# Patient Record
Sex: Female | Born: 1970
Health system: Southern US, Community
[De-identification: ages and names within clinical notes are randomized; demographics above are authoritative.]

## PROBLEM LIST (undated history)

## (undated) DIAGNOSIS — J189 Pneumonia, unspecified organism: Secondary | ICD-10-CM

## (undated) DIAGNOSIS — G4730 Sleep apnea, unspecified: Secondary | ICD-10-CM

## (undated) DIAGNOSIS — E119 Type 2 diabetes mellitus without complications: Secondary | ICD-10-CM

## (undated) DIAGNOSIS — I10 Essential (primary) hypertension: Secondary | ICD-10-CM

## (undated) HISTORY — DX: Essential (primary) hypertension: I10

## (undated) HISTORY — PX: KNEE ARTHROSCOPY: SHX127

## (undated) HISTORY — PX: BARIATRIC SURGERY: SHX1103

## (undated) HISTORY — PX: TONSILLECTOMY: SUR1361

## (undated) HISTORY — DX: Type 2 diabetes mellitus without complications: E11.9

---

## 1999-03-03 ENCOUNTER — Encounter: Payer: Self-pay | Admitting: Orthopedic Surgery

## 1999-03-03 ENCOUNTER — Ambulatory Visit (HOSPITAL_COMMUNITY): Admission: RE | Admit: 1999-03-03 | Discharge: 1999-03-03 | Payer: Self-pay | Admitting: Orthopedic Surgery

## 2002-09-22 ENCOUNTER — Other Ambulatory Visit: Admission: RE | Admit: 2002-09-22 | Discharge: 2002-09-22 | Payer: Self-pay | Admitting: Obstetrics and Gynecology

## 2002-09-28 ENCOUNTER — Ambulatory Visit (HOSPITAL_COMMUNITY): Admission: RE | Admit: 2002-09-28 | Discharge: 2002-09-28 | Payer: Self-pay | Admitting: Obstetrics and Gynecology

## 2002-09-28 ENCOUNTER — Encounter: Payer: Self-pay | Admitting: Obstetrics and Gynecology

## 2008-11-30 ENCOUNTER — Inpatient Hospital Stay (HOSPITAL_COMMUNITY): Admission: RE | Admit: 2008-11-30 | Discharge: 2008-11-30 | Payer: Self-pay | Admitting: Orthopedic Surgery

## 2008-12-20 ENCOUNTER — Encounter: Admission: RE | Admit: 2008-12-20 | Discharge: 2008-12-20 | Payer: Self-pay | Admitting: Orthopedic Surgery

## 2010-03-03 ENCOUNTER — Encounter: Payer: Self-pay | Admitting: Family Medicine

## 2010-05-15 LAB — BASIC METABOLIC PANEL WITH GFR
BUN: 11 mg/dL (ref 6–23)
CO2: 28 meq/L (ref 19–32)
Calcium: 9.7 mg/dL (ref 8.4–10.5)
Chloride: 103 meq/L (ref 96–112)
Creatinine, Ser: 0.68 mg/dL (ref 0.4–1.2)
GFR calc non Af Amer: >60 mL/min
Glucose, Bld: 154 mg/dL — ABNORMAL HIGH (ref 70–99)
Potassium: 4.3 meq/L (ref 3.5–5.1)
Sodium: 139 meq/L (ref 135–145)

## 2010-05-15 LAB — CBC
HCT: 37.5 % (ref 36.0–46.0)
Hemoglobin: 12.3 g/dL (ref 12.0–15.0)
MCV: 75.6 fL — ABNORMAL LOW (ref 78.0–100.0)
WBC: 11.3 10*3/uL — ABNORMAL HIGH (ref 4.0–10.5)

## 2010-05-15 LAB — GLUCOSE, CAPILLARY
Glucose-Capillary: 166 mg/dL — ABNORMAL HIGH (ref 70–99)
Glucose-Capillary: 173 mg/dL — ABNORMAL HIGH (ref 70–99)

## 2010-05-15 LAB — HCG, SERUM, QUALITATIVE: Preg, Serum: NEGATIVE

## 2011-08-03 ENCOUNTER — Other Ambulatory Visit: Payer: Self-pay | Admitting: Internal Medicine

## 2011-08-03 DIAGNOSIS — Z1231 Encounter for screening mammogram for malignant neoplasm of breast: Secondary | ICD-10-CM

## 2011-08-11 ENCOUNTER — Ambulatory Visit
Admission: RE | Admit: 2011-08-11 | Discharge: 2011-08-11 | Disposition: A | Discharge status: Home / Self Care | Payer: BC Managed Care – PPO | Source: Ambulatory Visit | Attending: Internal Medicine | Admitting: Internal Medicine

## 2011-08-11 DIAGNOSIS — Z1231 Encounter for screening mammogram for malignant neoplasm of breast: Secondary | ICD-10-CM

## 2011-10-08 ENCOUNTER — Ambulatory Visit (INDEPENDENT_AMBULATORY_CARE_PROVIDER_SITE_OTHER): Payer: BC Managed Care – PPO | Admitting: Emergency Medicine

## 2011-10-08 VITALS — BP 136/88 | HR 91 | Temp 97.6°F | Resp 16 | Ht 60.75 in | Wt 398.0 lb

## 2011-10-08 DIAGNOSIS — R0602 Shortness of breath: Secondary | ICD-10-CM

## 2011-10-08 DIAGNOSIS — F411 Generalized anxiety disorder: Secondary | ICD-10-CM

## 2011-10-08 DIAGNOSIS — F419 Anxiety disorder, unspecified: Secondary | ICD-10-CM

## 2011-10-08 MED ORDER — ALPRAZOLAM 0.5 MG PO TABS: ORAL_TABLET | ORAL | Status: DC

## 2011-10-08 NOTE — Progress Notes (Signed)
  Subjective:    Patient ID: Evelyn Wade, female    DOB: 1971-01-27, 41 y.o.   MRN: [id number]  HPI yesterday felt like when she would breath in couldn't get air. Felt like heart was beating harder but not faster. No leg pain or swelling. No recent travels. Has not had these sxs before. Does have sleep apnea and uses CPAP at night. Pulse was 80 last night. Has been exercising about a month and this morning it was harder to breath in but not out. Has lost 35 lbs since June.    Review of Systems     Objective:   Physical Exam patient does not appear in any distress. Neck is supple. Chest is clear to both auscultation and percussion. Cardiac exam reveals a regular rate without murmurs rubs or gallops. Calves are nontender.  EKG no acute changes      Assessment & Plan:  Not quite sure what the source of her episode last night was. It sounds more like an anxiety attack with her inability to take air in red been having wheezing and trouble expelling air. She does have a history of obstructive sleep apnea and is on the CPAP sheen. Her EKG which was done here is normal. Don't think any further workup is indicated at present unless this is a recurrent problem.

## 2011-10-08 NOTE — Patient Instructions (Signed)
Please return to clinic if you develop worsening symptoms or any recurrence of your symptoms. He is okay to take a Xanax if you feel stress coming on.

## 2011-10-08 NOTE — Progress Notes (Deleted)
  Subjective:    Patient ID: Evelyn Wade, female    DOB: 08/10/1970, 41 y.o.   MRN: [id number]  HPI    Review of Systems     Objective:   Physical Exam        Assessment & Plan:

## 2012-01-19 ENCOUNTER — Ambulatory Visit (INDEPENDENT_AMBULATORY_CARE_PROVIDER_SITE_OTHER): Payer: BC Managed Care – PPO | Admitting: Family Medicine

## 2012-01-19 VITALS — BP 119/81 | HR 74 | Temp 98.5°F | Resp 17 | Ht 61.0 in | Wt 367.0 lb

## 2012-01-19 DIAGNOSIS — H109 Unspecified conjunctivitis: Secondary | ICD-10-CM

## 2012-01-19 DIAGNOSIS — H5789 Other specified disorders of eye and adnexa: Secondary | ICD-10-CM

## 2012-01-19 DIAGNOSIS — Z8669 Personal history of other diseases of the nervous system and sense organs: Secondary | ICD-10-CM

## 2012-01-19 MED ORDER — CIPROFLOXACIN HCL 0.3 % OP SOLN: OPHTHALMIC | Status: DC

## 2012-01-19 NOTE — Patient Instructions (Signed)
Call your eye doctor tomorrow morning to be seen tomorrow as you have had a history of increased pressure and iritis. We will start an antibiotic today to cover for possible conjunctivitis, but your eye doctor can change this tomorrow if necessary. No contact lenses until authorized by your eye specialist.   Return to the clinic or go to the nearest emergency room if any of your symptoms worsen or new symptoms occur.

## 2012-01-19 NOTE — Progress Notes (Signed)
Subjective:    Patient ID: Evelyn Wade, female    DOB: 07-03-1970, 41 y.o.   MRN: [id number]  HPI Evelyn Wade is a 41 y.o. female  L eye redness - started with puffy area on upper lid 4 days ago, red yesterday, feels irritated. Blurry vision later yesterday.  More sore and blurry today.   Contacts and glasses - wears CL's about 3 days per week. Did wear them yesterday - then redness and irritation started. 36 week old contacts.  Took these out at 5pm.  No hx of ulceration. Usually does not wear same contacts more than few weeks. Had slight elevation in pressure in eyes at last optho visit 6 months ago.  Next appt in January.  Hx of iritis few years ago - unknown treatment other than steroid drops.   Optho: [facility] in past, more recently Dr. Mitzi Davenport.   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      Review of Systems  Constitutional: Negative for fever and chills.  HENT: Negative for congestion and rhinorrhea.   Eyes: Positive for photophobia (slight on right.), pain, redness and visual disturbance (slightly blurry on right.). Negative for discharge.  Respiratory: Negative for cough.        Objective:   Physical Exam  Vitals reviewed. Constitutional: She is oriented to person, place, and time. She appears well-developed and well-nourished. No distress.  HENT:  Head: Normocephalic and atraumatic.  Right Ear: Hearing, tympanic membrane, external ear and ear canal normal.  Left Ear: Hearing, tympanic membrane, external ear and ear canal normal.  Nose: Nose normal.  Mouth/Throat: Oropharynx is clear and moist. No oropharyngeal exudate.  Eyes: EOM and lids are normal. Pupils are equal, round, and reactive to light. No foreign bodies found. Left conjunctiva is injected. Left conjunctiva has no hemorrhage. Left eye exhibits normal extraocular motion.       Left eye with scleral injection. No visible crusting. Anterior chamber clear,  Proparacaine 2 gtts applied,with less soreness after gtts.  Lids everted, no foreign body identified. Fluorescein applied, no uptake.  Flushed with sterile water, no complications. No appreciable pain or discrepancy in firmness with closed eye palpation.    Cardiovascular: Normal rate, regular rhythm, normal heart sounds and intact distal pulses.   No murmur heard. Pulmonary/Chest: Effort normal and breath sounds normal. No respiratory distress. She has no wheezes. She has no rhonchi.  Neurological: She is alert and oriented to person, place, and time.  Skin: Skin is warm and dry. No rash noted.  Psychiatric: She has a normal mood and affect. Her behavior is normal.  reviewed vision testing.     Assessment & Plan:  Evelyn Wade is a 41 y.o. female 1. Conjunctivitis  ciprofloxacin (CILOXAN) 0.3 % ophthalmic solution  2. Red eye  ciprofloxacin (CILOXAN) 0.3 % ophthalmic solution  3. Hx of iritis     Hx of iritis and slight elevation of IOP prior.  Will have her call her eye specialist tomorrow morning to be seen.  No contact lenses at this point.  Cover for possible conjunctivitis with ciloxan for now.   Patient Instructions  Call your eye doctor tomorrow morning to be seen tomorrow as you have had a history of increased pressure and iritis. We will start an antibiotic today to cover for possible conjunctivitis, but your eye doctor can change this tomorrow if necessary. No contact lenses until authorized by your eye specialist.   Return to the clinic or go to the nearest emergency room if any of your  symptoms worsen or new symptoms occur.

## 2012-03-17 ENCOUNTER — Ambulatory Visit: Payer: BC Managed Care – PPO | Admitting: Obstetrics and Gynecology

## 2012-03-17 ENCOUNTER — Encounter: Payer: Self-pay | Admitting: Obstetrics and Gynecology

## 2012-03-17 ENCOUNTER — Other Ambulatory Visit: Payer: Self-pay

## 2012-03-17 VITALS — BP 140/90 | HR 72 | Ht 61.0 in | Wt 377.0 lb

## 2012-03-17 DIAGNOSIS — Z124 Encounter for screening for malignant neoplasm of cervix: Secondary | ICD-10-CM

## 2012-03-17 LAB — POCT URINE PREGNANCY: Preg Test, Ur: NEGATIVE

## 2012-03-17 MED ORDER — MEDROXYPROGESTERONE ACETATE 10 MG PO TABS: 10.0000 mg | ORAL_TABLET | Freq: Every day | ORAL | Status: DC

## 2012-03-17 MED ORDER — LABETALOL HCL 100 MG PO TABS: 100.0000 mg | ORAL_TABLET | Freq: Two times a day (BID) | ORAL | Status: DC

## 2012-03-17 NOTE — Progress Notes (Signed)
Last Pap: 09/02/10 WNL: Yes Regular Periods:no Contraception: none  Monthly Breast exam:yes somewhat Tetanus<14yrs:yes Nl.Bladder Function:yes Daily BMs:yes Healthy Diet:yes Calcium:no Mammogram:yes Date of Mammogram: 08/2011 Exercise:yes Have often Exercise: 3-4 days per week  Seatbelt: yes Abuse at home: no Stressful work:no Sigmoid-colonoscopy: n/a Bone Density: No PCP: Dr. Dorothyann Peng Change in PMH: no changes Change in [facility]: no changes BP 140/90  Pulse 72  Ht 5\' 1"  (1.549 m)  Wt 377 lb (171.006 kg)  BMI 71.23 kg/m2  LMP 02/10/2012 Pt with complaints:yes and pt desires pregnancy.  She has  Menses q 4- 6 months Physical Examination: General appearance - alert, well appearing, and in no distress Mental status - normal mood, behavior, speech, dress, motor activity, and thought processes Neck - supple, no significant adenopathy,  thyroid exam: thyroid is normal in size without nodules or tenderness Chest - clear to auscultation, no wheezes, rales or rhonchi, symmetric air entry Heart - normal rate and regular rhythm Abdomen - soft, nontender, nondistended, no masses or organomegaly Breasts - breasts appear normal, no suspicious masses, no skin or nipple changes or axillary nodes Pelvic - normal external genitalia, vulva, vagina, cervix, uterus and adnexa Rectal - rectal exam not indicated Back exam - full range of motion, no tenderness, palpable spasm or pain on motion Neurological - alert, oriented, normal speech, no focal findings or movement disorder noted Musculoskeletal - no joint tenderness, deformity or swelling Extremities - no edema, redness or tenderness in the calves or thighs Skin - normal coloration and turgor, no rashes, no suspicious skin lesions noted Routine exam Pap sent yes Mammogram due no none used for contraception RT to for day 21 progesterone Call with menses to schedule HSG and d21 progesterone SA done D/c lisionpril and start labetalol.   Check BP@NV  pnv given to pt

## 2012-03-17 NOTE — Patient Instructions (Addendum)
Take provera for five days Call with your menses to schedule HSG and Day 21 labs Schedule a day 35 visit [place] extension [phone] ext [phone]

## 2012-03-17 NOTE — Addendum Note (Signed)
Addended by: Rolla Plate on: 03/17/2012 04:37 PM   Modules accepted: Orders

## 2012-03-18 LAB — PAP IG W/ RFLX HPV ASCU

## 2012-03-23 ENCOUNTER — Telehealth: Payer: Self-pay

## 2012-03-23 MED ORDER — METRONIDAZOLE 500 MG PO TABS: 500.0000 mg | ORAL_TABLET | Freq: Two times a day (BID) | ORAL | Status: DC

## 2012-03-23 NOTE — Telephone Encounter (Signed)
Spoke wit pt informed pap wnl showed bv rx sent to pharm pt voice understanding

## 2012-03-23 NOTE — Telephone Encounter (Signed)
Message copied by Rolla Plate on Wed Mar 23, 2012 10:44 AM ------      Message from: Jaymes Graff      Created: Wed Mar 23, 2012 12:12 AM       Please tell pt BV was found on her pap smear.  She can be treated with either Metrogel one applicator in vagina QHS for five nights or Flagyl 500mg  one tablet twice a day for seven days. ------

## 2012-03-28 ENCOUNTER — Telehealth: Payer: Self-pay

## 2012-03-28 ENCOUNTER — Other Ambulatory Visit: Payer: Self-pay

## 2012-03-28 DIAGNOSIS — N970 Female infertility associated with anovulation: Secondary | ICD-10-CM

## 2012-03-28 NOTE — Telephone Encounter (Signed)
Message copied by Rolla Plate on Mon Mar 28, 2012 11:23 AM ------      Message from: Jaymes Graff      Created: Wed Mar 23, 2012  4:23 PM       Please tell pt BV was found on her pap smear.  She can be treated with either Metrogel one applicator in vagina QHS for five nights or Flagyl 500mg  one tablet twice a day for seven days. ------

## 2012-03-28 NOTE — Telephone Encounter (Signed)
rx sent to pharm

## 2012-03-28 NOTE — Telephone Encounter (Signed)
Spoke with pt rgd msg pt started cycle today pt schd d21 progesterone 04/18/12 at 4:oo HSG 2//24/14 at 8:[facility] pt voice understanding

## 2012-04-04 ENCOUNTER — Ambulatory Visit (HOSPITAL_COMMUNITY)
Admission: RE | Admit: 2012-04-04 | Discharge: 2012-04-04 | Disposition: A | Discharge status: Home / Self Care | Payer: BC Managed Care – PPO | Source: Ambulatory Visit | Attending: Obstetrics and Gynecology | Admitting: Obstetrics and Gynecology

## 2012-04-04 DIAGNOSIS — N979 Female infertility, unspecified: Secondary | ICD-10-CM | POA: Insufficient documentation

## 2012-04-04 DIAGNOSIS — N970 Female infertility associated with anovulation: Secondary | ICD-10-CM

## 2012-04-04 MED ORDER — IOHEXOL 300 MG/ML  SOLN
20.0000 mL | Freq: Once | INTRAMUSCULAR | Status: AC | PRN
  Administered 2012-04-04: 20 mL

## 2012-04-05 ENCOUNTER — Telehealth: Payer: Self-pay

## 2012-04-05 NOTE — Telephone Encounter (Signed)
Message copied by Rolla Plate on Tue Apr 05, 2012  2:53 PM ------      Message from: Jaymes Graff      Created: Tue Apr 05, 2012 12:29 PM       Please tell pt I will review this study with her at her next visit ------

## 2012-04-05 NOTE — Telephone Encounter (Signed)
Spoke with pt rgd results pt has appt 04/19/12 at 11:00 to discuss results pt voice understanding

## 2012-04-18 ENCOUNTER — Other Ambulatory Visit: Payer: BC Managed Care – PPO

## 2012-04-18 DIAGNOSIS — N970 Female infertility associated with anovulation: Secondary | ICD-10-CM

## 2012-04-19 ENCOUNTER — Ambulatory Visit: Payer: BC Managed Care – PPO | Admitting: Obstetrics and Gynecology

## 2012-04-19 ENCOUNTER — Encounter: Payer: Self-pay | Admitting: Obstetrics and Gynecology

## 2012-04-19 ENCOUNTER — Telehealth: Payer: Self-pay | Admitting: Obstetrics and Gynecology

## 2012-04-19 VITALS — BP 122/80 | Ht 60.0 in

## 2012-04-19 DIAGNOSIS — N970 Female infertility associated with anovulation: Secondary | ICD-10-CM

## 2012-04-19 LAB — PROGESTERONE: Progesterone: 0.4 ng/mL

## 2012-04-19 MED ORDER — CLOMIPHENE CITRATE 50 MG PO TABS: 50.0000 mg | ORAL_TABLET | Freq: Every day | ORAL | Status: DC

## 2012-04-19 NOTE — Patient Instructions (Signed)
Clomiphene tablets What is this medicine? CLOMIPHENE (KLOE mi feen) is a fertility drug that increases the chance of pregnancy. It helps women ovulate (produce a mature egg) during their cycle. This medicine may be used for other purposes; ask your health care provider or pharmacist if you have questions. What should I tell my health care provider before I take this medicine? They need to know if you have any of these conditions: -adrenal gland disease -blood vessel disease or blood clots -cyst on the ovary -endometriosis -liver disease -ovarian cancer -pituitary gland disease -vaginal bleeding that has not been evaluated -an unusual or allergic reaction to clomiphene, other medicines, foods, dyes, or preservatives -pregnant (should not be used if you are already pregnant) -breast-feeding How should I use this medicine? Take this medicine by mouth with a glass of water. Follow the directions on the prescription label. Take exactly as directed for the exact number of days prescribed. Take your doses at regular intervals. Most women take this medicine for a 5 day period, but the length of treatment may be adjusted. Your doctor will give you a start date for this medication and will give you instructions on proper use. Do not take your medicine more often than directed. Talk to your pediatrician regarding the use of this medicine in children. Special care may be needed. Overdosage: If you think you have taken too much of this medicine contact a poison control center or emergency room at once. NOTE: This medicine is only for you. Do not share this medicine with others. What if I miss a dose? If you miss a dose, take it as soon as you can. If it is almost time for your next dose, take only that dose. Do not take double or extra doses. What may interact with this medicine? -herbal or dietary supplements, like blue cohosh, black cohosh, chasteberry, or DHEA -prasterone This list may not describe  all possible interactions. Give your health care provider a list of all the medicines, herbs, non-prescription drugs, or dietary supplements you use. Also tell them if you smoke, drink alcohol, or use illegal drugs. Some items may interact with your medicine. What should I watch for while using this medicine? Make sure you understand how and when to use this medicine. You need to know when you are ovulating and when to have sexual intercourse. This will increase the chance of a pregnancy. Visit your doctor or health care professional for regular checks on your progress. You may need tests to check the hormone levels in your blood or you may have to use home-urine tests to check for ovulation. Try to keep any appointments. Compared to other fertility treatments, this medicine does not greatly increase your chances of having multiple babies. An increased chance of having twins may occur in roughly 5 out of every 100 women who take this medication. Stop taking this medicine at once and contact your doctor or health care professional if you think you are pregnant. This medicine is not for long-term use. Most women that benefit from this medicine do so within the first three cycles (months). Your doctor or health care professional will monitor your condition. This medicine is usually used for a total of 6 cycles of treatment. You may get drowsy or dizzy. Do not drive, use machinery, or do anything that needs mental alertness until you know how this drug affects you. Do not stand or sit up quickly. This reduces the risk of dizzy or fainting spells. Drinking alcoholic beverages   or smoking tobacco may decrease your chance of becoming pregnant. Limit or stop alcohol and tobacco use during your fertility treatments. What side effects may I notice from receiving this medicine? Side effects that you should report to your doctor or health care professional as soon as possible: -allergic reactions like skin rash,  itching or hives, swelling of the face, lips, or tongue -breathing problems -changes in vision -fluid retention -nausea, vomiting -pelvic pain or bloating -severe abdominal pain -sudden weight gain Side effects that usually do not require medical attention (report to your doctor or health care professional if they continue or are bothersome): -breast discomfort -hot flashes -mild pelvic discomfort -mild nausea This list may not describe all possible side effects. Call your doctor for medical advice about side effects. You may report side effects to FDA at 1-800-FDA-1088. Where should I keep my medicine? Keep out of the reach of children. Store at room temperature between 15 and 30 degrees C (59 and 86 degrees F). Protect from heat, light, and moisture. Throw away any unused medicine after the expiration date. NOTE: This sheet is a summary. It may not cover all possible information. If you have questions about this medicine, talk to your doctor, pharmacist, or health care provider.  2013, [employer]. (05/09/2007 10:21:06 PM)

## 2012-04-19 NOTE — Telephone Encounter (Signed)
Returned pt's call. LM to return call.   

## 2012-04-20 NOTE — Progress Notes (Signed)
Pt and her husband are here to discuss results.  Semen analysis is abnormal and significant for decreased motility and abnormal FP.  The patients labs are c/w anovulation.  Her HSG was normal with  Unknown spill from right tube.   BP 122/80  Ht 5' (1.524 m)  LMP 04/18/2012 Results for orders placed in visit on 04/18/12  PROGESTERONE      Result Value Range   Progesterone 0.4    infertility Anovulation and abnormal SA Referred her husband to urology for evaluation Pt desires to try clomid.  R&B reviewed.  Clomid 50 mg d3-d7 .  Check day 21 progesterone.  Once ovulatory they will consider IUI

## 2012-08-14 ENCOUNTER — Ambulatory Visit (INDEPENDENT_AMBULATORY_CARE_PROVIDER_SITE_OTHER): Payer: BC Managed Care – PPO | Admitting: Internal Medicine

## 2012-08-14 VITALS — BP 160/92 | HR 92 | Temp 98.0°F | Resp 17 | Ht 61.0 in | Wt >= 6400 oz

## 2012-08-14 DIAGNOSIS — E119 Type 2 diabetes mellitus without complications: Secondary | ICD-10-CM

## 2012-08-14 DIAGNOSIS — Z6841 Body Mass Index (BMI) 40.0 and over, adult: Secondary | ICD-10-CM | POA: Insufficient documentation

## 2012-08-14 DIAGNOSIS — K1120 Sialoadenitis, unspecified: Secondary | ICD-10-CM

## 2012-08-14 DIAGNOSIS — R221 Localized swelling, mass and lump, neck: Secondary | ICD-10-CM

## 2012-08-14 DIAGNOSIS — R220 Localized swelling, mass and lump, head: Secondary | ICD-10-CM

## 2012-08-14 LAB — POCT CBC
HCT, POC: 37.9 % (ref 37.7–47.9)
Hemoglobin: 11.5 g/dL — AB (ref 12.2–16.2)
MCH, POC: 23.3 pg — AB (ref 27–31.2)
MCV: 76.7 fL — AB (ref 80–97)
MPV: 7.5 fL (ref 0–99.8)
RBC: 4.94 M/uL (ref 4.04–5.48)
WBC: 9.8 10*3/uL (ref 4.6–10.2)

## 2012-08-14 MED ORDER — HYDROCODONE-ACETAMINOPHEN 5-325 MG PO TABS: 1.0000 | ORAL_TABLET | Freq: Four times a day (QID) | ORAL | Status: DC | PRN

## 2012-08-14 MED ORDER — AMOXICILLIN 500 MG PO CAPS: 1000.0000 mg | ORAL_CAPSULE | Freq: Two times a day (BID) | ORAL | Status: DC

## 2012-08-14 MED ORDER — CEFTRIAXONE SODIUM 1 G IJ SOLR
1.0000 g | INTRAMUSCULAR | Status: DC
  Administered 2012-08-14: 1 g via INTRAMUSCULAR

## 2012-08-14 NOTE — Patient Instructions (Addendum)
Sialadenitis  Sialadenitis is an inflammation (soreness) of the salivary glands. The parotid is the main salivary gland. It lies behind the angle of the jaw below the ear. The saliva produced comes out of a tiny opening (duct) inside the cheek on either side. This is usually at the level of the upper back teeth. If it is swollen, the ear is pushed up and out. This helps tell this condition apart from a simple lymph gland infection (swollen glands) in the same area. Mumps has mostly disappeared since the start of immunization against mumps. Now the most common cause of parotitis is germ (bacterial) infection or inflammation of the lymphatics (the lymph channels). The other major salivary gland is located in the floor of the mouth. Smaller salivary glands are located in the mouth. This includes the:   Lips.   Lining of the mouth.   Pharynx.   Hard palate (front part of the roof of the mouth).  The salivary glands do many things, including:   Lubrication.   Breaking down food.   Production of hormones and antibodies (to protect against germs which may cause illness).   Help with the sense of taste.  ACUTE BACTERIAL SIALADENITIS  This is a sudden inflammatory response to bacterial infection. This causes redness, pain, swelling and tenderness over the infected gland. In the past, it was common in dehydrated and debilitated patients often following an operation. It is now more commonly seen:   After radiotherapy.   In patients with poor immune systems.  Treatment is:   The correction of fluid balance (rehydration).   Medicine that kill germs (antibiotics).   Pain relief.  CHRONIC RECURRENT SIALADENITIS  This refers to repeated episodes of discomfort and swelling of one of the salivary glands. It often occurs after eating. Chronic sialadenitis is usually less painful. It is associated with recurrent enlargement of a salivary gland, often following meals, and typically with an absence of redness. The chronic  form of the disease often is associated with conditions linked to decreased salivary flow, rather than dehydration (loss of body fluids). These conditions include:   A stone, or concretion, formed in the gallbladder, kidneys, or other parts of the body (calculi).   Salivary stasis.   A change in the fluid and electrolyte (the salts in your body fluids) makeup of the gland.  It is treated with:   Gland massage.   Methods to stimulate the flow of saliva, (for example, lemon juice).   Antibiotics if required.  Surgery to remove the gland is possible, but its benefits need to be balanced against risks.   VIRAL SIALADENITIS  Several viruses infect the salivary glands. Some of these include the mumps virus that commonly infects the parotid gland. Other viruses causing problems are:   The HIV virus.   Herpes.   Some of the influenza ("flu") viruses.  RECURRENT SIALADENITIS IN CHILDREN  This condition is thought to be due to swelling or ballooning of the ducts. It results in the same symptoms as acute bacterial parotitis. It is usually caused by germs (bacteria). It is often treated using penicillin. It may get well without treatment. Surgery is usually not required.  TUBERCULOUS SIALADENITIS  The salivary glands may become infected with the same bacteria causing tuberculosis ("TB"). Treatment is with anti-tuberculous antibiotic therapy.  OTHER UNCOMMON CAUSES OF SIALADENITIS    Sjogren's syndrome is a condition in which arthritis is associated with a decrease in activity of the glands of the body that produce saliva   enlargement.  Atypical mycobacteria is a germ similar to tuberculosis. It often infects children. It is often resistant to antibiotic treatment. It may require surgical treatment to remove  the infected salivary gland.  Actinomycosis is an infection of the parotid gland that may also involve the overlying skin. The diagnosis is made by detecting granules of sulphur produced by the bacteria on microscopic examination. Treatment is a prolonged course of penicillin for up to one year.  Nutritional causes include vitamin deficiencies and bulimia.  Diabetes and problems with your thyroid.  Obesity, cirrhosis, and malabsorption are some metabolic causes. HOME CARE INSTRUCTIONS   Apply ice bags every 2 hours for 15-20 minutes, while awake, to the sore gland for 24 hours, then as directed by your caregiver. Place the ice in a plastic bag with a towel around it to prevent frostbite to the skin.  Only take over-the-counter or prescription medicines for pain, discomfort, or fever as directed by your caregiver. SEEK IMMEDIATE MEDICAL CARE IF:   There is increased pain or swelling in your gland that is not controlled with medicine.  An oral temperature above 102 F (38.9 C) develops, not controlled by medicine.  You develop difficulty opening your mouth, swallowing, or speaking. Document Released: 07/18/2001 Document Revised: 04/20/2011 Document Reviewed: 09/12/2007 [facility] Patient Information 2014 [place], [place]. Salivary Gland Infection A salivary gland infection can be caused by a virus, bacteria from the mouth, or a stone. Mumps and other viruses may settle in one or more of the saliva glands. This will result in swelling, pain, and difficulty eating. Bacteria may cause a more severe infection in a salivary gland. A salivary stone blocking the flow of saliva can make this worse. These infections may be related to other medical problems. Some of these are dehydration, recent surgery, poor nutrition, and some medications. TREATMENT  Treatment of a salivary gland infection depends on the cause. Mumps and other virus infections do not require antibiotics. If bacteria cause the  infection, then antibiotics are needed to get rid of the infection. If there is a salivary stone blocking the duct, minor surgery to remove the stone may be needed.  HOME CARE INSTRUCTIONS   Get plenty of rest, increase your fluids, and use warm compresses on the swollen area for 15 to 20 minutes 4 times per day or as often as feels good to you.  Suck on hard candy or chew sugarless gum to promote saliva production.  Only take over-the-counter or prescription medicines for pain, discomfort, or fever as directed by your caregiver. SEEK IMMEDIATE MEDICAL CARE IF:   You have increased swelling or pain or pain not relieved with medications.  You develop chills or a fever.  Any of your problems are getting worse rather than better. Document Released: 03/05/2004 Document Revised: 04/20/2011 Document Reviewed: 01/26/2005 [facility] Patient Information 2014 [place], [place].

## 2012-08-14 NOTE — Progress Notes (Signed)
  Subjective:    Patient ID: Evelyn Wade, female    DOB: 1970/12/13, 42 y.o.   MRN: [id number]  HPI Feels terrible, 3 weeks of right submandibular swelling progressive and pain. No fever, has diabetes and obesity. Txed with keflex for 5 d getting worse, never had this problem before.   Review of Systems     Objective:   Physical Exam  Vitals reviewed. Constitutional: She is oriented to person, place, and time. She appears distressed.  HENT:  Right Ear: External ear normal.  Left Ear: External ear normal.  Nose: Nose normal.  Mouth/Throat: Oropharynx is clear and moist.  Neck: Normal range of motion. No thyromegaly present.  Cardiovascular: Normal rate.   Pulmonary/Chest: Effort normal.  Lymphadenopathy:       Head (left side): Submandibular and preauricular adenopathy present.    She has cervical adenopathy.       Left cervical: Deep cervical adenopathy present.  Neurological: She is alert and oriented to person, place, and time. No cranial nerve deficit. She exhibits normal muscle tone. Coordination normal.  Skin: Skin is warm. No rash noted.  Psychiatric: She has a normal mood and affect.   Results for orders placed in visit on 08/14/12  POCT CBC      Result Value Range   WBC 9.8  4.6 - 10.2 K/uL   Lymph, poc 2.4  0.6 - 3.4   POC LYMPH PERCENT 24.7  10 - 50 %L   MID (cbc) 0.5  0 - 0.9   POC MID % 5.5  0 - 12 %M   POC Granulocyte 6.8  2 - 6.9   Granulocyte percent 69.8  37 - 80 %G   RBC 4.94  4.04 - 5.48 M/uL   Hemoglobin 11.5 (*) 12.2 - 16.2 g/dL   HCT, POC 40.9  81.1 - 47.9 %   MCV 76.7 (*) 80 - 97 fL   MCH, POC 23.3 (*) 27 - 31.2 pg   MCHC 30.3 (*) 31.8 - 35.4 g/dL   RDW, POC 91.4     Platelet Count, POC 478 (*) 142 - 424 K/uL   MPV 7.5  0 - 99.8 fL  GLUCOSE, POCT (MANUAL RESULT ENTRY)      Result Value Range   POC Glucose 115 (*) 70 - 99 mg/dl          Assessment & Plan:  Lymphadenitis versus sialadenitis left submandibular and /or  parotid Rocephin/amoxil/vicodin

## 2012-08-23 ENCOUNTER — Other Ambulatory Visit (INDEPENDENT_AMBULATORY_CARE_PROVIDER_SITE_OTHER): Payer: Self-pay | Admitting: Otolaryngology

## 2012-08-23 DIAGNOSIS — R220 Localized swelling, mass and lump, head: Secondary | ICD-10-CM

## 2012-08-25 ENCOUNTER — Ambulatory Visit
Admission: RE | Admit: 2012-08-25 | Discharge: 2012-08-25 | Disposition: A | Discharge status: Home / Self Care | Payer: BC Managed Care – PPO | Source: Ambulatory Visit | Attending: Otolaryngology | Admitting: Otolaryngology

## 2012-08-25 ENCOUNTER — Other Ambulatory Visit: Payer: BC Managed Care – PPO

## 2012-08-25 ENCOUNTER — Other Ambulatory Visit (INDEPENDENT_AMBULATORY_CARE_PROVIDER_SITE_OTHER): Payer: Self-pay | Admitting: Otolaryngology

## 2012-08-25 DIAGNOSIS — R221 Localized swelling, mass and lump, neck: Secondary | ICD-10-CM

## 2012-08-25 DIAGNOSIS — R220 Localized swelling, mass and lump, head: Secondary | ICD-10-CM

## 2012-08-25 LAB — BUN: BUN: 19 mg/dL (ref 6–23)

## 2012-08-25 LAB — CREATININE, SERUM: Creat: 0.8 mg/dL (ref 0.50–1.10)

## 2012-08-25 MED ORDER — IOHEXOL 300 MG/ML  SOLN
100.0000 mL | Freq: Once | INTRAMUSCULAR | Status: AC | PRN
  Administered 2012-08-25: 100 mL via INTRAVENOUS

## 2012-09-02 ENCOUNTER — Other Ambulatory Visit: Payer: Self-pay | Admitting: Otolaryngology

## 2012-09-05 ENCOUNTER — Encounter (HOSPITAL_COMMUNITY): Payer: Self-pay | Admitting: Pharmacy Technician

## 2012-09-06 ENCOUNTER — Encounter (HOSPITAL_COMMUNITY)
Admission: RE | Admit: 2012-09-06 | Discharge: 2012-09-06 | Disposition: A | Discharge status: Home / Self Care | Payer: BC Managed Care – PPO | Source: Ambulatory Visit | Attending: Otolaryngology | Admitting: Otolaryngology

## 2012-09-06 ENCOUNTER — Encounter (HOSPITAL_COMMUNITY): Payer: Self-pay

## 2012-09-06 ENCOUNTER — Encounter (HOSPITAL_COMMUNITY)
Admission: RE | Admit: 2012-09-06 | Discharge: 2012-09-06 | Disposition: A | Discharge status: Home / Self Care | Payer: BC Managed Care – PPO | Source: Ambulatory Visit | Attending: Anesthesiology | Admitting: Anesthesiology

## 2012-09-06 HISTORY — DX: Sleep apnea, unspecified: G47.30

## 2012-09-06 HISTORY — DX: Pneumonia, unspecified organism: J18.9

## 2012-09-06 LAB — BASIC METABOLIC PANEL
CO2: 24 mEq/L (ref 19–32)
Chloride: 106 mEq/L (ref 96–112)
Creatinine, Ser: 0.64 mg/dL (ref 0.50–1.10)
GFR calc Af Amer: >90 mL/min (ref >90)
Potassium: 4 mEq/L (ref 3.5–5.1)

## 2012-09-06 LAB — CBC
HCT: 33.7 % — ABNORMAL LOW (ref 36.0–46.0)
Hemoglobin: 10.6 g/dL — ABNORMAL LOW (ref 12.0–15.0)
MCV: 73.3 fL — ABNORMAL LOW (ref 78.0–100.0)
RDW: 16.3 % — ABNORMAL HIGH (ref 11.5–15.5)
WBC: 10.1 10*3/uL (ref 4.0–10.5)

## 2012-09-06 NOTE — Pre-Procedure Instructions (Addendum)
Evelyn Wade  09/06/2012   Your procedure is scheduled on: 7.30.14  Report to [facility] at 745 AM.  Call this number if you have problems the morning of surgery: [phone]   Remember:   Do not eat food or drink liquids after midnight.   Take these medicines the morning of surgery with A SIP OF WATER:pain med, labetalol, clindamycin (if doesn't cause nausea when taken on empty stomach)               STOP ibuprofen   No diabetic meds am of surgery   Do not wear jewelry, make-up or nail polish.  Do not wear lotions, powders, or perfumes. You may wear deodorant.  Do not shave 48 hours prior to surgery. Men may shave face and neck.  Do not bring valuables to the hospital.  [facility] is not responsible                   for any belongings or valuables.  Contacts, dentures or bridgework may not be worn into surgery.  Leave suitcase in the car. After surgery it may be brought to your room.  For patients admitted to the hospital, checkout time is 11:00 AM the day of  discharge.   Patients discharged the day of surgery will not be allowed to drive  home.  Name and phone number of your driver:  Special Instructions: Shower using CHG 2 nights before surgery and the night before surgery.  If you shower the day of surgery use CHG.  Use special wash - you have one bottle of CHG for all showers.  You should use approximately 1/3 of the bottle for each shower.   Please read over the following fact sheets that you were given: Pain Booklet, Coughing and Deep Breathing and Surgical Site Infection Prevention

## 2012-09-07 ENCOUNTER — Encounter (HOSPITAL_COMMUNITY): Payer: Self-pay | Admitting: Anesthesiology

## 2012-09-07 ENCOUNTER — Encounter (HOSPITAL_COMMUNITY)
Admission: RE | Disposition: A | Discharge status: Home / Self Care | Payer: Self-pay | Source: Ambulatory Visit | Attending: Otolaryngology

## 2012-09-07 ENCOUNTER — Ambulatory Visit (HOSPITAL_COMMUNITY)
Admission: RE | Admit: 2012-09-07 | Discharge: 2012-09-07 | Disposition: A | Discharge status: Home / Self Care | Payer: BC Managed Care – PPO | Source: Ambulatory Visit | Attending: Otolaryngology | Admitting: Otolaryngology

## 2012-09-07 ENCOUNTER — Ambulatory Visit (HOSPITAL_COMMUNITY): Payer: BC Managed Care – PPO | Admitting: Anesthesiology

## 2012-09-07 DIAGNOSIS — K1120 Sialoadenitis, unspecified: Secondary | ICD-10-CM

## 2012-09-07 DIAGNOSIS — Z8701 Personal history of pneumonia (recurrent): Secondary | ICD-10-CM | POA: Insufficient documentation

## 2012-09-07 DIAGNOSIS — Z6841 Body Mass Index (BMI) 40.0 and over, adult: Secondary | ICD-10-CM | POA: Insufficient documentation

## 2012-09-07 DIAGNOSIS — G4733 Obstructive sleep apnea (adult) (pediatric): Secondary | ICD-10-CM | POA: Insufficient documentation

## 2012-09-07 DIAGNOSIS — E119 Type 2 diabetes mellitus without complications: Secondary | ICD-10-CM | POA: Insufficient documentation

## 2012-09-07 DIAGNOSIS — Z79899 Other long term (current) drug therapy: Secondary | ICD-10-CM | POA: Insufficient documentation

## 2012-09-07 DIAGNOSIS — K115 Sialolithiasis: Secondary | ICD-10-CM | POA: Insufficient documentation

## 2012-09-07 DIAGNOSIS — I10 Essential (primary) hypertension: Secondary | ICD-10-CM | POA: Insufficient documentation

## 2012-09-07 HISTORY — PX: SUBMANDIBULAR GLAND EXCISION: SHX2456

## 2012-09-07 LAB — GLUCOSE, CAPILLARY
Glucose-Capillary: 102 mg/dL — ABNORMAL HIGH (ref 70–99)
Glucose-Capillary: 142 mg/dL — ABNORMAL HIGH (ref 70–99)

## 2012-09-07 SURGERY — EXCISION, SUBMANDIBULAR GLAND
Anesthesia: General | Site: Neck | Laterality: Left | Wound class: Clean

## 2012-09-07 MED ORDER — 0.9 % SODIUM CHLORIDE (POUR BTL) OPTIME
TOPICAL | Status: DC | PRN
  Administered 2012-09-07: 1000 mL

## 2012-09-07 MED ORDER — LIDOCAINE-EPINEPHRINE 1 %-1:100000 IJ SOLN
INTRAMUSCULAR | Status: DC | PRN
  Administered 2012-09-07: 4 mL

## 2012-09-07 MED ORDER — FENTANYL CITRATE 0.05 MG/ML IJ SOLN
INTRAMUSCULAR | Status: AC
  Filled 2012-09-07: qty 2

## 2012-09-07 MED ORDER — PROPOFOL 10 MG/ML IV BOLUS
INTRAVENOUS | Status: DC | PRN
  Administered 2012-09-07: 30 mg via INTRAVENOUS
  Administered 2012-09-07: 50 mg via INTRAVENOUS
  Administered 2012-09-07: 20 mg via INTRAVENOUS
  Administered 2012-09-07: 200 mg via INTRAVENOUS

## 2012-09-07 MED ORDER — OXYCODONE-ACETAMINOPHEN 5-325 MG PO TABS: 1.0000 | ORAL_TABLET | ORAL | Status: DC | PRN

## 2012-09-07 MED ORDER — ONDANSETRON HCL 4 MG/2ML IJ SOLN
INTRAMUSCULAR | Status: DC | PRN
  Administered 2012-09-07: 4 mg via INTRAVENOUS

## 2012-09-07 MED ORDER — CEFAZOLIN SODIUM 1-5 GM-% IV SOLN
INTRAVENOUS | Status: AC
  Filled 2012-09-07: qty 150

## 2012-09-07 MED ORDER — OXYCODONE HCL 5 MG/5ML PO SOLN
5.0000 mg | Freq: Once | ORAL | Status: AC | PRN
  Administered 2012-09-07: 5 mg via ORAL

## 2012-09-07 MED ORDER — LIDOCAINE HCL (CARDIAC) 20 MG/ML IV SOLN
INTRAVENOUS | Status: DC | PRN
  Administered 2012-09-07: 100 mg via INTRAVENOUS

## 2012-09-07 MED ORDER — MICROFIBRILLAR COLL HEMOSTAT EX PADS
MEDICATED_PAD | CUTANEOUS | Status: DC | PRN
  Administered 2012-09-07: 1 via TOPICAL

## 2012-09-07 MED ORDER — OXYCODONE HCL 5 MG PO TABS: 5.0000 mg | ORAL_TABLET | Freq: Once | ORAL | Status: AC | PRN

## 2012-09-07 MED ORDER — MIDAZOLAM HCL 5 MG/5ML IJ SOLN
INTRAMUSCULAR | Status: DC | PRN
  Administered 2012-09-07 (×2): 1 mg via INTRAVENOUS

## 2012-09-07 MED ORDER — BACITRACIN ZINC 500 UNIT/GM EX OINT
TOPICAL_OINTMENT | CUTANEOUS | Status: AC
  Filled 2012-09-07: qty 15

## 2012-09-07 MED ORDER — ALBUTEROL SULFATE HFA 108 (90 BASE) MCG/ACT IN AERS
INHALATION_SPRAY | RESPIRATORY_TRACT | Status: DC | PRN
  Administered 2012-09-07: 2 via RESPIRATORY_TRACT

## 2012-09-07 MED ORDER — SUCCINYLCHOLINE CHLORIDE 20 MG/ML IJ SOLN
INTRAMUSCULAR | Status: DC | PRN
  Administered 2012-09-07: 40 mg via INTRAVENOUS
  Administered 2012-09-07: 80 mg via INTRAVENOUS

## 2012-09-07 MED ORDER — ARTIFICIAL TEARS OP OINT
TOPICAL_OINTMENT | OPHTHALMIC | Status: DC | PRN
  Administered 2012-09-07: 1 via OPHTHALMIC

## 2012-09-07 MED ORDER — FENTANYL CITRATE 0.05 MG/ML IJ SOLN
INTRAMUSCULAR | Status: DC | PRN
  Administered 2012-09-07 (×6): 50 ug via INTRAVENOUS
  Administered 2012-09-07: 100 ug via INTRAVENOUS

## 2012-09-07 MED ORDER — LACTATED RINGERS IV SOLN
INTRAVENOUS | Status: DC
  Administered 2012-09-07: 20 mL/h via INTRAVENOUS

## 2012-09-07 MED ORDER — NEOSTIGMINE METHYLSULFATE 1 MG/ML IJ SOLN
INTRAMUSCULAR | Status: DC | PRN
  Administered 2012-09-07: 5 mg via INTRAVENOUS

## 2012-09-07 MED ORDER — DEXTROSE 5 % IV SOLN
3.0000 g | Freq: Once | INTRAVENOUS | Status: AC
  Administered 2012-09-07: 3 g via INTRAVENOUS

## 2012-09-07 MED ORDER — SODIUM CHLORIDE 0.9 % IJ SOLN
INTRAMUSCULAR | Status: AC
  Filled 2012-09-07: qty 6

## 2012-09-07 MED ORDER — ROCURONIUM BROMIDE 100 MG/10ML IV SOLN
INTRAVENOUS | Status: DC | PRN
  Administered 2012-09-07: 10 mg via INTRAVENOUS
  Administered 2012-09-07: 40 mg via INTRAVENOUS
  Administered 2012-09-07 (×3): 10 mg via INTRAVENOUS

## 2012-09-07 MED ORDER — DEXAMETHASONE SODIUM PHOSPHATE 4 MG/ML IJ SOLN
INTRAMUSCULAR | Status: DC | PRN
  Administered 2012-09-07: 4 mg via INTRAVENOUS

## 2012-09-07 MED ORDER — GLYCOPYRROLATE 0.2 MG/ML IJ SOLN
INTRAMUSCULAR | Status: DC | PRN
  Administered 2012-09-07: .8 mg via INTRAVENOUS

## 2012-09-07 MED ORDER — OXYCODONE HCL 5 MG/5ML PO SOLN
ORAL | Status: AC
  Filled 2012-09-07: qty 5

## 2012-09-07 MED ORDER — METOCLOPRAMIDE HCL 5 MG/ML IJ SOLN
INTRAMUSCULAR | Status: DC | PRN
  Administered 2012-09-07: 10 mg via INTRAVENOUS

## 2012-09-07 MED ORDER — LACTATED RINGERS IV SOLN
INTRAVENOUS | Status: DC | PRN
  Administered 2012-09-07 (×2): via INTRAVENOUS

## 2012-09-07 MED ORDER — FENTANYL CITRATE 0.05 MG/ML IJ SOLN
25.0000 ug | INTRAMUSCULAR | Status: DC | PRN
  Administered 2012-09-07 (×3): 50 ug via INTRAVENOUS

## 2012-09-07 MED ORDER — METOCLOPRAMIDE HCL 5 MG/ML IJ SOLN: 10.0000 mg | Freq: Once | INTRAMUSCULAR | Status: DC | PRN

## 2012-09-07 MED ORDER — AMOXICILLIN-POT CLAVULANATE 875-125 MG PO TABS: 1.0000 | ORAL_TABLET | Freq: Two times a day (BID) | ORAL | Status: AC

## 2012-09-07 SURGICAL SUPPLY — 42 items
ATTRACTOMAT 16X20 MAGNETIC DRP (DRAPES) ×2 IMPLANT
CANISTER SUCTION 2500CC (MISCELLANEOUS) ×2 IMPLANT
CLEANER TIP ELECTROSURG 2X2 (MISCELLANEOUS) ×2 IMPLANT
CLOTH BEACON ORANGE TIMEOUT ST (SAFETY) ×2 IMPLANT
CONT SPEC 4OZ CLIKSEAL STRL BL (MISCELLANEOUS) ×2 IMPLANT
CORDS BIPOLAR (ELECTRODE) ×2 IMPLANT
DERMABOND ADVANCED (GAUZE/BANDAGES/DRESSINGS) ×2
DERMABOND ADVANCED .7 DNX12 (GAUZE/BANDAGES/DRESSINGS) ×2 IMPLANT
DRAIN CHANNEL 7F FF FLAT (WOUND CARE) IMPLANT
DRAPE SURG 17X23 STRL (DRAPES) ×2 IMPLANT
ELECT COATED BLADE 2.86 ST (ELECTRODE) ×2 IMPLANT
ELECT REM PT RETURN 9FT ADLT (ELECTROSURGICAL) ×2
ELECTRODE REM PT RTRN 9FT ADLT (ELECTROSURGICAL) ×1 IMPLANT
EVACUATOR SILICONE 100CC (DRAIN) IMPLANT
GAUZE SPONGE 4X4 16PLY XRAY LF (GAUZE/BANDAGES/DRESSINGS) ×6 IMPLANT
GLOVE BIO SURGEON STRL SZ 6.5 (GLOVE) ×4 IMPLANT
GLOVE BIOGEL M 7.0 STRL (GLOVE) ×2 IMPLANT
GLOVE BIOGEL PI IND STRL 7.0 (GLOVE) ×1 IMPLANT
GLOVE BIOGEL PI IND STRL 7.5 (GLOVE) ×1 IMPLANT
GLOVE BIOGEL PI INDICATOR 7.0 (GLOVE) ×1
GLOVE BIOGEL PI INDICATOR 7.5 (GLOVE) ×1
GLOVE ECLIPSE 7.5 STRL STRAW (GLOVE) ×4 IMPLANT
GOWN STRL NON-REIN LRG LVL3 (GOWN DISPOSABLE) ×4 IMPLANT
HEMOSTAT SURGICEL 2X14 (HEMOSTASIS) ×2 IMPLANT
KIT BASIN OR (CUSTOM PROCEDURE TRAY) ×2 IMPLANT
KIT ROOM TURNOVER OR (KITS) ×2 IMPLANT
LOCATOR NERVE 3 VOLT (DISPOSABLE) IMPLANT
NS IRRIG 1000ML POUR BTL (IV SOLUTION) ×2 IMPLANT
PAD ARMBOARD 7.5X6 YLW CONV (MISCELLANEOUS) ×4 IMPLANT
PENCIL BUTTON HOLSTER BLD 10FT (ELECTRODE) ×2 IMPLANT
SHEARS HARMONIC 9CM CVD (BLADE) ×2 IMPLANT
SPECIMEN JAR SMALL (MISCELLANEOUS) ×2 IMPLANT
STAPLER VISISTAT 35W (STAPLE) ×2 IMPLANT
SUT ETHILON 3 0 PS 1 (SUTURE) IMPLANT
SUT ETHILON 5 0 P 3 18 (SUTURE)
SUT NYLON ETHILON 5-0 P-3 1X18 (SUTURE) IMPLANT
SUT SILK 2 0 (SUTURE) ×1
SUT SILK 2-0 18XBRD TIE 12 (SUTURE) ×1 IMPLANT
SUT SILK 3 0 REEL (SUTURE) ×2 IMPLANT
SUT VIC AB 3-0 FS2 27 (SUTURE) IMPLANT
SUT VICRYL 4-0 PS2 18IN ABS (SUTURE) ×2 IMPLANT
TRAY ENT MC OR (CUSTOM PROCEDURE TRAY) ×2 IMPLANT

## 2012-09-07 NOTE — Progress Notes (Signed)
Report to Joyce Gross, RN (pacu handoff)

## 2012-09-07 NOTE — Anesthesia Postprocedure Evaluation (Signed)
Anesthesia Post Note  Patient: Evelyn Wade  Procedure(s) Performed: Procedure(s) (LRB): EXCISION SUBMANDIBULAR GLAND (Left)  Anesthesia type: General  Patient location: PACU  Post pain: Pain level controlled  Post assessment: Patient's Cardiovascular Status Stable  Last Vitals:  Filed Vitals:   09/07/12 1530  BP: 168/88  Pulse:   Temp:   Resp:     Post vital signs: Reviewed and stable  Level of consciousness: alert  Complications: No apparent anesthesia complications

## 2012-09-07 NOTE — Transfer of Care (Signed)
Immediate Anesthesia Transfer of Care Note  Patient: Evelyn Wade  Procedure(s) Performed: Procedure(s) with comments: EXCISION SUBMANDIBULAR GLAND (Left) - Excision Submandibular Gland  Patient Location: PACU  Anesthesia Type:General  Level of Consciousness: awake, alert  and oriented  Airway & Oxygen Therapy: Patient Spontanous Breathing and Patient connected to face mask oxygen  Post-op Assessment: Report given to PACU RN, Post -op Vital signs reviewed and stable and Patient moving all extremities X 4  Post vital signs: Reviewed and stable  Complications: No apparent anesthesia complications

## 2012-09-07 NOTE — Preoperative (Signed)
Beta Blockers   Reason not to administer Beta Blockers:Not Applicable 

## 2012-09-07 NOTE — Anesthesia Preprocedure Evaluation (Signed)
Anesthesia Evaluation  Patient identified by MRN, date of birth, ID band Patient awake    Reviewed: Allergy & Precautions, H&P , NPO status , Patient's Chart, lab work & pertinent test results, reviewed documented beta blocker date and time   Airway Mallampati: II TM Distance: >3 FB Neck ROM: full    Dental   Pulmonary sleep apnea , pneumonia -, resolved,  breath sounds clear to auscultation        Cardiovascular hypertension, On Medications and On Home Beta Blockers Rhythm:regular     Neuro/Psych negative neurological ROS  negative psych ROS   GI/Hepatic negative GI ROS, Neg liver ROS,   Endo/Other  diabetes, Oral Hypoglycemic AgentsMorbid obesity  Renal/GU negative Renal ROS  negative genitourinary   Musculoskeletal   Abdominal   Peds  Hematology negative hematology ROS (+)   Anesthesia Other Findings See surgeon's H&P   Reproductive/Obstetrics negative OB ROS                           Anesthesia Physical Anesthesia Plan  ASA: III  Anesthesia Plan: General   Post-op Pain Management:    Induction: Intravenous  Airway Management Planned: Oral ETT and Video Laryngoscope Planned  Additional Equipment:   Intra-op Plan:   Post-operative Plan: Extubation in OR  Informed Consent: I have reviewed the patients History and Physical, chart, labs and discussed the procedure including the risks, benefits and alternatives for the proposed anesthesia with the patient or authorized representative who has indicated his/her understanding and acceptance.   Dental Advisory Given  Plan Discussed with: CRNA and Surgeon  Anesthesia Plan Comments:         Anesthesia Quick Evaluation

## 2012-09-07 NOTE — H&P (Signed)
  H&P Update  Pt's original H&P dated 08/29/12 reviewed and placed in chart (to be scanned).  I personally examined the patient today.  No change in health. Proceed with left submandibular gland excision.

## 2012-09-07 NOTE — Anesthesia Procedure Notes (Signed)
Procedure Name: Intubation Date/Time: 09/07/2012 10:07 AM Performed by: Gayla Medicus Pre-anesthesia Checklist: Patient identified, Timeout performed, Emergency Drugs available, Suction available and Patient being monitored Patient Re-evaluated:Patient Re-evaluated prior to inductionOxygen Delivery Method: Circle system utilized Preoxygenation: Pre-oxygenation with 100% oxygen Intubation Type: IV induction Ventilation: Mask ventilation with difficulty and Oral airway inserted - appropriate to patient size Laryngoscope Size: Mac and 4 Grade View: Grade I Tube type: Oral Tube size: 7.5 mm Number of attempts: 2 Airway Equipment and Method: Stylet and Video-laryngoscopy Placement Confirmation: ETT inserted through vocal cords under direct vision,  positive ETCO2 and breath sounds checked- equal and bilateral Secured at: 22 cm Tube secured with: Tape Dental Injury: Teeth and Oropharynx as per pre-operative assessment  Difficulty Due To: Difficulty was anticipated Future Recommendations: Recommend- induction with short-acting agent, and alternative techniques readily available Comments: First attempt with glidescope unsuccessful d/t inability to obtain adequate view. Patient masked with difficulty, LMA inserted as a bridge to second DL attempt to recover O2 sats per Dr. Gelene Mink. Second attempt with glidescope successful, +ETCO2, +BBSE. O2 sats 99% after intubation, VSS, no trauma noted. KH

## 2012-09-07 NOTE — Brief Op Note (Signed)
09/07/2012  11:51 AM  PATIENT:  Evelyn Wade  42 y.o. female  PRE-OPERATIVE DIAGNOSIS:  SUBMANDIBULAR SIALOADENITIS AND SIALOLITHIASIS  POST-OPERATIVE DIAGNOSIS:  SUBMANDIBULAR SIALOADENITIS AND SIALOLITHIASIS  PROCEDURE:  Procedure(s): EXCISION SUBMANDIBULAR GLAND (Left)  SURGEON:  Surgeon(s) and Role:    * Darletta Moll, MD - Primary  PHYSICIAN ASSISTANT: Roma Schanz, PA-C  ASSISTANTS: Roma Schanz, PA-C   ANESTHESIA:   general  EBL:  Total I/O In: 1000 [I.V.:1000] Out: 50 [Blood:50]  BLOOD ADMINISTERED:none  DRAINS: none   LOCAL MEDICATIONS USED:  LIDOCAINE   SPECIMEN:  Source of Specimen:  left submandibular gland  DISPOSITION OF SPECIMEN:  PATHOLOGY  COUNTS:  YES  TOURNIQUET:  * No tourniquets in log *  DICTATION: .Other Dictation: Dictation Number [id number]  PLAN OF CARE: Discharge to home after PACU  PATIENT DISPOSITION:  PACU - hemodynamically stable.   Delay start of Pharmacological VTE agent (>24hrs) due to surgical blood loss or risk of bleeding: not applicable

## 2012-09-08 NOTE — Op Note (Signed)
NAME:  Evelyn Wade, Evelyn Wade        ACCOUNT NO.:  [id number]  MEDICAL RECORD NO.:  [id number]  LOCATION:  [facility]                         FACILITY:  [facility]  PHYSICIAN:  Newman Pies, MD            DATE OF BIRTH:  03/20/1970  DATE OF PROCEDURE:  09/07/2012 DATE OF DISCHARGE:  09/07/2012                              OPERATIVE REPORT   SURGEON:  Newman Pies, MD  PREOPERATIVE DIAGNOSIS:  Left submandibular gland sialoadenitis and sialolithiasis.  POSTOPERATIVE DIAGNOSIS:  Left submandibular gland sialoadenitis and sialolithiasis.  PROCEDURE PERFORMED:  Excision of left submandibular gland.  ANESTHESIA:  General endotracheal tube anesthesia.  COMPLICATIONS:  None.  ESTIMATED BLOOD LOSS:  Minimal.  ASSISTANT:  Roma Schanz, P.A.-C  INDICATION FOR PROCEDURE:  The patient is a 42 year old female with a history of chronic left submandibular gland sialoadenitis.  She was previously treated with multiple courses of antibiotics.  Despite the treatment, she continues to have significant induration of her left submandibular gland.  On her CT scan, she was noted to have 2 stones within the left submandibular gland, 1 measured 7 mm in diameter and the 2nd 1 measured 2 mm in diameter.  Based on the above findings, the decision was made for the patient to undergo excision of her left submandibular gland.  The risks, benefits, alternatives, and details of the procedure were discussed with the patient.  Questions were invited and answered.  Informed consent was obtained.  DESCRIPTION:  The patient was taken to the operating room and placed supine on the operating table.  General endotracheal tube anesthesia was administered by the anesthesiologist.  Preop IV antibiotics and Decadron were given.  The patient was positioned and prepped and draped in a standard fashion for left neck surgery.  An 1% lidocaine 100,000 epinephrine was injected at the planned site of incision.  A transverse incision was  made overlying the left submandibular gland.  The incision was carried down to the level of the platysma muscles.  Superiorly placed subplatysmal flap was elevated in a standard fashion.  At this point, the left submandibular gland was noted to be significantly inflamed and a significant amount of scarring was noted around the gland.  Careful dissection was carried out to free the gland from the surrounding tissues.  The facial vessels were identified and ligated. The gland was then retracted anteriorly, exposing the submandibular duct.  The submandibular duct was also ligated and divided.  The entire gland was then removed.  The surgical site was copiously irrigated. Good hemostasis was achieved with a combination of bipolar electrocautery and suture ligature.  The incision was closed in layers with 4-0 Vicryl and Dermabond.  The care of the patient was turned over to the anesthesiologist.  The patient was awakened from anesthesia without difficulty.  She was extubated and transferred to the recovery room in good condition.  FINDINGS:  An inflamed and enlarged left submandibular gland was noted. It was removed without difficulty.  The polyp or stone was noted within the gland.  SPECIMEN:  Left submandibular gland.  FOLLOWUP CARE:  The patient will be discharged home once she is awake and alert.  She will be placed on Percocet 1-2 tablets  p.o. q.4 hours p.r.n. pain and amoxicillin 875 mg p.o. b.i.d. for 5 days.  The patient will follow up in my office in 1 week.     Newman Pies, MD     ST/MEDQ  D:  09/07/2012  T:  09/08/2012  Job:  [id number]  cc:   Candyce Churn. Allyne Gee, M.D.

## 2012-09-09 ENCOUNTER — Encounter (HOSPITAL_COMMUNITY): Payer: Self-pay | Admitting: Otolaryngology

## 2013-02-15 ENCOUNTER — Other Ambulatory Visit: Payer: Self-pay

## 2013-02-15 DIAGNOSIS — Z1231 Encounter for screening mammogram for malignant neoplasm of breast: Secondary | ICD-10-CM

## 2013-03-07 ENCOUNTER — Ambulatory Visit
Admission: RE | Admit: 2013-03-07 | Discharge: 2013-03-07 | Disposition: A | Discharge status: Home / Self Care | Payer: BC Managed Care – PPO | Source: Ambulatory Visit

## 2013-03-07 DIAGNOSIS — Z1231 Encounter for screening mammogram for malignant neoplasm of breast: Secondary | ICD-10-CM

## 2014-01-03 LAB — OB RESULTS CONSOLE GC/CHLAMYDIA
CHLAMYDIA, DNA PROBE: NEGATIVE
Gonorrhea: NEGATIVE

## 2014-01-03 LAB — OB RESULTS CONSOLE HEPATITIS B SURFACE ANTIGEN: Hepatitis B Surface Ag: NEGATIVE

## 2014-01-03 LAB — OB RESULTS CONSOLE RPR: RPR: NONREACTIVE

## 2014-01-03 LAB — OB RESULTS CONSOLE ABO/RH: RH TYPE: POSITIVE

## 2014-01-03 LAB — OB RESULTS CONSOLE HIV ANTIBODY (ROUTINE TESTING): HIV: NONREACTIVE

## 2014-01-03 LAB — OB RESULTS CONSOLE ANTIBODY SCREEN: Antibody Screen: NEGATIVE

## 2014-01-03 LAB — OB RESULTS CONSOLE RUBELLA ANTIBODY, IGM: Rubella: NON-IMMUNE/NOT IMMUNE

## 2014-02-16 ENCOUNTER — Encounter: Payer: Self-pay | Admitting: *Deleted

## 2014-02-16 ENCOUNTER — Encounter: Payer: 59 | Attending: Family Medicine | Admitting: *Deleted

## 2014-02-16 VITALS — Ht 60.0 in | Wt 313.1 lb

## 2014-02-16 DIAGNOSIS — Z794 Long term (current) use of insulin: Secondary | ICD-10-CM | POA: Diagnosis not present

## 2014-02-16 DIAGNOSIS — Z713 Dietary counseling and surveillance: Secondary | ICD-10-CM | POA: Diagnosis not present

## 2014-02-16 DIAGNOSIS — O24019 Pre-existing diabetes mellitus, type 1, in pregnancy, unspecified trimester: Secondary | ICD-10-CM | POA: Diagnosis not present

## 2014-02-16 NOTE — Progress Notes (Signed)
  Medical Nutrition Therapy:  Appt start time: 0730 end time:  0830.  Assessment:  Primary concerns today: 02/16/2014. Patient is here for obesity complicating her pregnancy. She is 3.5 months gestation with first pregnancy. She works in [employer] full time. She lives with husband, they share the shopping, she does the cooking. She has history of pre-diabetes, she SMBG 4 times a day, FBG and 2 hours post meal. She is reporting range of 85-95 mg/dl. No activity at this time, she was exercising prior to the pregnancy, walking and strength training. History of Bariatric surgery December, 2014 at [facility]. She is still under care of nutritionist every 6 months post op. Pre surgery weight just over a year ago was 429 pounds, current weight today is 313.1 pounds. She states history of hypoglycemia when taking Glipizide, which she is no longer on.   Preferred Learning Style:   Visual  Hands on  Learning Readiness:   Ready  Change in progress  MEDICATIONS: see list   DIETARY INTAKE:  24-hr recall:  B ( AM): yogurt with 100 calorie nuts OR 100 calorie bread with PNB Snk ( AM): occasionally cheese and crackers   L ( PM): varies, brings from home- beans OR grilled chicken, occasionally with raw vegetables Snk ( PM): occasionally nuts or other protein, maybe with crackers or yogurt D ( PM): protein, some vegetables  Snk ( PM): not since she came off Glyburide, which caused hypoglycemia, unless 1 scoop ice cream Beverages: water  Usual physical activity: not since pregnancy  Estimated energy needs: 2000 calories 225 g carbohydrates 150 g protein 56 g fat  Progress Towards Goal(s):  In progress.   Nutritional Diagnosis:  NB-1.1 Food and nutrition-related knowledge deficit As related to pregnancy with history of obesity and pre-diabetes.  As evidenced by A1c stated by patient of 5.3%.    Intervention:  Nutrition counseling and diabetes education initiated. Discussed Carb Counting by food  group as method of portion control, reading food labels, and benefits of increased activity. .  Teaching Method Utilized:  Visual Auditory Hands on  Handouts given during visit include: Carb Counting and Food Label handouts Meal Plan Card GDM guidelines regarding SMBG and distribution of meals and snacks  Barriers to learning/adherence to lifestyle change: none  Demonstrated degree of understanding via:  Teach Back   Monitoring/Evaluation:  Dietary intake, exercise, reading food labels, and body weight prn.

## 2014-02-16 NOTE — Patient Instructions (Signed)
Plan:  Aim for 1 Carb Choice per meal (15 grams) +/- 0.5 either way  Aim for 1 Carb per snack  Include protein in moderation with your meals and snacks Continue reading food labels for Total Carbohydrate and Fat Grams of foods Consider  increasing your activity level by Arm Chair Exercises or walking daily as tolerated (water aerobics would be great too!) Continue checking BG as directed by MD  Continue taking medication as directed by MD

## 2014-06-07 IMAGING — CT CT NECK W/ CM
4 of 6 series · 16 of 33 positions shown, 19 images · IV contrast ([ID] OMNI 300)
Comparison: None.

CLINICAL DATA: 1 month left neck swelling.  Decrease in salivary
gland size with antibiotics.  Question stone.

CT NECK WITH CONTRAST
TECHNIQUE: Multidetector CT imaging of the neck was performed with
intravenous contrast.
Contrast: 100mL OMNIPAQUE IOHEXOL 300 MG/ML  SOLN

[Series 3: axial neck · axial · 0.53mm/px · z∈[-220,-80]mm · 3 of 114 slices shown]
[im 29/114  bone]
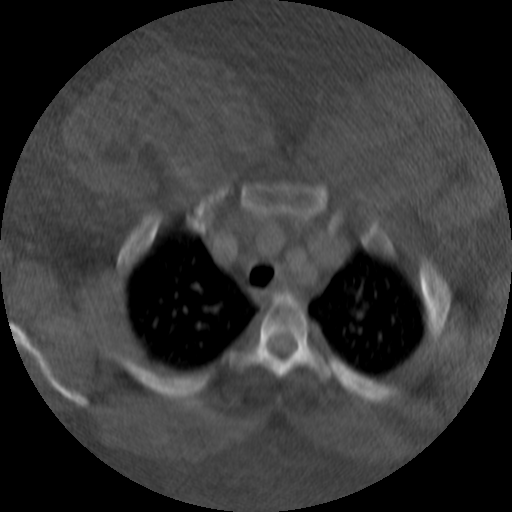
[im 57/114  bone]
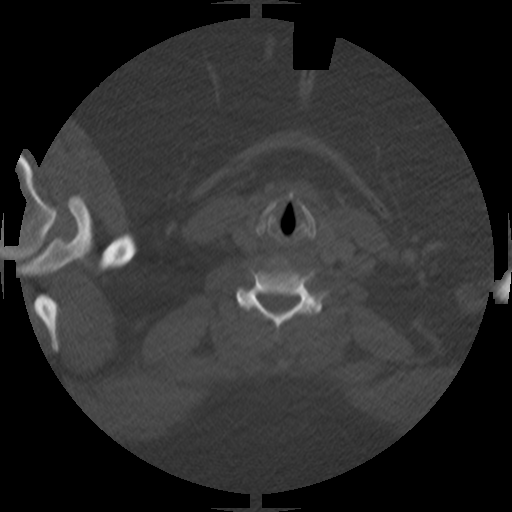
[im 85/114  bone]
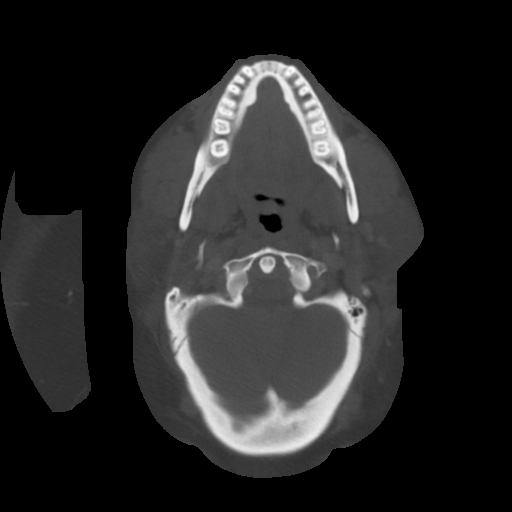

[Series 200: cor · coronal · 0.57mm/px · 3 of 128 slices shown]
[im 40/128  bone]
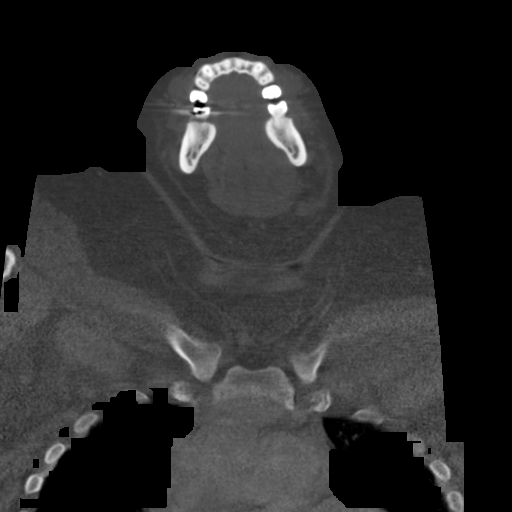
[im 56/128  bone]
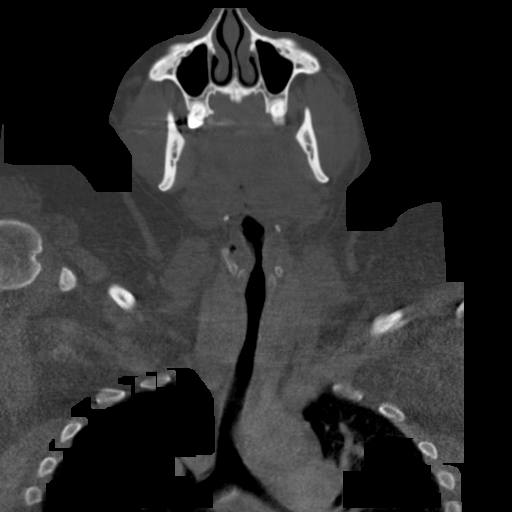
[im 72/128  bone]
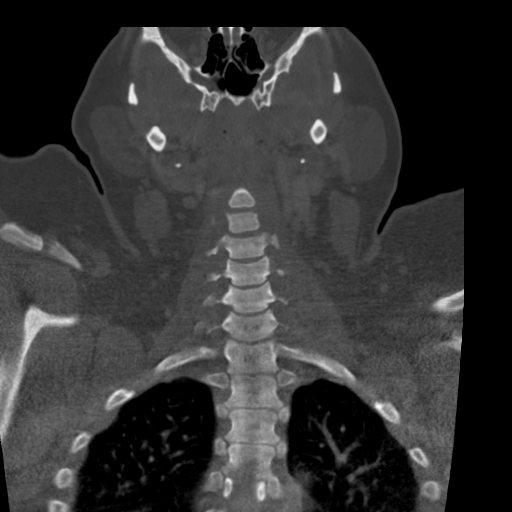

[Series 201: sag · sagittal · 0.57mm/px · 5 of 128 slices shown, 6 images]
[im 43/128  bone]
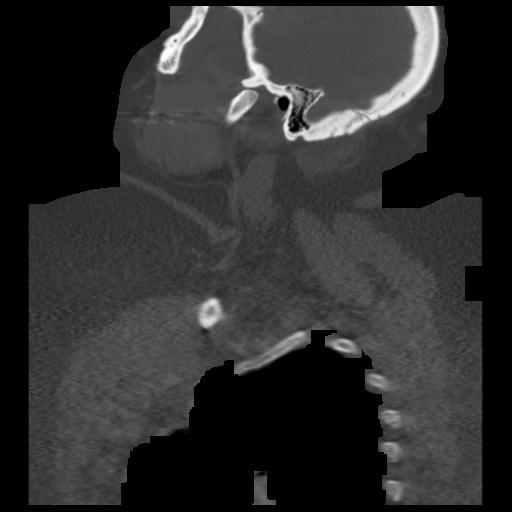
[im 53/128  bone]
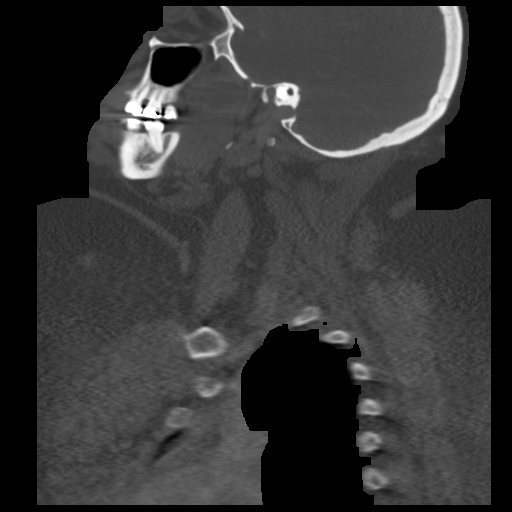
[im 64/128  soft-tissue]
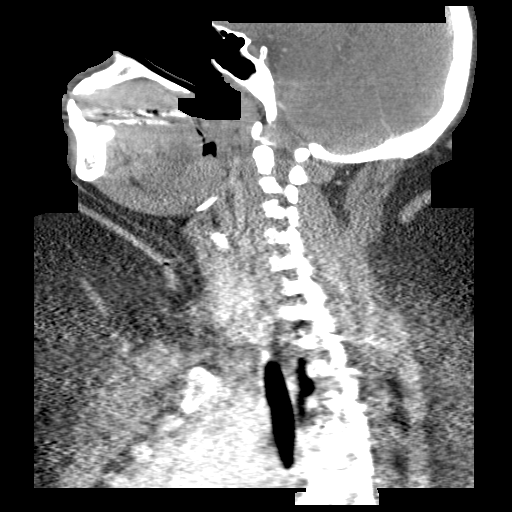
[im 64/128  bone]
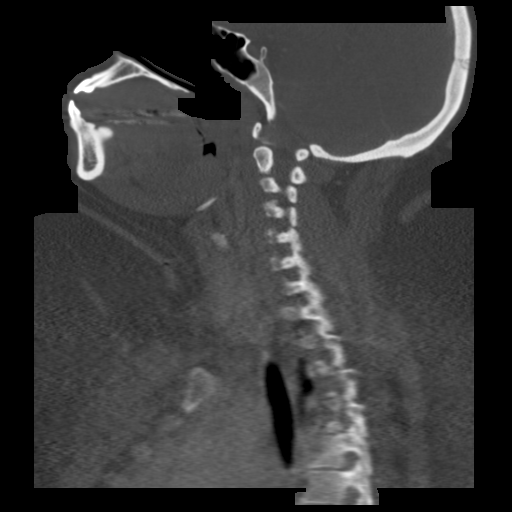
[im 75/128  bone]
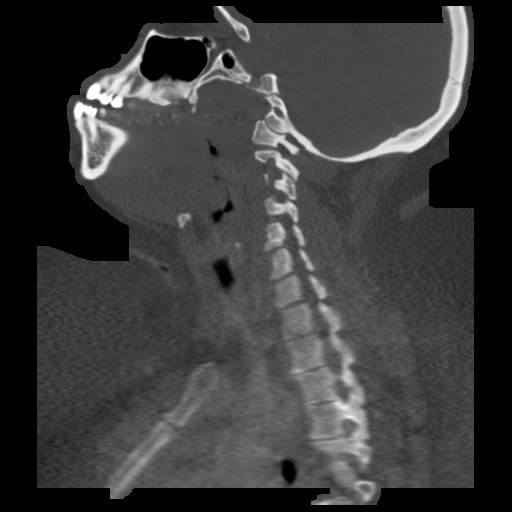
[im 85/128  bone]
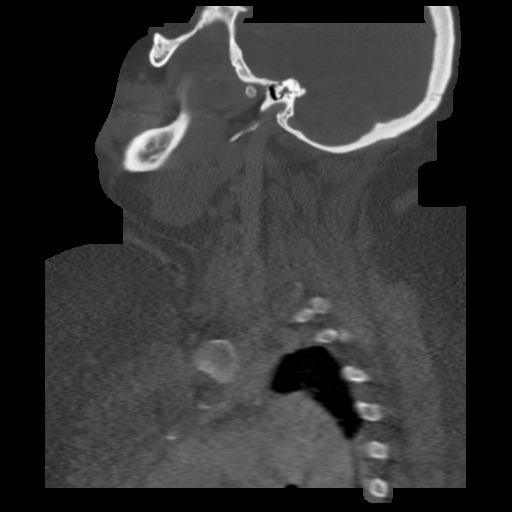

[Series 202: axial · axial · 0.45mm/px · z∈[-269,-69]mm · 5 of 154 slices shown, 7 images]
[im 26/154  soft-tissue]
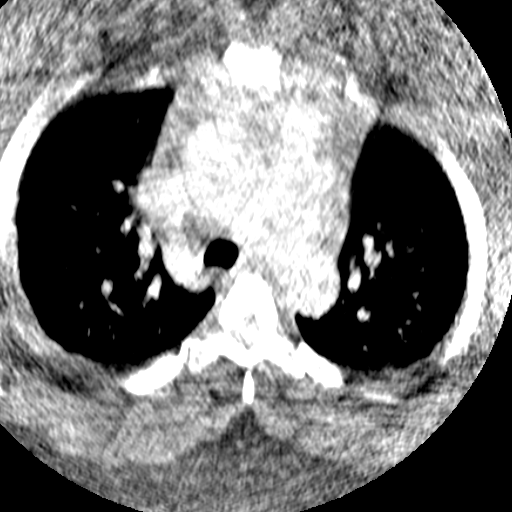
[im 26/154  bone]
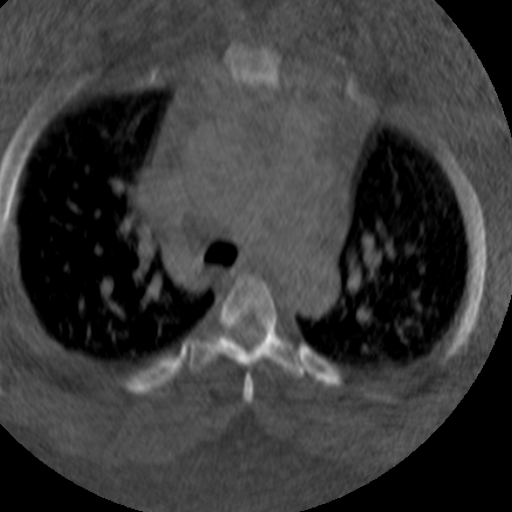
[im 52/154  bone]
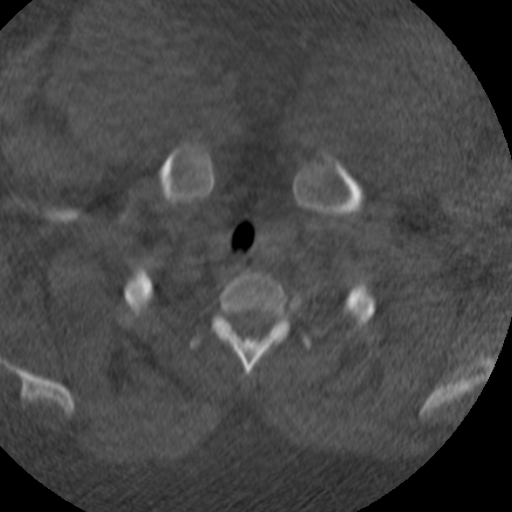
[im 77/154  bone]
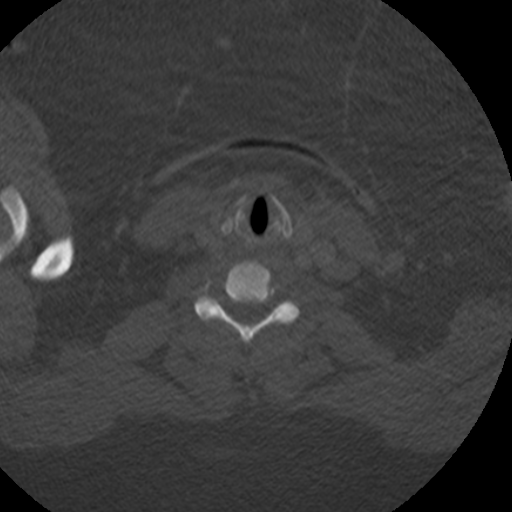
[im 103/154  bone]
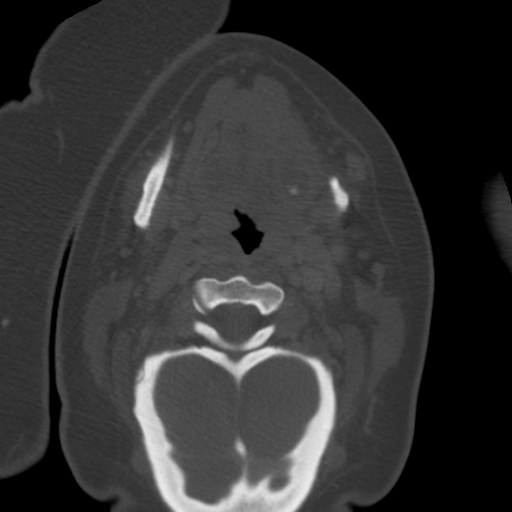
[im 128/154  soft-tissue]
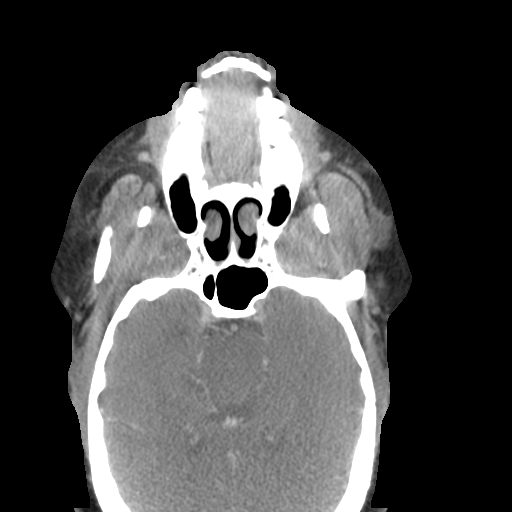
[im 128/154  bone]
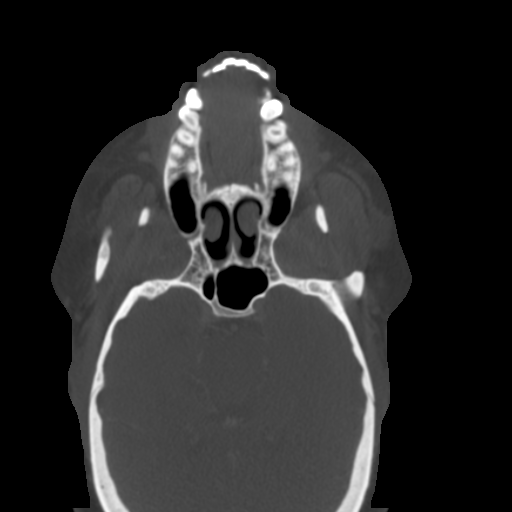

[16 of 33 positions shown; findings below may reference images not displayed]

FINDINGS: There is a large stone in the left submandibular space,
measuring 7 x 5 mm.  There is surrounding low attenuation which
extends anteriorly into the sublingual space.  There is an
additional smaller stone within the left submandibular gland
parenchyma, 2 mm in diameter.  The left submandibular gland is
asymmetrically enlarged.  No periglandular inflammatory changes.
No stones within the remaining salivary glands.  No abnormal
enhancement in the floor of the mouth, at the orifice of
submandibular duct.  Mild enlargement of left cervical lymph nodes,
presumably reactive.

There is a distortion of the hypopharyngeal airway, likely a
combination of compressive soft tissues and lingual tonsillar
enlargement.  No suspicious enhancement along the surfaces of the
aerodigestive tract.  Mild enlargement of thyroid gland diffusely
with mild constriction of the trachea.  No invasive margins or
surrounding adenopathy.  Apical lungs are clear.  There may be
cardiomegaly.  No acute osseous abnormality.  Limited intracranial
imaging unremarkable.
IMPRESSION: 1. Left submandibular sialolithiasis with sialocele.  The larger of
2 stones is in the posterior submandibular duct, 7 x 5 mm.

2. Mild thyromegaly.

## 2014-06-19 IMAGING — CR DG CHEST 2V
2 series · 2 of 2 positions shown · non-contrast
Comparison: November 23, 2008

CLINICAL DATA: Hypertension, diabetic.  Preoperative for excision
of submandibular gland.

CHEST - 2 VIEW

[w chest pa]
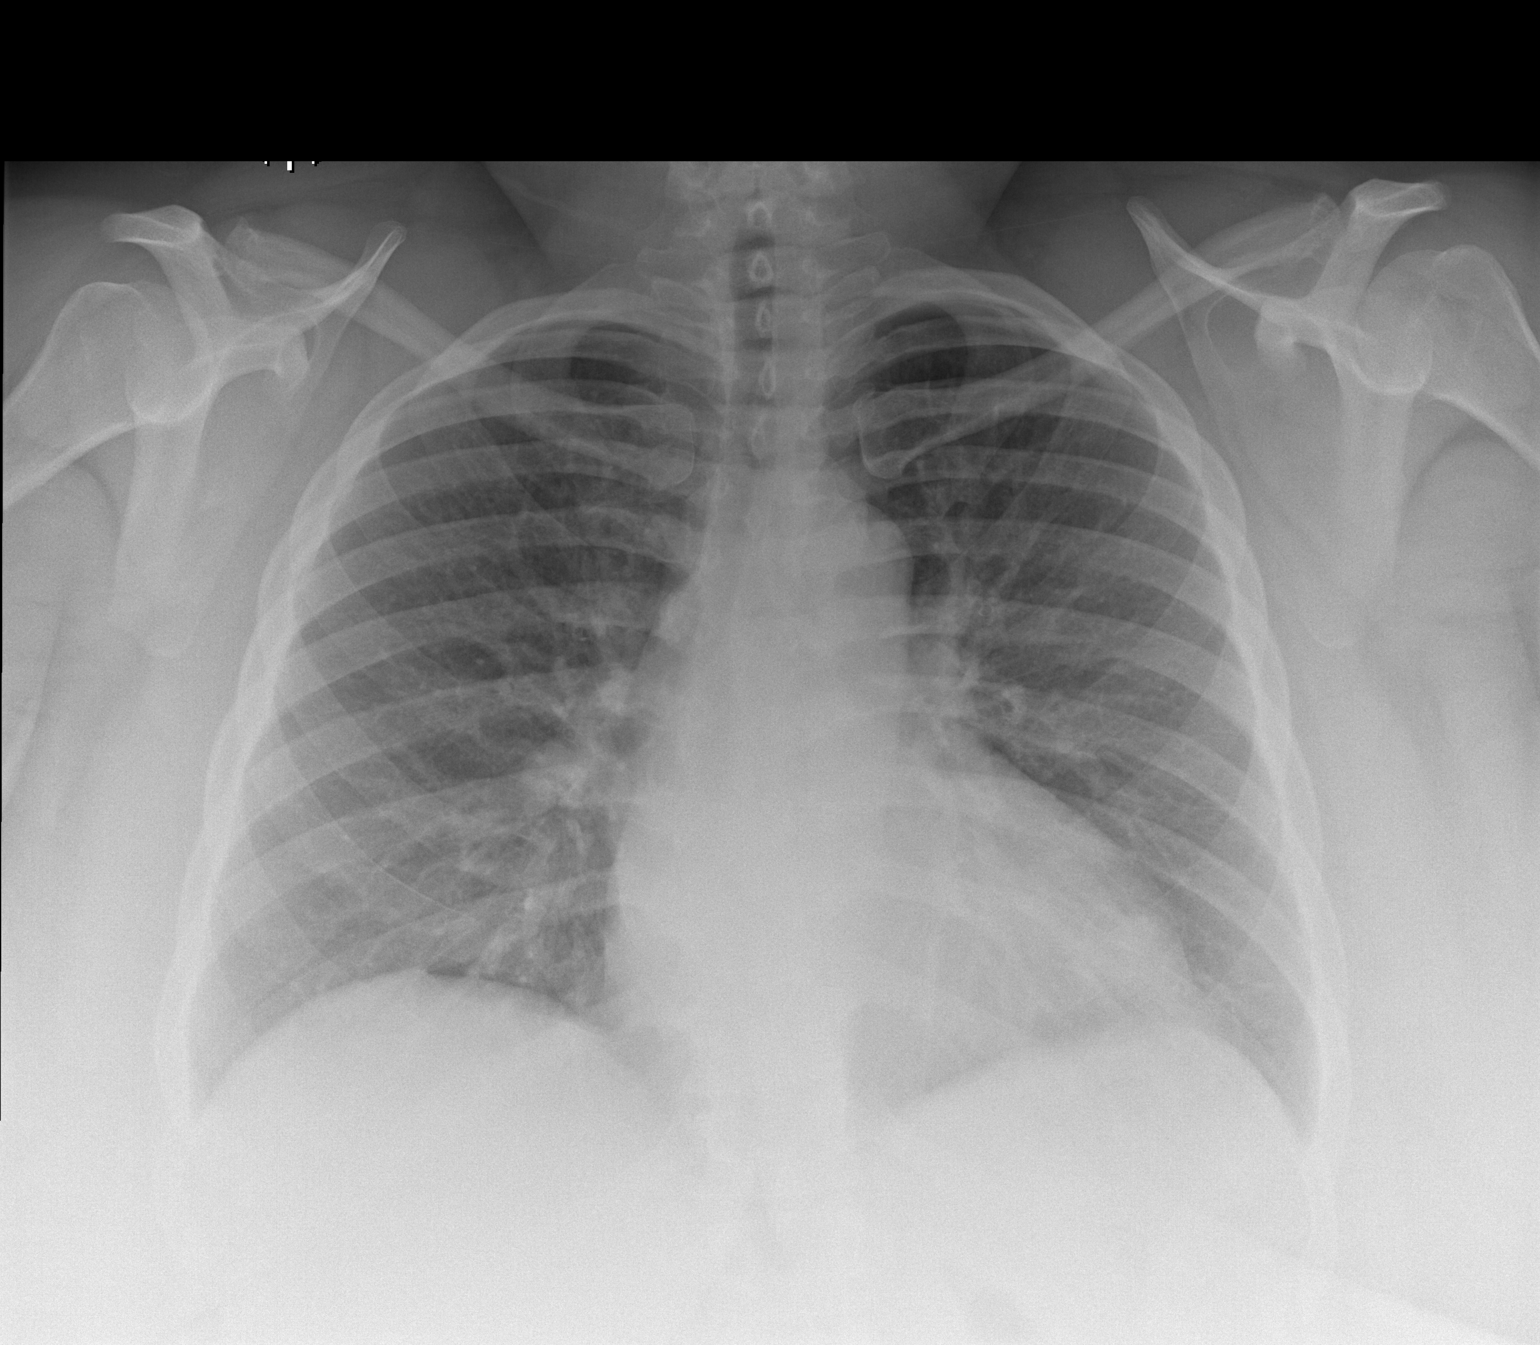

[w chest lat]
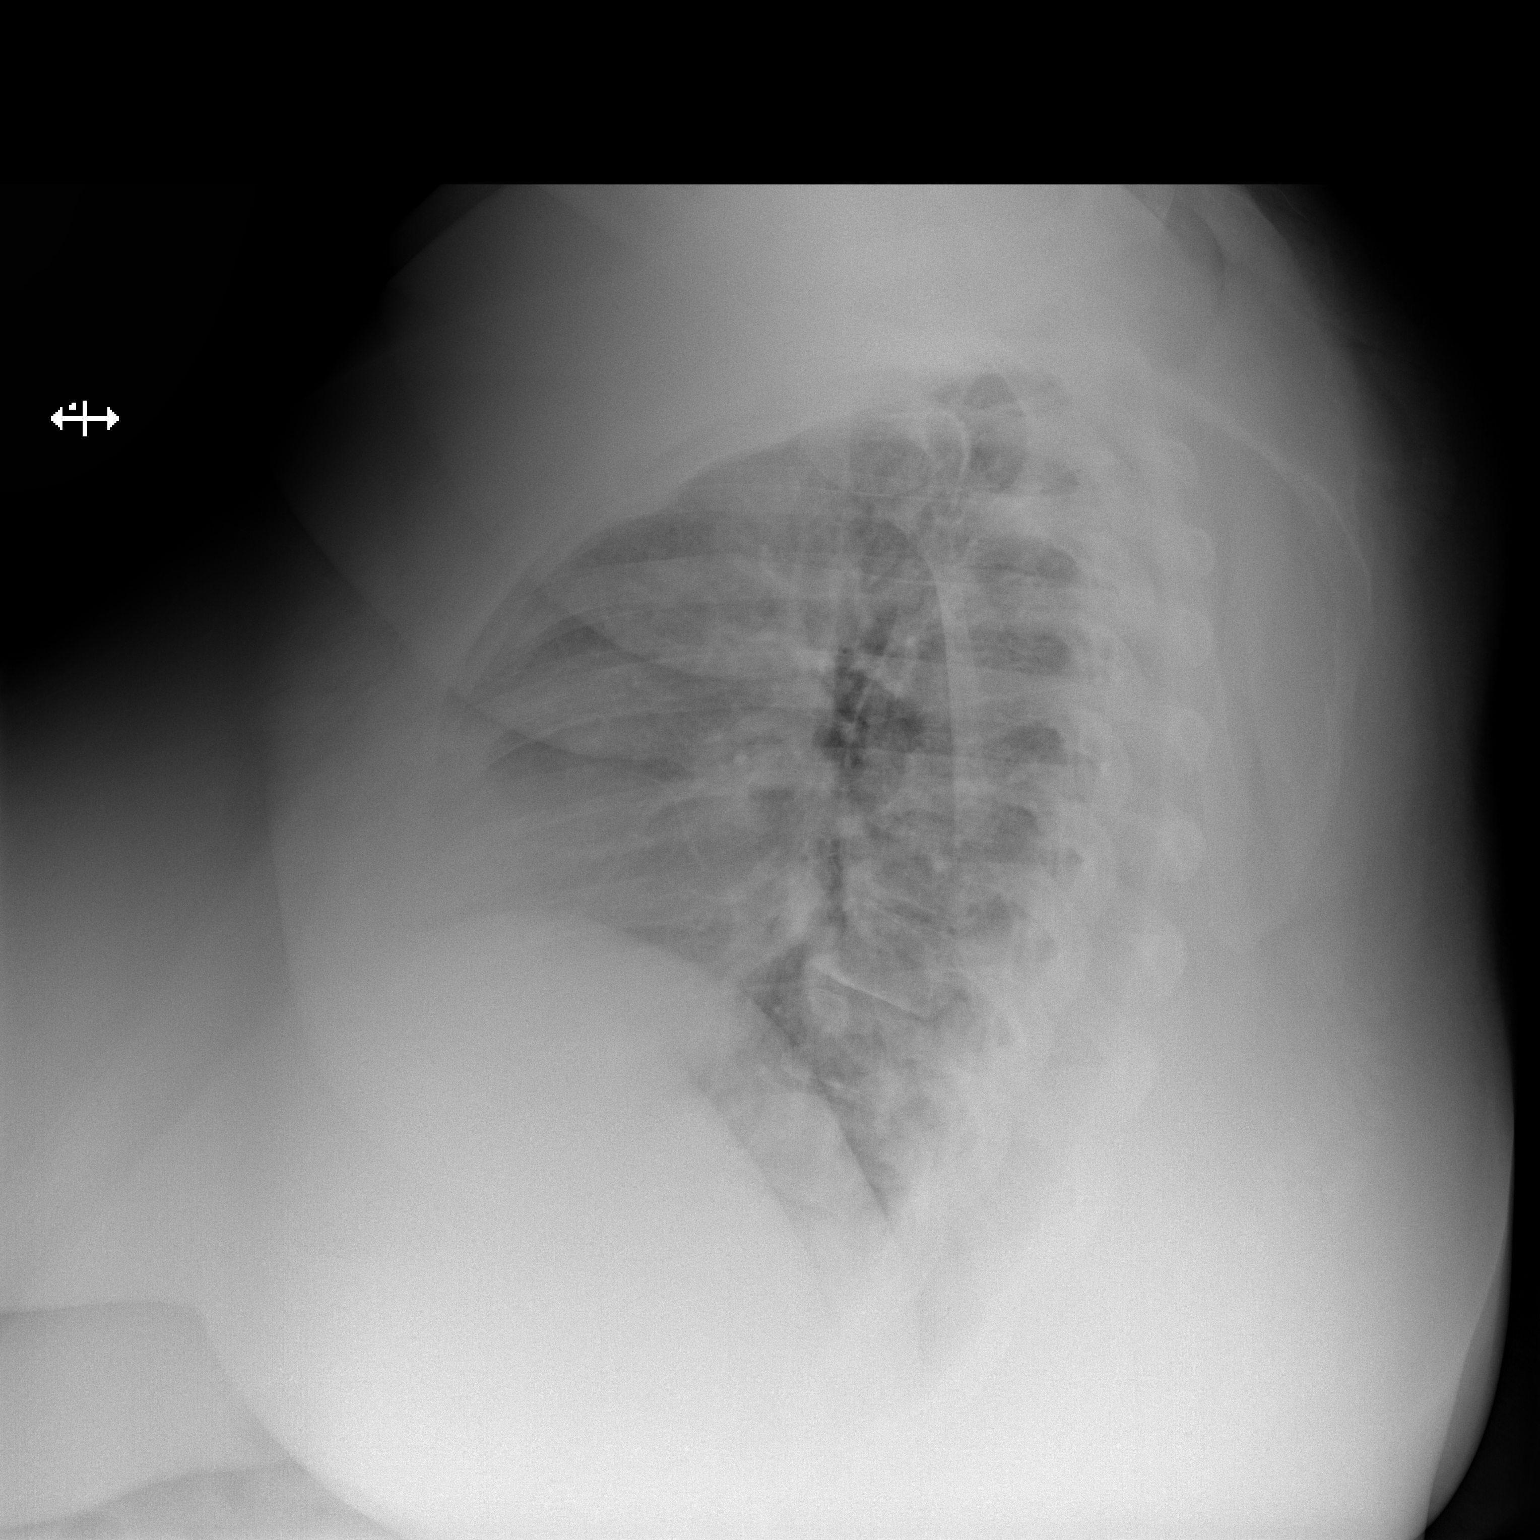

[2 of 2 positions shown; findings below may reference images not displayed]

FINDINGS: There is no focal infiltrate, pulmonary edema, or pleural
effusion.  The mediastinal contour and cardiac silhouette are
normal.  The soft tissues and osseous structures are stable.
IMPRESSION: No acute cardiopulmonary disease identified.

## 2014-07-28 ENCOUNTER — Inpatient Hospital Stay (HOSPITAL_COMMUNITY)
Admission: AD | Admit: 2014-07-28 | Payer: BC Managed Care – PPO | Source: Ambulatory Visit | Admitting: Obstetrics and Gynecology

## 2014-08-09 ENCOUNTER — Encounter (HOSPITAL_COMMUNITY): Payer: Self-pay | Admitting: *Deleted

## 2014-08-09 ENCOUNTER — Telehealth (HOSPITAL_COMMUNITY): Payer: Self-pay | Admitting: *Deleted

## 2014-08-09 LAB — OB RESULTS CONSOLE GBS: STREP GROUP B AG: NEGATIVE

## 2014-08-09 NOTE — Telephone Encounter (Signed)
Preadmission screen  

## 2014-08-14 NOTE — H&P (Signed)
Evelyn Wade is a 44 y.o. female, G1 P0 at 40.0 weeks  Patient Active Problem List   Diagnosis Date Noted  . Diabetes 08/14/2012  . Morbid obesity 08/14/2012    Pregnancy Course: Patient entered care at 15.3 weeks.   EDC of 08/14/14 was established by LMP.      [place] evaluations:   16.1 weeks - Dating: Transverse head maternal right. Posterior placenta. Normal fluid. AP pocket = 4.6 cm. Cx closed, length 4.02,   23.6 weeks - Anatomy: EFW 1lb 9oz - 77.4%, posterior placenta, breech, unable to visualize d/t habitus    35.1 weeks - FU: AFI 16.66, FHR 158, vertex, posterior placenta, BPP 8/8,  38.1 weeks - FU: EFW 8lbs 4oz - 86.1%,  AFI 23.69,  FHR 169,  BPP 8/8, vertex  39.0 weeks - FU: AFI 16.81,  BPP 8/8, vertex  Significant prenatal events:   GDM no meds, PCOS, 5'0', BMI 70, anemia 10.9, HTN no meds, AMA,   Last evaluation:   39.0 weeks   VE:0/0/-4 on 08/07/14  Reason for admission:  IOL for DM  Pt States:   Contractions Frequency: none         Contraction severity: n/a         Fetal activity: +FM  OB History    Gravida Para Term Preterm AB TAB SAB Ectopic Multiple Living   1 0 0 0 0 0 0 0 0 0      Past Medical History  Diagnosis Date  . Diabetes mellitus without complication   . Hypertension   . Pneumonia     hx  . Sleep apnea     cpap 4-5 yrs   Past Surgical History  Procedure Laterality Date  . Knee arthroscopy Left   . Tonsillectomy    . Submandibular gland excision Left 09/07/2012    Procedure: EXCISION SUBMANDIBULAR GLAND;  Surgeon: Ascencion Dike, MD;  Location: [facility];  Service: ENT;  Laterality: Left;  Excision Submandibular Gland  . Bariatric surgery     Family History: family history includes Hypertension in her brother. Social History:  reports that she has never smoked. She has never used smokeless tobacco. She reports that she does not drink alcohol or use illicit drugs.   Prenatal Transfer Tool  Maternal Diabetes: GDM no meds Genetic  Screening: Normal Maternal Ultrasounds/Referrals: Normal Fetal Ultrasounds or other Referrals:  None Maternal Substance Abuse:  No Significant Maternal Medications:  None Significant Maternal Lab Results: None   ROS:  See HPI above, all other systems are negative  No Known Allergies    Last menstrual period 09/23/2013.  Maternal Exam:  Uterine Assessment: Contraction frequency is rare.  Abdomen: Gravid, non tender. Fundal height is aga.  Normal external genitalia, vulva, cervix, uterus and adnexa.  No lesions noted on exam.  Pelvis adequate for delivery.  Fetal presentation: Vertex by Leopold's   Fetal Exam:  Monitor Surveillance : Continuous Monitoring Mode: Ultrasound.  NICHD: Category CTXs: none EFW   9 lbs  Physical Exam: Nursing note and vitals reviewed General: alert and cooperative She appears well nourished Psychiatric: Normal mood and affect. Her behavior is normal Head: Normocephalic Eyes: Pupils are equal, round, and reactive to light Neck: Normal range of motion Cardiovascular: RRR without murmur  Respiratory: CTAB. Effort normal  Abd: soft, non-tender, +BS, no rebound, no guarding  Genitourinary: Vagina normal  Neurological: A&Ox3 Skin: Warm and dry  Musculoskeletal: Normal range of motion  Homan's sign negative bilaterally No evidence of DVTs.  Edema: Minimal bilaterally non-pitting edema DTR: 2+ Clonus: None   Prenatal labs: ABO, Rh: O/Positive/-- (11/25 0000) Antibody: Negative (11/25 0000) Rubella:   non immune RPR: Nonreactive (11/25 0000)  HBsAg: Negative (11/25 0000)  HIV: Non-reactive (11/25 0000)  GBS: Negative (06/30 0000) Sickle cell/Hgb electrophoresis:  WNL Pap:  wnl 04/13/13 GC:   negative Chlamydia: negative Genetic screenings:  wnl Glucola:  GDM  Assessment:  IUP at 40.0 weeks NICHD: Category Membranes: intact Bishop Score: 1 GBS negative 07/20/14   Plan:  Admit to L&D for IOL d/t IOL options reviewed with patient  including cytotec, foley bulb, AROM, and pitocin R&B of IOL reviewed including serial induction, failure, and/or CS requirement Pt and family verbalize understanding and agree with treatment plan. Start induction with cytotec Okay to ambulate around unit with wireless monitors  Okay to get up and shower without monitoring  Regular diet prior to starting pitocin Clear/Thin diet after pitocin starts  Will place next dose cytotec or foley bulb at 0530  Continue with labor mgmt as ordered  IV pain medication per orders PRN Epidural per patient request Foley cath after patient is comfortable with epidural  Anticipate SVD  Attending MD available at all times.   Shavelle Runkel, CNM, MSN 08/14/2014, 10:28 PM

## 2014-08-15 ENCOUNTER — Encounter (HOSPITAL_COMMUNITY): Payer: Self-pay | Admitting: Anesthesiology

## 2014-08-15 ENCOUNTER — Inpatient Hospital Stay (HOSPITAL_COMMUNITY)
Admission: RE | Admit: 2014-08-15 | Discharge: 2014-08-20 | DRG: 765 | Disposition: A | Discharge status: Home / Self Care | Payer: 59 | Source: Ambulatory Visit | Attending: Obstetrics and Gynecology | Admitting: Obstetrics and Gynecology

## 2014-08-15 ENCOUNTER — Encounter (HOSPITAL_COMMUNITY): Payer: Self-pay

## 2014-08-15 DIAGNOSIS — O24420 Gestational diabetes mellitus in childbirth, diet controlled: Secondary | ICD-10-CM | POA: Diagnosis present

## 2014-08-15 DIAGNOSIS — O9081 Anemia of the puerperium: Secondary | ICD-10-CM | POA: Diagnosis not present

## 2014-08-15 DIAGNOSIS — Z3A40 40 weeks gestation of pregnancy: Secondary | ICD-10-CM | POA: Diagnosis present

## 2014-08-15 DIAGNOSIS — R6252 Short stature (child): Secondary | ICD-10-CM

## 2014-08-15 DIAGNOSIS — Z8249 Family history of ischemic heart disease and other diseases of the circulatory system: Secondary | ICD-10-CM | POA: Diagnosis not present

## 2014-08-15 DIAGNOSIS — O9962 Diseases of the digestive system complicating childbirth: Secondary | ICD-10-CM | POA: Diagnosis present

## 2014-08-15 DIAGNOSIS — O99214 Obesity complicating childbirth: Secondary | ICD-10-CM | POA: Diagnosis present

## 2014-08-15 DIAGNOSIS — E119 Type 2 diabetes mellitus without complications: Secondary | ICD-10-CM

## 2014-08-15 DIAGNOSIS — Z6841 Body Mass Index (BMI) 40.0 and over, adult: Secondary | ICD-10-CM | POA: Diagnosis not present

## 2014-08-15 DIAGNOSIS — O1092 Unspecified pre-existing hypertension complicating childbirth: Secondary | ICD-10-CM | POA: Diagnosis present

## 2014-08-15 DIAGNOSIS — K219 Gastro-esophageal reflux disease without esophagitis: Secondary | ICD-10-CM | POA: Diagnosis present

## 2014-08-15 DIAGNOSIS — I10 Essential (primary) hypertension: Secondary | ICD-10-CM | POA: Diagnosis present

## 2014-08-15 DIAGNOSIS — O9989 Other specified diseases and conditions complicating pregnancy, childbirth and the puerperium: Secondary | ICD-10-CM

## 2014-08-15 DIAGNOSIS — G4730 Sleep apnea, unspecified: Secondary | ICD-10-CM | POA: Diagnosis present

## 2014-08-15 DIAGNOSIS — O09513 Supervision of elderly primigravida, third trimester: Secondary | ICD-10-CM

## 2014-08-15 DIAGNOSIS — D649 Anemia, unspecified: Secondary | ICD-10-CM | POA: Diagnosis not present

## 2014-08-15 DIAGNOSIS — Z98891 History of uterine scar from previous surgery: Secondary | ICD-10-CM

## 2014-08-15 DIAGNOSIS — O09899 Supervision of other high risk pregnancies, unspecified trimester: Secondary | ICD-10-CM

## 2014-08-15 DIAGNOSIS — Z2839 Other underimmunization status: Secondary | ICD-10-CM

## 2014-08-15 DIAGNOSIS — O09529 Supervision of elderly multigravida, unspecified trimester: Secondary | ICD-10-CM

## 2014-08-15 DIAGNOSIS — Z283 Underimmunization status: Secondary | ICD-10-CM

## 2014-08-15 LAB — CBC
HCT: 30.6 % — ABNORMAL LOW (ref 36.0–46.0)
HEMOGLOBIN: 10 g/dL — AB (ref 12.0–15.0)
MCH: 25.5 pg — ABNORMAL LOW (ref 26.0–34.0)
MCHC: 32.7 g/dL (ref 30.0–36.0)
MCV: 78.1 fL (ref 78.0–100.0)
Platelets: 219 10*3/uL (ref 150–400)
RBC: 3.92 MIL/uL (ref 3.87–5.11)
RDW: 16.6 % — AB (ref 11.5–15.5)
WBC: 10.1 10*3/uL (ref 4.0–10.5)

## 2014-08-15 LAB — ABO/RH: ABO/RH(D): O POS

## 2014-08-15 LAB — GLUCOSE, CAPILLARY
GLUCOSE-CAPILLARY: 91 mg/dL (ref 65–99)
GLUCOSE-CAPILLARY: 79 mg/dL (ref 65–99)
Glucose-Capillary: 88 mg/dL (ref 65–99)
Glucose-Capillary: 93 mg/dL (ref 65–99)
Glucose-Capillary: 75 mg/dL (ref 65–99)

## 2014-08-15 LAB — TYPE AND SCREEN
ABO/RH(D): O POS
ANTIBODY SCREEN: NEGATIVE

## 2014-08-15 LAB — RPR: RPR: NONREACTIVE

## 2014-08-15 LAB — HIV ANTIBODY (ROUTINE TESTING W REFLEX): HIV Screen 4th Generation wRfx: NONREACTIVE

## 2014-08-15 MED ORDER — LACTATED RINGERS IV SOLN: 500.0000 mL | INTRAVENOUS | Status: DC | PRN

## 2014-08-15 MED ORDER — OXYTOCIN 40 UNITS IN LACTATED RINGERS INFUSION - SIMPLE MED
1.0000 m[IU]/min | INTRAVENOUS | Status: DC
  Administered 2014-08-15: 2 m[IU]/min via INTRAVENOUS
  Filled 2014-08-15: qty 1000

## 2014-08-15 MED ORDER — ACETAMINOPHEN 325 MG PO TABS: 650.0000 mg | ORAL_TABLET | ORAL | Status: DC | PRN

## 2014-08-15 MED ORDER — CITRIC ACID-SODIUM CITRATE 334-500 MG/5ML PO SOLN
30.0000 mL | ORAL | Status: DC | PRN
  Administered 2014-08-17: 30 mL via ORAL
  Filled 2014-08-15: qty 15

## 2014-08-15 MED ORDER — NALBUPHINE HCL 10 MG/ML IJ SOLN: 5.0000 mg | INTRAMUSCULAR | Status: DC | PRN

## 2014-08-15 MED ORDER — OXYTOCIN 40 UNITS IN LACTATED RINGERS INFUSION - SIMPLE MED: 62.5000 mL/h | INTRAVENOUS | Status: DC

## 2014-08-15 MED ORDER — ONDANSETRON HCL 4 MG/2ML IJ SOLN: 4.0000 mg | Freq: Four times a day (QID) | INTRAMUSCULAR | Status: DC | PRN

## 2014-08-15 MED ORDER — LIDOCAINE HCL (PF) 1 % IJ SOLN: 30.0000 mL | INTRAMUSCULAR | Status: DC | PRN

## 2014-08-15 MED ORDER — OXYCODONE-ACETAMINOPHEN 5-325 MG PO TABS: 2.0000 | ORAL_TABLET | ORAL | Status: DC | PRN

## 2014-08-15 MED ORDER — LACTATED RINGERS IV SOLN
INTRAVENOUS | Status: DC
  Administered 2014-08-15 – 2014-08-17 (×7): via INTRAVENOUS

## 2014-08-15 MED ORDER — TERBUTALINE SULFATE 1 MG/ML IJ SOLN: 0.2500 mg | Freq: Once | INTRAMUSCULAR | Status: AC | PRN

## 2014-08-15 MED ORDER — MISOPROSTOL 25 MCG QUARTER TABLET
25.0000 ug | ORAL_TABLET | ORAL | Status: DC | PRN
  Administered 2014-08-15 (×4): 25 ug via VAGINAL
  Filled 2014-08-15 (×5): qty 0.25

## 2014-08-15 MED ORDER — OXYCODONE-ACETAMINOPHEN 5-325 MG PO TABS: 1.0000 | ORAL_TABLET | ORAL | Status: DC | PRN

## 2014-08-15 MED ORDER — OXYTOCIN BOLUS FROM INFUSION: 500.0000 mL | INTRAVENOUS | Status: DC

## 2014-08-15 NOTE — Progress Notes (Signed)
  Subjective: Not aware of UCs any more.  Husband and mother at bedside.  Objective: BP 118/71 mmHg  Pulse 78  Temp(Src) 98.1 F (36.7 C)  Resp 18  Ht 5' (1.524 m)  Wt 161.934 kg (357 lb)  BMI 69.72 kg/m2  LMP 09/23/2013      FHT: Category 1 UC:   Occasional SVE:   Dilation: Closed Effacement (%): Thick Station: -3 Exam by:: Kenyetta Wimbish, CNM  #1 Cytotech 25 mcg placed in posterior fornix at 0935--cervix very firm.  CBG (last 3)   Recent Labs  08/15/14 0516 08/15/14 0747  GLUCAP 91 88     Assessment:   Induction for Type 2 DM Morbid obesity Unfavorable cervix GBS negative  Plan: Continue process of cervical ripening. Re-evaluate in 4 hours or prn.  Donnel Saxon CNM 08/15/2014, 10:02 AM

## 2014-08-15 NOTE — Progress Notes (Signed)
Subjective: In to meet and greet. Up in chair, reports some cramping, but "feels good to sit up." Spouse at bedside.  Objective: BP 129/68 mmHg  Pulse 73  Temp(Src) 98.4 F (36.9 C) (Oral)  Resp 18  Ht 5' (1.524 m)  Wt 161.934 kg (357 lb)  BMI 69.72 kg/m2  LMP 09/23/2013     Today's Vitals   08/15/14 0700 08/15/14 0730 08/15/14 1151 08/15/14 1741  BP: 127/61 118/71 125/64 129/68  Pulse: 82 78 81 73  Temp:    98.4 F (36.9 C)  TempSrc:    Oral  Resp:      Height:      Weight:       Results for orders placed or performed during the hospital encounter of 08/15/14 (from the past 24 hour(s))  CBC     Status: Abnormal   Collection Time: 08/15/14  2:20 AM  Result Value Ref Range   WBC 10.1 4.0 - 10.5 K/uL   RBC 3.92 3.87 - 5.11 MIL/uL   Hemoglobin 10.0 (L) 12.0 - 15.0 g/dL   HCT 30.6 (L) 36.0 - 46.0 %   MCV 78.1 78.0 - 100.0 fL   MCH 25.5 (L) 26.0 - 34.0 pg   MCHC 32.7 30.0 - 36.0 g/dL   RDW 16.6 (H) 11.5 - 15.5 %   Platelets 219 150 - 400 K/uL  Type and screen     Status: None   Collection Time: 08/15/14  2:20 AM  Result Value Ref Range   ABO/RH(D) O POS    Antibody Screen NEG    Sample Expiration 08/18/2014   RPR     Status: None   Collection Time: 08/15/14  2:20 AM  Result Value Ref Range   RPR Ser Ql Non Reactive Non Reactive  HIV antibody     Status: None   Collection Time: 08/15/14  2:20 AM  Result Value Ref Range   HIV Screen 4th Generation wRfx Non Reactive Non Reactive  ABO/Rh     Status: None   Collection Time: 08/15/14  2:20 AM  Result Value Ref Range   ABO/RH(D) O POS   Glucose, capillary     Status: None   Collection Time: 08/15/14  5:16 AM  Result Value Ref Range   Glucose-Capillary 91 65 - 99 mg/dL  Glucose, capillary     Status: None   Collection Time: 08/15/14  7:47 AM  Result Value Ref Range   Glucose-Capillary 88 65 - 99 mg/dL  Glucose, capillary     Status: None   Collection Time: 08/15/14 11:51 AM  Result Value Ref Range   Glucose-Capillary 93 65 - 99 mg/dL  Glucose, capillary     Status: None   Collection Time: 08/15/14  3:57 PM  Result Value Ref Range   Glucose-Capillary 75 65 - 99 mg/dL  Glucose, capillary     Status: None   Collection Time: 08/15/14  8:09 PM  Result Value Ref Range   Glucose-Capillary 79 65 - 99 mg/dL    FHT: Category 1 -- intermittent monitoring UC:   irregular, every 2-5 minutes EFW: Unable to determine due to habitus -- fetus measured 8+4 at 38 wks SVE: Deferred  Assessment:  44 yo G1P0 @ 40.1 wks IOL due to Type 2 DM, controlled w/ diet Normal CBGs Chronic Hypertension - normal BPs Morbid obesity AMA Rubella non-immune Suspect LGA infant  Plan: Continue current plan. Re-evaluate at 9:30 PM for either Cytotec or intracervical balloon. Pt aware and in agreement. Dr.  Varnado updated.  Farrel Gordon CNM 08/15/2014, 8:48 PM

## 2014-08-15 NOTE — Progress Notes (Signed)
  Subjective: Resting in bed--aware of mild cramping, but not uncomfortable.  Objective: BP 125/64 mmHg  Pulse 81  Temp(Src) 98.1 F (36.7 C)  Resp 18  Ht 5' (1.524 m)  Wt 161.934 kg (357 lb)  BMI 69.72 kg/m2  LMP 09/23/2013      FHT: Category 1 UC:   irregular, every 3-6 minutes SVE:   Dilation: Closed Effacement (%): 40 Station: -3 Exam by:: Evelyn Wade, CNM  #2 Cytotech placed in posterior fornix Pelvis feels adequate.   Assessment:  Induction for Type 2 DM Morbid obesity Unfavorable cervix  Plan: Continue to observe. Repeat Cytotech q 4 hours prn until cervix amenable to foley bulb or pitocin.. Will allow patient to ambulate after reactive tracing.  Evelyn Wade CNM 08/15/2014, 1:45 PM

## 2014-08-15 NOTE — Progress Notes (Signed)
Labor Progress  Subjective: IV placement very difficult.    Objective: BP 128/80 mmHg  Pulse 79  Temp(Src) 98.1 F (36.7 C)  Resp 20  Ht 5' (1.524 m)  Wt 357 lb (161.934 kg)  BMI 69.72 kg/m2  LMP 09/23/2013     FHT: 140, moderate variability, + accel no decel.  Occasional minimum variability as with a sleep cycle CTX:  regular, every 3 minutes Uterus gravid, soft non tender SVE:  Dilation: Closed Effacement (%): Thick Station: -3 Exam by:: V Anairis Knick CNM    Assessment:  IUP at 40.0 weeks NICHD: Category 1, occasional Category 2 d/t minimum variability Membranes: intact Labor progress: IOL VUY:[id number] 1GDM: stable sugars  Ctx too much for cytotec  IV placement difficult, placed in left foot CNRA   Plan: Continue labor plan Continuous monitoring Start pitocin per protocol CBG q2     Evelyn Wade, CNM, MSN 08/15/2014. 4:42 AM

## 2014-08-15 NOTE — Progress Notes (Signed)
  Subjective: Aware of contractions as tightening.  Questions need to maintain saline lock in foot.  Has IV site in left inner forearm.  Objective: BP 123/57 mmHg  Pulse 71  Temp(Src) 98.1 F (36.7 C)  Resp 18  Ht 5' (1.524 m)  Wt 161.934 kg (357 lb)  BMI 69.72 kg/m2  LMP 09/23/2013      FHT: Category 1 UC:   regular, every 4-5 minutes, mild SVE:   Dilation: Closed Effacement (%): Thick Station: -3 Exam by:: VStandard CNM at [phone] Pitocin at 8 mu/min  Assessment:  IUP at 40 1/7 weeks Morbid obesity--BMI 69 Type 2 DM--no current meds Hx chronic hypertension--no meds GBS negative Rubella non-immune Possible LGA fetus--8+4 at 38 weeks   Plan: Consulted with Dr. Charlesetta Garibaldi. Will stop pitocin, observe 1-2 hours, then plan Cytotech. Reviewed plan with patient and family. CBG q 4 hours. Reviewed difficulty of IV access--patient elects to maintain saline lock in foot.   Donnel Saxon CNM 08/15/2014, 7:42 AM

## 2014-08-15 NOTE — Progress Notes (Signed)
Subjective: Doing well--ambulated and felt good doing it.  Requested removal of saline lock in her foot, feeling better without it.    Objective: BP 129/68 mmHg  Pulse 73  Temp(Src) 98.4 F (36.9 C) (Oral)  Resp 18  Ht 5' (1.524 m)  Wt 161.934 kg (357 lb)  BMI 69.72 kg/m2  LMP 09/23/2013      CBG (last 3)   Recent Labs  08/15/14 0516 08/15/14 0747  GLUCAP 91 88    FHT: Category 1 UC:   Very mild, q 2-5 min. SVE:   Dilation: Fingertip Effacement (%): 60 Station: -3 Exam by:: Cira Servant, CNM  Cervix slightly softer--unable to get finger into cervix  Assessment:  Induction for DM Morbid obesity Unfavorable cervix  Plan: Continue cervical ripening until foley appropriate or further improvement of cervix occurs. SCDs while in bed. OK for ambulation at present.  Donnel Saxon CNM, [place] 08/15/2014, 5:44 PM

## 2014-08-15 NOTE — Progress Notes (Signed)
IV#1 started in left foot by CRNA after attempts by [facility], JDaley RNC, VStandard CNM, and CRNA

## 2014-08-16 ENCOUNTER — Encounter (HOSPITAL_COMMUNITY): Payer: Self-pay | Admitting: Anesthesiology

## 2014-08-16 ENCOUNTER — Encounter (HOSPITAL_COMMUNITY): Payer: Self-pay

## 2014-08-16 ENCOUNTER — Inpatient Hospital Stay (HOSPITAL_COMMUNITY): Payer: 59 | Admitting: Anesthesiology

## 2014-08-16 DIAGNOSIS — R6252 Short stature (child): Secondary | ICD-10-CM

## 2014-08-16 LAB — CBC
HEMATOCRIT: 31.9 % — AB (ref 36.0–46.0)
HEMOGLOBIN: 10.7 g/dL — AB (ref 12.0–15.0)
MCH: 26.2 pg (ref 26.0–34.0)
MCHC: 33.5 g/dL (ref 30.0–36.0)
MCV: 78.2 fL (ref 78.0–100.0)
Platelets: 237 10*3/uL (ref 150–400)
RBC: 4.08 MIL/uL (ref 3.87–5.11)
RDW: 17.2 % — ABNORMAL HIGH (ref 11.5–15.5)
WBC: 11.8 10*3/uL — ABNORMAL HIGH (ref 4.0–10.5)

## 2014-08-16 LAB — GLUCOSE, CAPILLARY
GLUCOSE-CAPILLARY: 100 mg/dL — AB (ref 65–99)
GLUCOSE-CAPILLARY: 84 mg/dL (ref 65–99)
GLUCOSE-CAPILLARY: 92 mg/dL (ref 65–99)
Glucose-Capillary: 95 mg/dL (ref 65–99)
Glucose-Capillary: 92 mg/dL (ref 65–99)
Glucose-Capillary: 92 mg/dL (ref 65–99)

## 2014-08-16 MED ORDER — OXYTOCIN 40 UNITS IN LACTATED RINGERS INFUSION - SIMPLE MED
1.0000 m[IU]/min | INTRAVENOUS | Status: DC
  Administered 2014-08-16: 1 m[IU]/min via INTRAVENOUS
  Filled 2014-08-16: qty 1000

## 2014-08-16 MED ORDER — FENTANYL 2.5 MCG/ML BUPIVACAINE 1/10 % EPIDURAL INFUSION (WH - ANES)
14.0000 mL/h | INTRAMUSCULAR | Status: DC | PRN
  Administered 2014-08-16: 12 mL/h via EPIDURAL
  Administered 2014-08-16 – 2014-08-17 (×3): 14 mL/h via EPIDURAL
  Filled 2014-08-16 (×3): qty 125

## 2014-08-16 MED ORDER — EPHEDRINE 5 MG/ML INJ: 10.0000 mg | INTRAVENOUS | Status: DC | PRN

## 2014-08-16 MED ORDER — DIPHENHYDRAMINE HCL 50 MG/ML IJ SOLN: 12.5000 mg | INTRAMUSCULAR | Status: DC | PRN

## 2014-08-16 MED ORDER — PHENYLEPHRINE 40 MCG/ML (10ML) SYRINGE FOR IV PUSH (FOR BLOOD PRESSURE SUPPORT)
80.0000 ug | PREFILLED_SYRINGE | INTRAVENOUS | Status: AC | PRN
  Administered 2014-08-17 (×3): 80 ug via INTRAVENOUS
  Administered 2014-08-17: 40 ug via INTRAVENOUS
  Administered 2014-08-17 (×3): 80 ug via INTRAVENOUS
  Administered 2014-08-17 (×2): 40 ug via INTRAVENOUS
  Administered 2014-08-17 (×4): 80 ug via INTRAVENOUS
  Filled 2014-08-16: qty 20

## 2014-08-16 MED ORDER — NALBUPHINE HCL 10 MG/ML IJ SOLN
10.0000 mg | INTRAMUSCULAR | Status: DC | PRN
  Administered 2014-08-16: 10 mg via INTRAVENOUS
  Filled 2014-08-16 (×2): qty 1

## 2014-08-16 NOTE — Progress Notes (Addendum)
  Subjective: Sleeping, yet easily aroused. Reports relief from one dose of Nubain at 0238. Spouse at bedside.  Objective: BP 110/52 mmHg  Pulse 66  Temp(Src) 98.3 F (36.8 C) (Oral)  Resp 20  Ht 5' (1.524 m)  Wt 161.934 kg (357 lb)  BMI 69.72 kg/m2  LMP 09/23/2013     Today's Vitals   08/16/14 0235 08/16/14 0242 08/16/14 0334 08/16/14 0400  BP:  133/80 110/52   Pulse:  70 66   Temp:   98.3 F (36.8 C)   TempSrc:      Resp:  20 20   Height:      Weight:      PainSc: 8  3   Asleep   FHT: BL 130 w/ moderate variability -- some segments of what appears to be a sleep cycle. +accels (10x10), earlys and occ mild variable  UC:   irregular, every 2-4 minutes SVE: Deferred Foley bulb taut w/ traction  Assessment:  IOL 2/2 Type 2 DM. GBS neg. Elevated BMI at 70. Short stature. Normal CBGs. Rubella non-immune  Plan: Continue IOL. Await foley bulb extrusion, then Pitocin. Couple aware of plan. MMR pp. Dr. Simona Huh updated.  Farrel Gordon CNM 08/16/2014, 6:47 AM

## 2014-08-16 NOTE — Anesthesia Preprocedure Evaluation (Deleted)
Anesthesia Evaluation  Patient identified by MRN, date of birth, ID band Patient awake    Reviewed: Allergy & Precautions, Patient's Chart, lab work & pertinent test results  Airway Mallampati: III  TM Distance: >3 FB Neck ROM: Full    Dental no notable dental hx. (+) Teeth Intact   Pulmonary sleep apnea and Continuous Positive Airway Pressure Ventilation , pneumonia -, resolved,  breath sounds clear to auscultation  Pulmonary exam normal       Cardiovascular hypertension, Normal cardiovascular examRhythm:Regular Rate:Normal     Neuro/Psych negative neurological ROS  negative psych ROS   GI/Hepatic Neg liver ROS, GERD-  Medicated and Controlled,S/P Gastric sleeve 2014 lost greater than 100 lbs then regained weight during pregnancy   Endo/Other  diabetes, Well Controlled, Type 2Morbid obesitySuper MO   Renal/GU negative Renal ROS  negative genitourinary   Musculoskeletal negative musculoskeletal ROS (+)   Abdominal (+) + obese,   Peds  Hematology  (+) anemia ,   Anesthesia Other Findings   Reproductive/Obstetrics (+) Pregnancy 40 wks. IOL                             Anesthesia Physical Anesthesia Plan  ASA: III  Anesthesia Plan: Epidural   Post-op Pain Management:    Induction:   Airway Management Planned: Natural Airway  Additional Equipment:   Intra-op Plan:   Post-operative Plan:   Informed Consent: I have reviewed the patients History and Physical, chart, labs and discussed the procedure including the risks, benefits and alternatives for the proposed anesthesia with the patient or authorized representative who has indicated his/her understanding and acceptance.     Plan Discussed with: Anesthesiologist  Anesthesia Plan Comments: (I had a long discussion with Evelyn Wade regarding epidural placement for labor. She understands the risks, benefits and alternatives. Her  questions were answered.)        Anesthesia Quick Evaluation

## 2014-08-16 NOTE — Progress Notes (Addendum)
  Subjective: Doing well--had chance to shower and ambulate.  Aware of some UCs.  Mother and husband at bedside.  Objective: BP 130/67 mmHg  Pulse 72  Temp(Src) 97.8 F (36.6 C) (Oral)  Resp 20  Ht 5' (1.524 m)  Wt 161.934 kg (357 lb)  BMI 69.72 kg/m2  LMP 09/23/2013      CBG (last 3)   Recent Labs  08/16/14 0456 08/16/14 0958 08/16/14 1359  GLUCAP 92 95 92      FHT: Category 1 UC:   regular, every 4-5 minutes, mild SVE:   Dilation: 3 Effacement (%): 70 Station: -3 Exam by:: V Latham CNM  Foley bulb removed without difficulty. Bedside US for presentation, due to pp OOP--vtx, slightly oblique to maternal left. Dr. Alesia Richards at bedside during evaluation.  Assessment:  Induction for Type 2 DM Morbid obesity GBS negative Chronic hypertension--no meds Rubella non-immune Possible LGA fetus--8+4 at 38 weeks  Plan: Plan pitocin per low-dose protocol. Anticipate AROM with descent of vtx, with implementation of internal monitoring. Anesthesia saw patient this am for evaluation--patient plans epidural as labor advances. Reviewed plan of care with patient and family--they are agreeable with plans and ready to proceed.   Donnel Saxon CNM 08/16/2014, 2:37 PM  I saw and examined patient at bedside and agree with above findings, assessment and plan. Dr. Alesia Richards.

## 2014-08-16 NOTE — Progress Notes (Signed)
Subjective: Patient in recliner--feels more comfortable than in the bed.  Not aware of any contractions.  Objective: BP 125/71 mmHg  Pulse 76  Temp(Src) 98.1 F (36.7 C) (Oral)  Resp 20  Ht 5' (1.524 m)  Wt 161.934 kg (357 lb)  BMI 69.72 kg/m2  LMP 09/23/2013      FHT: Category 1 UC:   Irregular, mild SVE:   Dilation: 3 Effacement (%): 70 Station: -3 Exam by:: Windy Kalata CNM at 2:37p with foley removal Pitocin at 10 mu/min  Assessment:  Induction for Type 2 DM--no meds Morbid obesity S/p foley bulb out at 2:37p  Plan: Continue current care. Plan recheck of cervix in 1-2 hours to evaluate for possible AROM.  Donnel Saxon CNM, [place] 08/16/2014, 7:06 PM

## 2014-08-16 NOTE — Progress Notes (Signed)
  Subjective: Aware of contractions, received Nubain at 0238 with benefit.  Would like to shower this am.  Objective: BP 110/52 mmHg  Pulse 66  Temp(Src) 98.3 F (36.8 C) (Oral)  Resp 20  Ht 5' (1.524 m)  Wt 161.934 kg (357 lb)  BMI 69.72 kg/m2  LMP 09/23/2013     FHT: Category 1 UC:   regular, every 3-5 minutes SVE:  1 cm, 60%, vtx, -3 at 0158 by Farrel Gordon, CNM, with foley placement. Foley still in place to gentle traction  CBG (last 3)   Recent Labs  08/15/14 2009 08/16/14 0011 08/16/14 0456  GLUCAP 79 100* 92    Assessment:  IUP at 40 2/7 weeks Induction for Type 2 DM--no meds Chronic hypertension--no meds GBS negative Rubella non-immune Possible LGA fetus--8+4 at 38 weeks  Plan: Observe for foley extrusion--anticipate initiating pitocin at that time. May shower and have light breakfast this am. Patient plans epidural as labor advances. SCDs when patient confined to bed.  Donnel Saxon CNM 08/16/2014, 7:49 AM

## 2014-08-16 NOTE — Progress Notes (Signed)
Evelyn Wade MRN: [id number]  Subjective: -Care assumed of 43y.o. G1P0 at 40.2wks who presents for IOL secondary to DM-Type 2 and CHTN.  Patient resting in recliner and reports comfort.  No perception of contractions.  Reports good fetal movement.  Husband, Evelyn Wade, and mother at bedside.   Objective: BP 108/76 mmHg  Pulse 74  Temp(Src) 98.1 F (36.7 C) (Oral)  Resp 20  Ht 5' (1.524 m)  Wt 161.934 kg (357 lb)  BMI 69.72 kg/m2  LMP 09/23/2013     FHT: 125 bpm, Mod Var, -Decels, +Accels UC: Q3-66min, palpates moderate   SVE:   Dilation: 4 Effacement (%): 70 Station: -3 Exam by:: Evelyn Wade Membranes:AROM; Large Amt Yellow Fluid IUPC inserted, FSE attempted x2 without success Pitocin:25mUn/min  Assessment:  IUP at 40.2wks Cat I FT  Morbid Obesity GBS Negative Amniotomy Internal Monitoring  Plan: -Discussed R/B of AROM and internal monitors including increased risk for infection, fetal intolerance, and reduction of total labor time by 2-3 hours. -Patient agrees and understands risks, wishes to proceed with AROM and placement of internal monitors -Patient tolerated procedure well -Unable to successful place FSE -Will continue to monitor -Continue other mgmt as ordered -Will update Evelyn Wade as appropriate  Evelyn Wade LYNN,MSN, CNM 08/16/2014, 8:29 PM

## 2014-08-16 NOTE — Progress Notes (Addendum)
  Subjective: Cramping but declines pain medication. Spouse at bedside.  Objective: BP 128/68 mmHg  Pulse 73  Temp(Src) 98.4 F (36.9 C) (Oral)  Resp 20  Ht 5' (1.524 m)  Wt 161.934 kg (357 lb)  BMI 69.72 kg/m2  LMP 09/23/2013     Today's Vitals   08/15/14 1151 08/15/14 1741 08/15/14 2147 08/16/14 0113  BP: 125/64 129/68 128/68   Pulse: 81 73 73   Temp:  98.4 F (36.9 C) 98.3 F (36.8 C) 98.4 F (36.9 C)  TempSrc:  Oral    Resp:   20 20  Height:      Weight:       FHT: BL 140 w/ moderate variability, - accels, - decels UC:   irregular, every 2-3 minutes SVE: 1/60/-3/soft/posterior    Last Cytotec placed at 2150 Intracervical balloon placed at 0158 initially w/ speculum but unsuccessful, therefore placed blindly and balloon filled w/ 60 ml NS and traction applied on catheter  Assessment:  IUP at 40.2 wks IOL 2/2 Type 2 DM Normal sugars Chronic Hypertension; stable on no meds  Plan: Continue induction. Await FB extrusion.  Farrel Gordon CNM 08/16/2014, 2:02 AM

## 2014-08-17 ENCOUNTER — Encounter (HOSPITAL_COMMUNITY): Payer: Self-pay

## 2014-08-17 ENCOUNTER — Encounter (HOSPITAL_COMMUNITY)
Admission: RE | Disposition: A | Discharge status: Home / Self Care | Payer: Self-pay | Source: Ambulatory Visit | Attending: Obstetrics and Gynecology

## 2014-08-17 DIAGNOSIS — Z98891 History of uterine scar from previous surgery: Secondary | ICD-10-CM

## 2014-08-17 LAB — GLUCOSE, CAPILLARY
Glucose-Capillary: 89 mg/dL (ref 65–99)
Glucose-Capillary: 83 mg/dL (ref 65–99)
Glucose-Capillary: 89 mg/dL (ref 65–99)
Glucose-Capillary: 80 mg/dL (ref 65–99)

## 2014-08-17 SURGERY — Surgical Case
Anesthesia: Epidural

## 2014-08-17 MED ORDER — SIMETHICONE 80 MG PO CHEW
80.0000 mg | CHEWABLE_TABLET | Freq: Three times a day (TID) | ORAL | Status: DC
  Administered 2014-08-18 – 2014-08-20 (×5): 80 mg via ORAL
  Filled 2014-08-17 (×5): qty 1

## 2014-08-17 MED ORDER — NALOXONE HCL 0.4 MG/ML IJ SOLN: 0.4000 mg | INTRAMUSCULAR | Status: DC | PRN

## 2014-08-17 MED ORDER — MORPHINE SULFATE (PF) 0.5 MG/ML IJ SOLN
INTRAMUSCULAR | Status: DC | PRN
  Administered 2014-08-17: 3 mg via EPIDURAL

## 2014-08-17 MED ORDER — IBUPROFEN 600 MG PO TABS
600.0000 mg | ORAL_TABLET | Freq: Four times a day (QID) | ORAL | Status: DC
  Administered 2014-08-17 – 2014-08-20 (×11): 600 mg via ORAL
  Filled 2014-08-17 (×11): qty 1

## 2014-08-17 MED ORDER — OXYTOCIN 10 UNIT/ML IJ SOLN
INTRAMUSCULAR | Status: AC
  Filled 2014-08-17: qty 4

## 2014-08-17 MED ORDER — SIMETHICONE 80 MG PO CHEW
80.0000 mg | CHEWABLE_TABLET | ORAL | Status: DC
  Administered 2014-08-17 – 2014-08-19 (×3): 80 mg via ORAL
  Filled 2014-08-17 (×3): qty 1

## 2014-08-17 MED ORDER — BUPIVACAINE HCL (PF) 0.25 % IJ SOLN
INTRAMUSCULAR | Status: DC | PRN
  Administered 2014-08-16 (×2): 4 mL

## 2014-08-17 MED ORDER — NALBUPHINE HCL 10 MG/ML IJ SOLN: 5.0000 mg | INTRAMUSCULAR | Status: DC | PRN

## 2014-08-17 MED ORDER — OXYCODONE HCL 5 MG/5ML PO SOLN: 5.0000 mg | Freq: Once | ORAL | Status: DC | PRN

## 2014-08-17 MED ORDER — PHENYLEPHRINE 40 MCG/ML (10ML) SYRINGE FOR IV PUSH (FOR BLOOD PRESSURE SUPPORT)
PREFILLED_SYRINGE | INTRAVENOUS | Status: AC
  Filled 2014-08-17: qty 10

## 2014-08-17 MED ORDER — MENTHOL 3 MG MT LOZG: 1.0000 | LOZENGE | OROMUCOSAL | Status: DC | PRN

## 2014-08-17 MED ORDER — KETOROLAC TROMETHAMINE 30 MG/ML IJ SOLN: 30.0000 mg | Freq: Once | INTRAMUSCULAR | Status: DC | PRN

## 2014-08-17 MED ORDER — FENTANYL CITRATE (PF) 100 MCG/2ML IJ SOLN
INTRAMUSCULAR | Status: AC
  Filled 2014-08-17: qty 2

## 2014-08-17 MED ORDER — ONDANSETRON HCL 4 MG/2ML IJ SOLN
INTRAMUSCULAR | Status: AC
  Filled 2014-08-17: qty 2

## 2014-08-17 MED ORDER — DIBUCAINE 1 % RE OINT: 1.0000 "application " | TOPICAL_OINTMENT | RECTAL | Status: DC | PRN

## 2014-08-17 MED ORDER — KETOROLAC TROMETHAMINE 30 MG/ML IJ SOLN: 30.0000 mg | Freq: Four times a day (QID) | INTRAMUSCULAR | Status: DC | PRN

## 2014-08-17 MED ORDER — 0.9 % SODIUM CHLORIDE (POUR BTL) OPTIME
TOPICAL | Status: DC | PRN
  Administered 2014-08-17: 1000 mL

## 2014-08-17 MED ORDER — NALOXONE HCL 1 MG/ML IJ SOLN: 1.0000 ug/kg/h | INTRAVENOUS | Status: DC | PRN

## 2014-08-17 MED ORDER — SCOPOLAMINE 1 MG/3DAYS TD PT72
MEDICATED_PATCH | TRANSDERMAL | Status: DC | PRN
  Administered 2014-08-17: 1 via TRANSDERMAL

## 2014-08-17 MED ORDER — PHENYLEPHRINE HCL 10 MG/ML IJ SOLN: INTRAMUSCULAR | Status: DC | PRN

## 2014-08-17 MED ORDER — MORPHINE SULFATE 0.5 MG/ML IJ SOLN
INTRAMUSCULAR | Status: AC
  Filled 2014-08-17: qty 10

## 2014-08-17 MED ORDER — DIPHENHYDRAMINE HCL 25 MG PO CAPS
25.0000 mg | ORAL_CAPSULE | Freq: Four times a day (QID) | ORAL | Status: DC | PRN
  Filled 2014-08-17: qty 1

## 2014-08-17 MED ORDER — ACETAMINOPHEN 500 MG PO TABS
1000.0000 mg | ORAL_TABLET | Freq: Four times a day (QID) | ORAL | Status: AC
Start: 2014-08-17 — End: 2014-08-18
  Administered 2014-08-17 – 2014-08-18 (×3): 1000 mg via ORAL
  Filled 2014-08-17 (×3): qty 2

## 2014-08-17 MED ORDER — LACTATED RINGERS IV SOLN
INTRAVENOUS | Status: DC
  Administered 2014-08-17: 23:00:00 via INTRAVENOUS

## 2014-08-17 MED ORDER — NALBUPHINE HCL 10 MG/ML IJ SOLN: 5.0000 mg | Freq: Once | INTRAMUSCULAR | Status: AC | PRN

## 2014-08-17 MED ORDER — OXYCODONE-ACETAMINOPHEN 5-325 MG PO TABS
1.0000 | ORAL_TABLET | ORAL | Status: DC | PRN
Start: 2014-08-17 — End: 2014-08-20
  Administered 2014-08-19 – 2014-08-20 (×5): 1 via ORAL
  Filled 2014-08-17 (×6): qty 1

## 2014-08-17 MED ORDER — IBUPROFEN 600 MG PO TABS: 600.0000 mg | ORAL_TABLET | Freq: Four times a day (QID) | ORAL | Status: DC | PRN

## 2014-08-17 MED ORDER — LACTATED RINGERS IV SOLN
INTRAVENOUS | Status: DC
  Administered 2014-08-17: 300 mL via INTRAUTERINE
  Administered 2014-08-17 (×4): via INTRAUTERINE

## 2014-08-17 MED ORDER — SCOPOLAMINE 1 MG/3DAYS TD PT72
MEDICATED_PATCH | TRANSDERMAL | Status: AC
  Filled 2014-08-17: qty 1

## 2014-08-17 MED ORDER — ONDANSETRON HCL 4 MG/2ML IJ SOLN: 4.0000 mg | Freq: Three times a day (TID) | INTRAMUSCULAR | Status: DC | PRN

## 2014-08-17 MED ORDER — PROMETHAZINE HCL 25 MG/ML IJ SOLN: 6.2500 mg | INTRAMUSCULAR | Status: DC | PRN

## 2014-08-17 MED ORDER — SODIUM BICARBONATE 8.4 % IV SOLN
INTRAVENOUS | Status: AC
  Filled 2014-08-17: qty 50

## 2014-08-17 MED ORDER — MEPERIDINE HCL 25 MG/ML IJ SOLN: 6.2500 mg | INTRAMUSCULAR | Status: DC | PRN

## 2014-08-17 MED ORDER — OXYTOCIN 40 UNITS IN LACTATED RINGERS INFUSION - SIMPLE MED: 62.5000 mL/h | INTRAVENOUS | Status: AC

## 2014-08-17 MED ORDER — LIDOCAINE-EPINEPHRINE (PF) 2 %-1:200000 IJ SOLN
INTRAMUSCULAR | Status: AC
  Filled 2014-08-17: qty 20

## 2014-08-17 MED ORDER — ZOLPIDEM TARTRATE 5 MG PO TABS: 5.0000 mg | ORAL_TABLET | Freq: Every evening | ORAL | Status: DC | PRN

## 2014-08-17 MED ORDER — FENTANYL CITRATE (PF) 100 MCG/2ML IJ SOLN
INTRAMUSCULAR | Status: DC | PRN
  Administered 2014-08-17 (×2): 50 ug via INTRAVENOUS
  Administered 2014-08-17: 100 ug via INTRAVENOUS

## 2014-08-17 MED ORDER — HYDROMORPHONE HCL 1 MG/ML IJ SOLN
INTRAMUSCULAR | Status: AC
  Filled 2014-08-17: qty 1

## 2014-08-17 MED ORDER — PRENATAL MULTIVITAMIN CH
1.0000 | ORAL_TABLET | Freq: Every day | ORAL | Status: DC
  Administered 2014-08-18 – 2014-08-20 (×3): 1 via ORAL
  Filled 2014-08-17 (×3): qty 1

## 2014-08-17 MED ORDER — SENNOSIDES-DOCUSATE SODIUM 8.6-50 MG PO TABS
2.0000 | ORAL_TABLET | ORAL | Status: DC
  Administered 2014-08-17 – 2014-08-19 (×3): 2 via ORAL
  Filled 2014-08-17 (×3): qty 2

## 2014-08-17 MED ORDER — OXYTOCIN 10 UNIT/ML IJ SOLN
40.0000 [IU] | INTRAMUSCULAR | Status: DC | PRN
  Administered 2014-08-17: 40 [IU] via INTRAVENOUS

## 2014-08-17 MED ORDER — LANOLIN HYDROUS EX OINT: 1.0000 "application " | TOPICAL_OINTMENT | CUTANEOUS | Status: DC | PRN

## 2014-08-17 MED ORDER — LIDOCAINE-EPINEPHRINE (PF) 2 %-1:200000 IJ SOLN
INTRAMUSCULAR | Status: DC | PRN
  Administered 2014-08-16: 3 mL

## 2014-08-17 MED ORDER — OXYCODONE-ACETAMINOPHEN 5-325 MG PO TABS
2.0000 | ORAL_TABLET | ORAL | Status: DC | PRN
  Administered 2014-08-20: 2 via ORAL
  Filled 2014-08-17: qty 2

## 2014-08-17 MED ORDER — SCOPOLAMINE 1 MG/3DAYS TD PT72: 1.0000 | MEDICATED_PATCH | Freq: Once | TRANSDERMAL | Status: DC

## 2014-08-17 MED ORDER — ACETAMINOPHEN 325 MG PO TABS: 650.0000 mg | ORAL_TABLET | ORAL | Status: DC | PRN

## 2014-08-17 MED ORDER — DEXTROSE 5 % IV SOLN
3.0000 g | INTRAVENOUS | Status: DC | PRN
  Administered 2014-08-17: 3 g via INTRAVENOUS

## 2014-08-17 MED ORDER — LACTATED RINGERS IV SOLN
INTRAVENOUS | Status: DC | PRN
  Administered 2014-08-17 (×2): via INTRAVENOUS

## 2014-08-17 MED ORDER — DEXTROSE 5 % IV SOLN
INTRAVENOUS | Status: AC
  Filled 2014-08-17: qty 3000

## 2014-08-17 MED ORDER — DIPHENHYDRAMINE HCL 25 MG PO CAPS
25.0000 mg | ORAL_CAPSULE | ORAL | Status: DC | PRN
  Administered 2014-08-18: 25 mg via ORAL

## 2014-08-17 MED ORDER — SODIUM CHLORIDE 0.9 % IJ SOLN: 3.0000 mL | INTRAMUSCULAR | Status: DC | PRN

## 2014-08-17 MED ORDER — SODIUM BICARBONATE 8.4 % IV SOLN
INTRAVENOUS | Status: DC | PRN
  Administered 2014-08-17 (×2): 5 mL via EPIDURAL
  Administered 2014-08-17: 2 mL via EPIDURAL
  Administered 2014-08-17: 5 mL via EPIDURAL
  Administered 2014-08-17: 3 mL via EPIDURAL

## 2014-08-17 MED ORDER — ONDANSETRON HCL 4 MG/2ML IJ SOLN
INTRAMUSCULAR | Status: DC | PRN
  Administered 2014-08-17: 4 mg via INTRAVENOUS

## 2014-08-17 MED ORDER — WITCH HAZEL-GLYCERIN EX PADS: 1.0000 "application " | MEDICATED_PAD | CUTANEOUS | Status: DC | PRN

## 2014-08-17 MED ORDER — DIPHENHYDRAMINE HCL 50 MG/ML IJ SOLN: 12.5000 mg | INTRAMUSCULAR | Status: DC | PRN

## 2014-08-17 MED ORDER — SIMETHICONE 80 MG PO CHEW: 80.0000 mg | CHEWABLE_TABLET | ORAL | Status: DC | PRN

## 2014-08-17 MED ORDER — OXYCODONE HCL 5 MG PO TABS: 5.0000 mg | ORAL_TABLET | Freq: Once | ORAL | Status: DC | PRN

## 2014-08-17 MED ORDER — HYDROMORPHONE HCL 1 MG/ML IJ SOLN
0.2500 mg | INTRAMUSCULAR | Status: DC | PRN
  Administered 2014-08-17: 0.5 mg via INTRAVENOUS

## 2014-08-17 MED ORDER — TETANUS-DIPHTH-ACELL PERTUSSIS 5-2.5-18.5 LF-MCG/0.5 IM SUSP
0.5000 mL | Freq: Once | INTRAMUSCULAR | Status: AC
  Administered 2014-08-18: 0.5 mL via INTRAMUSCULAR
  Filled 2014-08-17: qty 0.5

## 2014-08-17 SURGICAL SUPPLY — 39 items
BENZOIN TINCTURE PRP APPL 2/3 (GAUZE/BANDAGES/DRESSINGS) ×2 IMPLANT
CLAMP CORD UMBIL (MISCELLANEOUS) IMPLANT
CLOTH BEACON ORANGE TIMEOUT ST (SAFETY) ×2 IMPLANT
CONTAINER PREFILL 10% NBF 15ML (MISCELLANEOUS) IMPLANT
DRAIN JACKSON PRT FLT 10 (DRAIN) ×2 IMPLANT
DRAPE SHEET LG 3/4 BI-LAMINATE (DRAPES) IMPLANT
DRSG OPSITE 11X17.75 LRG (GAUZE/BANDAGES/DRESSINGS) ×2 IMPLANT
DRSG OPSITE POSTOP 4X10 (GAUZE/BANDAGES/DRESSINGS) ×2 IMPLANT
DURAPREP 26ML APPLICATOR (WOUND CARE) ×4 IMPLANT
ELECT REM PT RETURN 9FT ADLT (ELECTROSURGICAL) ×2
ELECTRODE REM PT RTRN 9FT ADLT (ELECTROSURGICAL) ×1 IMPLANT
EVACUATOR SILICONE 100CC (DRAIN) ×2 IMPLANT
EXTRACTOR VACUUM M CUP 4 TUBE (SUCTIONS) IMPLANT
GLOVE BIO SURGEON STRL SZ7.5 (GLOVE) ×2 IMPLANT
GLOVE BIOGEL PI IND STRL 7.5 (GLOVE) ×1 IMPLANT
GLOVE BIOGEL PI INDICATOR 7.5 (GLOVE) ×1
GOWN STRL REUS W/TWL LRG LVL3 (GOWN DISPOSABLE) ×4 IMPLANT
KIT ABG SYR 3ML LUER SLIP (SYRINGE) IMPLANT
NEEDLE HYPO 25X5/8 SAFETYGLIDE (NEEDLE) IMPLANT
NS IRRIG 1000ML POUR BTL (IV SOLUTION) ×2 IMPLANT
PACK C SECTION WH (CUSTOM PROCEDURE TRAY) ×2 IMPLANT
PAD OB MATERNITY 4.3X12.25 (PERSONAL CARE ITEMS) ×2 IMPLANT
RETAINER VISCERAL (MISCELLANEOUS) ×2 IMPLANT
RETRACTOR TRAXI PANNICULUS (MISCELLANEOUS) ×1 IMPLANT
RETRACTOR WOUND ALXS 34CM XLRG (MISCELLANEOUS) ×1 IMPLANT
RTRCTR C-SECT PINK 25CM LRG (MISCELLANEOUS) IMPLANT
RTRCTR WOUND ALEXIS 34CM XLRG (MISCELLANEOUS) ×2
STRIP CLOSURE SKIN 1/2X4 (GAUZE/BANDAGES/DRESSINGS) ×2 IMPLANT
SUT CHROMIC 2 0 CT 1 (SUTURE) ×2 IMPLANT
SUT MNCRL AB 3-0 PS2 27 (SUTURE) ×2 IMPLANT
SUT PLAIN 0 NONE (SUTURE) IMPLANT
SUT PLAIN 2 0 XLH (SUTURE) ×8 IMPLANT
SUT SILK 2 0 FS (SUTURE) ×2 IMPLANT
SUT VIC AB 0 CT1 36 (SUTURE) ×2 IMPLANT
SUT VIC AB 0 CTX 36 (SUTURE) ×3
SUT VIC AB 0 CTX36XBRD ANBCTRL (SUTURE) ×3 IMPLANT
TOWEL OR 17X24 6PK STRL BLUE (TOWEL DISPOSABLE) ×2 IMPLANT
TRAXI PANNICULUS RETRACTOR (MISCELLANEOUS) ×1
TRAY FOLEY CATH SILVER 14FR (SET/KITS/TRAYS/PACK) ×2 IMPLANT

## 2014-08-17 NOTE — Progress Notes (Signed)
JEZLYN WESTERFIELD MRN: [id number]  Subjective: -Patient resting in bed.  Reports comfort s/p epidural.  Denies vaginal/rectal pressure.   Objective: BP 128/70 mmHg  Pulse 72  Temp(Src) 98.8 F (37.1 C) (Oral)  Resp 20  Ht 5' (1.524 m)  Wt 161.934 kg (357 lb)  BMI 69.72 kg/m2  SpO2 100%  LMP 09/23/2013      Results for orders placed or performed during the hospital encounter of 08/15/14 (from the past 24 hour(s))  Glucose, capillary     Status: None   Collection Time: 08/16/14  4:56 AM  Result Value Ref Range   Glucose-Capillary 92 65 - 99 mg/dL  Glucose, capillary     Status: None   Collection Time: 08/16/14  9:58 AM  Result Value Ref Range   Glucose-Capillary 95 65 - 99 mg/dL  Glucose, capillary     Status: None   Collection Time: 08/16/14  1:59 PM  Result Value Ref Range   Glucose-Capillary 92 65 - 99 mg/dL  Glucose, capillary     Status: None   Collection Time: 08/16/14  6:28 PM  Result Value Ref Range   Glucose-Capillary 84 65 - 99 mg/dL  CBC     Status: Abnormal   Collection Time: 08/16/14  9:14 PM  Result Value Ref Range   WBC 11.8 (H) 4.0 - 10.5 K/uL   RBC 4.08 3.87 - 5.11 MIL/uL   Hemoglobin 10.7 (L) 12.0 - 15.0 g/dL   HCT 31.9 (L) 36.0 - 46.0 %   MCV 78.2 78.0 - 100.0 fL   MCH 26.2 26.0 - 34.0 pg   MCHC 33.5 30.0 - 36.0 g/dL   RDW 17.2 (H) 11.5 - 15.5 %   Platelets 237 150 - 400 K/uL  Glucose, capillary     Status: None   Collection Time: 08/16/14 10:49 PM  Result Value Ref Range   Glucose-Capillary 92 65 - 99 mg/dL    FHT: 145 bpm, Mod Var, + Variable Decels, +Accels UC: Q1-4, MVUs 255mmHg   SVE:   Dilation: 4 Effacement (%): 70 Station: -3 Exam by:: J.Roseanna Koplin Membranes:AROM Pitocin:52mUn/min  Assessment:  IUP at 40.3wks Cat II FT  Morbid Obesity DM-T2 CHTN  Plan: -Position change to resolve Cat I FT -Discussed SVE -BG levels WNL, continue to check every 4 hrs -BP elevated but within parameters -Continue other mgmt as ordered  Shailyn Weyandt,  Agripina Guyette LYNN,MSN, CNM 08/17/2014, 12:26 AM

## 2014-08-17 NOTE — Consult Note (Signed)
Neonatology Note:   Attendance at C-section:    I was asked by Dr. Mancel Bale to attend this primary C/S at term due to FTP. The mother is a G1P0 O pos, GBS neg with GDM, well-controlled on diet alone. ROM 20 hours prior to delivery, fluid clear. Infant vigorous with good spontaneous cry and tone. Needed no suctioning. Ap 9/9. Lungs clear to ausc in DR. To CN to care of Pediatrician.   Real Cons, MD

## 2014-08-17 NOTE — Progress Notes (Signed)
Labor Progress  Subjective: con't with labor POC vs CS reviewed.  Pt would prefer a vags delivery and weill to continue on labor course  Objective: BP 133/72 mmHg  Pulse 73  Temp(Src) 98.2 F (36.8 C) (Oral)  Resp 20  Ht 5' (1.524 m)  Wt 357 lb (161.934 kg)  BMI 69.72 kg/m2  SpO2 99%  LMP 09/23/2013   Total I/O In: -  Out: 800 [Urine:800] FHT: 140, moderate variability, + accel, occasional early decel, SP variable decels earlier this morning CTX:  regular, every 47 minutes Uterus gravid, soft non tender SVE:  Dilation: 4.5 Effacement (%): 70 Station: -2, -3 Exam by:: Baillie Mohammad Pitocin at 50mUn/min  Assessment:  IUP at 40.3 weeks NICHD: Category 2 Membranes:  AROM x 14hrs, no s/s of infection Labor progress: Inadquate labor MVUs 100 Head well applied to cervix  Pitocin Augmentation GBS: negtive A1GDM: stable sugars 89 at 0530   Plan: Continue labor plan Continuous monitoring Rest Will reassess with cervical exam at 1300 or earlier if necessary Continue pitocin per protocol Dr Mancel Bale consulted, strip reviewed     Evelyn Wade, CNM, MSN 08/17/2014. 10:22 AM

## 2014-08-17 NOTE — Transfer of Care (Signed)
Immediate Anesthesia Transfer of Care Note  Patient: Evelyn Wade  Procedure(s) Performed: Procedure(s): CESAREAN SECTION (N/A)  Patient Location: PACU  Anesthesia Type:Epidural  Level of Consciousness: awake, alert , oriented and patient cooperative  Airway & Oxygen Therapy: Patient Spontanous Breathing  Post-op Assessment: Report given to RN and Post -op Vital signs reviewed and stable  Post vital signs: Reviewed and stable  Last Vitals:  BP109/50 HR 76 RR 16 TEMP 03.5 [id number] 98  Complications: No apparent anesthesia complications

## 2014-08-17 NOTE — Progress Notes (Signed)
S: In to check on pt. Reports doing well. Denies discomfort. Pleased with care. Several family members in room.  O:  Today's Vitals   08/17/14 1830 08/17/14 1845 08/17/14 1900 08/17/14 2006  BP: 108/64 113/70  103/52  Pulse: 70 110 66 65  Temp:    98.3 F (36.8 C)  TempSrc:      Resp: 22 26 8 24   Height:      Weight:      SpO2: 98% 96% 96% 99%  PainSc:   4  0-No pain   CBG 80 at 1809  JP -- 30 ml in 5 hrs   A: 44 yo G1 now P1 s/p LTCS due to NRFHRT and failure to progress Type 2 DM - no meds Chronic HTN - no meds  P: Desires inpatient circumcision - R/B of circ reviewed; consent obtained and placed in binder.   Farrel Gordon, CNM  08/17/14, 9:55 PM

## 2014-08-17 NOTE — Op Note (Signed)
Cesarean Section Procedure Note  Indications: P0 at 77 3/wks with T2DM and CHTN undergoing induction with failure to progress and fetal intolerance of labor  Pre-operative Diagnosis: primary cesarean section for non-reassuring fetal heart rate and failure to progress   Post-operative Diagnosis: non-reassuring fetal heart rate and failure to progress  Procedure: PRIMARY LOW TRANSVERSE CESAREAN SECTION  Surgeon: Everett Graff, MD    Assistants: Sallee Provencal, CNM  Anesthesia: Epidural  Anesthesiologist: Lyn Hollingshead, MD   Procedure Details  The patient was taken to the operating room secondary to FTP and fetal intolerance of labor after the risks, benefits, complications, treatment options, and expected outcomes were discussed with the patient.  The patient concurred with the proposed plan, giving informed consent which was signed and witnessed. The patient was taken to Operating Room C-Section Suite, identified as Evelyn Wade and the procedure verified as C-Section Delivery. A Time Out was held and the above information confirmed.  After induction of anesthesia by obtaining a spinal, the patient was prepped and draped in the usual sterile manner. A Pfannenstiel skin incision was made and carried down through the subcutaneous tissue to the underlying layer of fascia.  The fascia was incised bilaterally and extended transversely bilaterally with the Mayo scissors. Kocher clamps were placed on the inferior aspect of the fascial incision and the underlying rectus muscle was separated from the fascia. The same was done on the superior aspect of the fascial incision.  The peritoneum was identified, entered bluntly and extended manually.  An Alexis self-retaining retractor was placed.  The utero-vesical peritoneal reflection was incised transversely and the bladder flap was bluntly freed from the lower uterine segment. A low transverse uterine incision was made with the scalpel and extended  bilaterally with the bandage scissors.  The infant was delivered in vertex position without difficulty.  After the umbilical cord was clamped and cut, the infant was handed to the awaiting pediatricians.  Cord blood was obtained for evaluation.  The placenta was removed intact and appeared to be within normal limits. The uterus was cleared of all clots and debris. The uterine incision was closed with running interlocking sutures of 0 Vicryl and a second imbricating layer was performed as well.   Bilateral tubes and ovaries appeared to be within normal limits.  Good hemostasis was noted.  Copious irrigation was performed until clear.  The peritoneum was repaired with 2-0 chromic via a running suture.  The fascia was reapproximated with a running suture of 0 Vicryl and a size 10 JP drain was placed in SQ tissue.  The subcutaneous tissue was reapproximated with several interrupted sutures of 2-0 plain and a running stitch of 2-0 plain in layers.  The skin was reapproximated with a subcuticular suture of 3-0 monocryl.  Steristrips were applied.  Instrument, sponge, and needle counts were correct prior to abdominal closure and at the conclusion of the case.  The patient was awaiting transfer to the recovery room in good condition.  Findings: Live female infant with Apgars 9 at one minute and 9 at five minutes.  Normal appearing bilateral ovaries and fallopian tubes were noted.  Estimated Blood Loss:  1400 ml         Drains: foley to gravity 80 cc         Total IV Fluids: 4700 ml         Specimens to Pathology: Placenta         Complications:  None; patient tolerated the procedure well.  Disposition: PACU - hemodynamically stable.         Condition: stable  Attending Attestation: I performed the procedure.

## 2014-08-17 NOTE — Progress Notes (Signed)
Labor Progress  Subjective: No complaints trying to sleep.  R&B of a CS discussed.  Pt agreed to a primary CS  Objective: BP 144/72 mmHg  Pulse 79  Temp(Src) 98.9 F (37.2 C) (Oral)  Resp 18  Ht 5' (1.524 m)  Wt 357 lb (161.934 kg)  BMI 69.72 kg/m2  SpO2 99%  LMP 09/23/2013   Total I/O In: -  Out: 800 [Urine:800] FHT: 155, min-mod variability, early & variable decel CTX:  regular, every 3-4 minutes Uterus gravid, soft non tender SVE:  Dilation: 4.5 Effacement (%): 70 Station: -2, -3 Exam by:: Dmoni Fortson,cnm Pitocin at 60mUn/min  Assessment:  IUP at 40.3 weeks NICHD: Category 2 Failure to progress   Plan: DC pitocin Prepare pt for CS Dr Mancel Bale at the bedside     Kamin Niblack, CNM, MSN 08/17/2014. 3:13 PM

## 2014-08-17 NOTE — Anesthesia Preprocedure Evaluation (Addendum)
Anesthesia Evaluation  Patient identified by MRN, date of birth, ID band Patient awake    Reviewed: Allergy & Precautions, Patient's Chart, lab work & pertinent test results  Airway Mallampati: III  TM Distance: >3 FB Neck ROM: Full    Dental no notable dental hx. (+) Teeth Intact   Pulmonary sleep apnea and Continuous Positive Airway Pressure Ventilation , pneumonia -, resolved,  breath sounds clear to auscultation  Pulmonary exam normal       Cardiovascular hypertension, Normal cardiovascular examRhythm:Regular Rate:Normal     Neuro/Psych negative neurological ROS  negative psych ROS   GI/Hepatic Neg liver ROS, GERD-  Medicated and Controlled,S/P Gastric sleeve 2014 lost greater than 100 lbs then regained weight during pregnancy   Endo/Other  diabetes, Well Controlled, Type 2Morbid obesitySuper MO   Renal/GU negative Renal ROS  negative genitourinary   Musculoskeletal negative musculoskeletal ROS (+)   Abdominal (+) + obese,   Peds  Hematology  (+) anemia ,   Anesthesia Other Findings   Reproductive/Obstetrics (+) Pregnancy 40 wks. IOL                            Anesthesia Physical Anesthesia Plan  ASA: III  Anesthesia Plan: Epidural   Post-op Pain Management:    Induction:   Airway Management Planned:   Additional Equipment:   Intra-op Plan:   Post-operative Plan:   Informed Consent: I have reviewed the patients History and Physical, chart, labs and discussed the procedure including the risks, benefits and alternatives for the proposed anesthesia with the patient or authorized representative who has indicated his/her understanding and acceptance.   Dental advisory given  Plan Discussed with: CRNA and Surgeon  Anesthesia Plan Comments: (For C/S with epidural.)       Anesthesia Quick Evaluation

## 2014-08-17 NOTE — Progress Notes (Signed)
Evelyn Wade MRN: [id number]  Subjective: -Feeling increased pressure with contractions.    Objective: BP 141/71 mmHg  Pulse 74  Temp(Src) 98 F (36.7 C) (Oral)  Resp 22  Ht 5' (1.524 m)  Wt 161.934 kg (357 lb)  BMI 69.72 kg/m2  SpO2 96%  LMP 09/23/2013      Results for orders placed or performed during the hospital encounter of 08/15/14 (from the past 24 hour(s))  Glucose, capillary     Status: None   Collection Time: 08/16/14  4:56 AM  Result Value Ref Range   Glucose-Capillary 92 65 - 99 mg/dL  Glucose, capillary     Status: None   Collection Time: 08/16/14  9:58 AM  Result Value Ref Range   Glucose-Capillary 95 65 - 99 mg/dL  Glucose, capillary     Status: None   Collection Time: 08/16/14  1:59 PM  Result Value Ref Range   Glucose-Capillary 92 65 - 99 mg/dL  Glucose, capillary     Status: None   Collection Time: 08/16/14  6:28 PM  Result Value Ref Range   Glucose-Capillary 84 65 - 99 mg/dL  CBC     Status: Abnormal   Collection Time: 08/16/14  9:14 PM  Result Value Ref Range   WBC 11.8 (H) 4.0 - 10.5 K/uL   RBC 4.08 3.87 - 5.11 MIL/uL   Hemoglobin 10.7 (L) 12.0 - 15.0 g/dL   HCT 31.9 (L) 36.0 - 46.0 %   MCV 78.2 78.0 - 100.0 fL   MCH 26.2 26.0 - 34.0 pg   MCHC 33.5 30.0 - 36.0 g/dL   RDW 17.2 (H) 11.5 - 15.5 %   Platelets 237 150 - 400 K/uL  Glucose, capillary     Status: None   Collection Time: 08/16/14 10:49 PM  Result Value Ref Range   Glucose-Capillary 92 65 - 99 mg/dL    FHT: 145 bpm, Mod Var, + Variable Decels, +Accels UC: Q1-4, MVUs 21mmHg   SVE:   Dilation: 4-5, FSE & IUPC in place Effacement (%): 70 Station: -3 Membranes:AROM Pitocin:83mUn/min  Assessment:  IUP at 40.3wks Cat II FT  Morbid Obesity DM-T2 CHTN  Plan: -Plan to reposition pt, internal monitors in place, overall FHT reassuring -Discussed with patient concern for lack of cervical change and possible need for C-section.  She seemed reluctant with this option as she  wishes to continue to try for a vaginal delivery.  Reviewed risk and benefits of vaginal delivery vs C-section, plan to reassess later this am and to review management plan with [facility] -BG levels WNL, continue to check every 4 hrs -BP elevated but within parameters -Continue other mgmt as ordered  Janyth Pupa, DO [phone] (pager) [phone] (office)

## 2014-08-17 NOTE — Anesthesia Postprocedure Evaluation (Signed)
  Anesthesia Post-op Note  Patient: Evelyn Wade  Procedure(s) Performed: Procedure(s): CESAREAN SECTION (N/A)  Patient Location: PACU  Anesthesia Type:Epidural  Level of Consciousness: awake, alert  and oriented  Airway and Oxygen Therapy: Patient Spontanous Breathing  Post-op Pain: none  Post-op Assessment: Post-op Vital signs reviewed, Patient's Cardiovascular Status Stable, Respiratory Function Stable, Patent Airway, No signs of Nausea or vomiting, Pain level controlled, No headache, No backache, Spinal receding and Patient able to bend at knees LLE Motor Response: Purposeful movement LLE Sensation: Increased RLE Motor Response: Purposeful movement RLE Sensation: Increased      Post-op Vital Signs: Reviewed and stable  Last Vitals:  Filed Vitals:   08/17/14 2006  BP: 103/52  Pulse: 65  Temp: 36.8 C  Resp: 24    Complications: No apparent anesthesia complications

## 2014-08-17 NOTE — Anesthesia Procedure Notes (Signed)
Epidural Patient location during procedure: OB  Staffing Anesthesiologist: Jaiel Saraceno, CHRIS Performed by: anesthesiologist   Preanesthetic Checklist Completed: patient identified, surgical consent, pre-op evaluation, timeout performed, IV checked, risks and benefits discussed and monitors and equipment checked  Epidural Patient position: sitting Prep: site prepped and draped and DuraPrep Patient monitoring: heart rate, cardiac monitor, continuous pulse ox and blood pressure Approach: midline Location: L3-L4 Injection technique: LOR saline  Needle:  Needle type: Tuohy  Needle gauge: 17 G Needle length: 15 cm Needle insertion depth: 11 cm Catheter type: closed end flexible Catheter size: 19 Gauge Catheter at skin depth: 18 cm Test dose: negative and 2% lidocaine with Epi 1:200 K  Assessment Events: blood not aspirated, injection not painful, no injection resistance, negative IV test and no paresthesia  Additional Notes H+P and labs checked, risks and benefits discussed with the patient, consent obtained, procedure tolerated well and without complications.  Reason for block:procedure for pain

## 2014-08-17 NOTE — Progress Notes (Addendum)
Labor Progress  Subjective: Comfortable, no complaints  Objective: BP 138/70 mmHg  Pulse 85  Temp(Src) 98.2 F (36.8 C) (Oral)  Resp 18  Ht 5' (1.524 m)  Wt 357 lb (161.934 kg)  BMI 69.72 kg/m2  SpO2 99%  LMP 09/23/2013   Total I/O In: -  Out: 800 [Urine:800] FHT: 140 min-mod variability,  CTX:  regular, every 4-6 minutes Uterus gravid, soft non tender SVE:  Dilation: 4.5 Effacement (%): 70 Station: -2, -3 Exam by:: Verla Bryngelson Pitocin at 39mUn/min  Assessment:  IUP at 40.3 weeks NICHD: Category 2 Membranes:  AROM x 17hrs, no s/s of infection Labor progress: Inadquate labor MVUs 75 Pitocin Augmentation GBS: negative A1GDM: stable sugars 89 at 1317   Plan: Continuous monitoring Frequent position changes to facilitate fetal rotation and descent. Will reassess with cervical exam at 1500or earlier if necessary Continue pitocin per protocol Amnioinfusion with bolus of 300 cc, then 150 cc/hr. Dr Mancel Bale aware, strip reviewed - con't with labor plan    Evelyn Wade, CNM, MSN 08/17/2014. 2:27 PM

## 2014-08-18 LAB — CBC
HCT: 26.8 % — ABNORMAL LOW (ref 36.0–46.0)
Hemoglobin: 8.7 g/dL — ABNORMAL LOW (ref 12.0–15.0)
MCH: 25.4 pg — ABNORMAL LOW (ref 26.0–34.0)
MCHC: 32.5 g/dL (ref 30.0–36.0)
MCV: 78.4 fL (ref 78.0–100.0)
PLATELETS: 207 10*3/uL (ref 150–400)
RBC: 3.42 MIL/uL — AB (ref 3.87–5.11)
RDW: 16.7 % — ABNORMAL HIGH (ref 11.5–15.5)
WBC: 12.5 10*3/uL — ABNORMAL HIGH (ref 4.0–10.5)

## 2014-08-18 LAB — GLUCOSE, CAPILLARY
GLUCOSE-CAPILLARY: 99 mg/dL (ref 65–99)
Glucose-Capillary: 95 mg/dL (ref 65–99)

## 2014-08-18 MED ORDER — FERROUS SULFATE 325 (65 FE) MG PO TABS
325.0000 mg | ORAL_TABLET | Freq: Two times a day (BID) | ORAL | Status: DC
  Administered 2014-08-18 – 2014-08-20 (×5): 325 mg via ORAL
  Filled 2014-08-18 (×5): qty 1

## 2014-08-18 NOTE — Clinical Social Work Maternal (Signed)
CLINICAL SOCIAL WORK MATERNAL/CHILD NOTE  Patient Details  Name: Evelyn Wade MRN: [id number] Date of Birth: 01/16/1971  Date:  08/18/2014  Clinical Social Worker Initiating Note:  Evelyn Latona, LCSW Date/ Time Initiated:  08/18/14/1315     Child's Name:  Evelyn Wade   Legal Guardian:  Evelyn Wade (parents)  Need for Interpreter:  None   Date of Referral:  08/17/14     Reason for Referral:  NICU admission  Referral Source:  NICU   Address:  [address] [place], [place] [address]  Phone number:  [phone]   Household Members:  Spouse   Natural Supports (not living in the home):  Extended Family, Immediate Family   Professional Supports: None   Employment: Full-time   Type of Work: bookkeeping, accounting   Education:    N/A  Financial Resources:  Private Insurance   Other Resources:    None identified  Cultural/Religious Considerations Which May Impact Care:  None reported  Strengths:  Ability to meet basic needs , Pediatrician chosen , Home prepared for child    Risk Factors/Current Problems:  None   Cognitive State:  Able to Concentrate , Alert , Insightful , Goal Oriented , Linear Thinking    Mood/Affect:  Happy , Interested , Comfortable , Calm    CSW Assessment:  CSW completed assessment due to infant's NICU admission.  [name] and [name] presented as easily engaged and receptive to the visit. [name] displayed a full range in affect and presented in a pleasant mood.  CSW met with [name] after return from the NICU, and [name] shared that she is feel "better" since she has seen the infant and feels hopeful that his bilirubin levels are no longer worsening.  [name] shared belief that she is coping well the NICU admission since she knows that jaundice is common, her situation could be worse, and that it is a short term situation.  [name] recognizes that she was not always as positive and optimistic as she reflected upon "having a moment" overnight,  but discussed that she continues to try to be positive in order to enjoy the moments she has with the infant while visiting. [name] shared that she has enjoyed holding and bonding with the infant, and is looking forward to continuing to adjust to her "new normal" of becoming a parent.  [name] shared that she has already anticipated a tearful and difficult separation if she is discharged prior to infant, but discussed that she feels reassured since she is pleased with the infant's care and knows that she can visit at any time.  [name] denied barriers to visiting the infant once she is discharged.   [name] reported that she has a strong support system which she believes will be helpful as she transitions to the postpartum period.  [name] denied history of mental health concerns, and shared that she had a "happy" pregnancy. [name] presented as attentive and engaged while CSW provided education on postpartum depression and anxiety.  [name] shared that she can understand how anxiety symptoms can be common since raising an infant can be filled with worries and fears.  [name] agreed to contact her medical provider if she notes onset of symptoms.    [name] denied additional questions, concerns, or needs at this time. She expressed appreciation for the visit and agreed to contact CSW if needs arise.    CSW Plan/Description:   1)Patient/Family Education: Perinatal mood and anxiety disorders 2)No Further Intervention Required/No Barriers to Discharge; however CSW to   support [name] and [name] at infant's bedside during NICU admission.   Evelyn Wade N, LCSW 08/18/2014, 1:56 PM  

## 2014-08-18 NOTE — Lactation Note (Signed)
This note was copied from the chart of [place]. Lactation Consultation Note  Patient Name: Evelyn Wade MOQHU'T Date: 08/18/2014 Reason for consult: Follow-up assessment  With this mom and term baby, in NICU. Baby sucking on pacifer, but not able to latched to mom's breast. Baby very upset, crying. I tried to apply 20 nipple shiled, but with areola edema, the shield would not form a seal.  I advised mom to wear her bra, to decrease dependent areola edema, and to keep pumping/hand expression.    Maternal Data    Feeding Feeding Type: Breast Fed Nipple Type: Slow - flow (green) Length of feed: 0 min  LATCH Score/Interventions Latch: Too sleepy or reluctant, no latch achieved, no sucking elicited. (crying, fussy) Intervention(s): Skin to skin;Teach feeding cues  Audible Swallowing: None  Type of Nipple: Flat (not able to get a 20 nipple shiled to stay on) Intervention(s): Double electric pump  Comfort (Breast/Nipple): Soft / non-tender (ext large, pendolous breast with some areola edema Mom advised to put her bra on)     Hold (Positioning): Assistance needed to correctly position infant at breast and maintain latch. Intervention(s): Breastfeeding basics reviewed;Support Pillows;Position options;Skin to skin  LATCH Score: 4  Lactation Tools Discussed/Used     Consult Status Consult Status: Follow-up Date: 08/19/14 Follow-up type: In-patient    Tonna Corner 08/18/2014, 3:25 PM

## 2014-08-18 NOTE — Anesthesia Postprocedure Evaluation (Signed)
  Anesthesia Post-op Note  Patient: Evelyn Wade  Procedure(s) Performed: Procedure(s): CESAREAN SECTION (N/A)  Patient Location: PACU and Mother/Baby  Anesthesia Type:Epidural  Level of Consciousness: awake, alert  and oriented  Airway and Oxygen Therapy: Patient Spontanous Breathing  Post-op Pain: none  Post-op Assessment: Post-op Vital signs reviewed and Patient's Cardiovascular Status Stable LLE Motor Response: Purposeful movement LLE Sensation: Increased RLE Motor Response: Purposeful movement RLE Sensation: Increased      Post-op Vital Signs: Reviewed and stable  Last Vitals:  Filed Vitals:   08/18/14 0650  BP: 112/51  Pulse: 72  Temp: 36.6 C  Resp: 20    Complications: No apparent anesthesia complications

## 2014-08-18 NOTE — Addendum Note (Signed)
Addendum  created 08/18/14 4799 by Hewitt Blade, CRNA   Modules edited: Notes Section   Notes Section:  File: [id number]

## 2014-08-18 NOTE — Progress Notes (Signed)
Evelyn Wade [id number]  Subjective: Postpartum Day 1: Primary LTC/S due to [facility], FTP--Doing very well. Patient up to bathroom for foley removal at 7am, no spontaneous void yet, and  reports no syncope or dizziness. Feeding:  Pumping for NICU infant Contraceptive plan:  Undecided  Baby to NICU during night for hyperbilirubinemia, ABO incompatibility.  Objective: Temp:  [97.9 F (36.6 C)-98.9 F (37.2 C)] 97.9 F (36.6 C) (07/09 0650) Pulse Rate:  [65-110] 72 (07/09 0650) Resp:  [8-26] 20 (07/09 0650) BP: (103-144)/(51-121) 112/51 mmHg (07/09 0650) SpO2:  [96 %-100 %] 99 % (07/09 0650)   Filed Vitals:   08/17/14 2205 08/17/14 2305 08/18/14 0320 08/18/14 0650  BP: 124/61 109/59  112/51  Pulse: 69 70  72  Temp: 98.3 F (36.8 C) 98.3 F (36.8 C) 98.3 F (36.8 C) 97.9 F (36.6 C)  TempSrc: Oral Oral Oral Oral  Resp: 20 20 22 20   Height:      Weight:      SpO2: 100% 99% 100% 99%   Orthostatics stable   Recent Labs  08/16/14 2114 08/18/14 0550  HGB 10.7* 8.7*  HCT 31.9* 26.8*  WBC 11.8* 12.5*   CBG (last 3)   Recent Labs  08/17/14 1317 08/17/14 1809 08/18/14 0707  GLUCAP 89 80 99      Physical Exam:  General: alert Lochia: appropriate Uterine Fundus: firm Abdomen:  + bowel sounds, significant pannus Incision: Honeycomb dressing CDI--small amount old drainage marked. DVT Evaluation: No evidence of DVT seen on physical exam. Negative Homan's sign. SCDs in place JP drain:  90 cc overnight  Assessment/Plan: Status post cesarean delivery, day 1. Type 2 DM--no meds Chronic HTN--no meds Morbid obesity Anemia without hemodynamic instability Stable Continue current care. Encourage ambulation--patient plans to take shower after 4:30pm today per RN. Fe BID Will continue to observe foley drainage--may need to remove in office several days after d/c. Patient may also need to come to office for honeycomb removal and incision check after d/c, due to body  habitus.    Donnel Saxon MSN, CNM 08/18/2014, 11:32 AM

## 2014-08-18 NOTE — Lactation Note (Signed)
This note was copied from the chart of [place]. Lactation Consultation Note  Patient Name: Evelyn Wade ZLDJT'T Date: 08/18/2014 Reason for consult: Initial assessment   With this mom of a term baby, now 51 hours old and in NICU with hyperbili. Mom was set up with DEP and shown how to use premie setting, and hand express. Mom demonstrated hand exp with good technique. Mom was able to express a few drops fo clear colostrum, which she or dad will bring to the baby. NICU booklet reviewed with mom. Mom advised to pump every 2 hours during the day, and sleep  5 hours at night. Goal to be at least 8 times pumping a day. Mom knows to call for questions/concerns.    Maternal Data Formula Feeding for Exclusion: Yes (baby in NICU) Has patient been taught Hand Expression?: Yes Does the patient have breastfeeding experience prior to this delivery?: No  Feeding Feeding Type: Formula Nipple Type: Slow - flow (green) Length of feed: 15 min  LATCH Score/Interventions                      Lactation Tools Discussed/Used WIC Program: No Pump Review: Setup, frequency, and cleaning;Milk Storage;Other (comment) (hand expression, premie setting, reivew of NICU bookklet) Initiated by:: bedside RN Date initiated:: 08/18/14   Consult Status Consult Status: Follow-up Date: 08/19/14 Follow-up type: In-patient    Tonna Corner 08/18/2014, 10:47 AM

## 2014-08-19 LAB — CBC
HEMATOCRIT: 28.4 % — AB (ref 36.0–46.0)
HEMOGLOBIN: 9.2 g/dL — AB (ref 12.0–15.0)
MCH: 25.4 pg — AB (ref 26.0–34.0)
MCHC: 32.4 g/dL (ref 30.0–36.0)
MCV: 78.5 fL (ref 78.0–100.0)
Platelets: 226 10*3/uL (ref 150–400)
RBC: 3.62 MIL/uL — ABNORMAL LOW (ref 3.87–5.11)
RDW: 16.8 % — ABNORMAL HIGH (ref 11.5–15.5)
WBC: 10.8 10*3/uL — AB (ref 4.0–10.5)

## 2014-08-19 LAB — GLUCOSE, CAPILLARY
GLUCOSE-CAPILLARY: 84 mg/dL (ref 65–99)
Glucose-Capillary: 67 mg/dL (ref 65–99)
Glucose-Capillary: 85 mg/dL (ref 65–99)
Glucose-Capillary: 88 mg/dL (ref 65–99)
Glucose-Capillary: 96 mg/dL (ref 65–99)

## 2014-08-19 NOTE — Progress Notes (Addendum)
TOPAZ RAGLIN [id number]  Subjective: Postpartum Day 2: Primary LTC/S due to [facility], FTP Patient up ad lib, reports no syncope or dizziness. Up to shower and has ambulated to NICU.  More sore today. Feeding:  Pumping Contraceptive plan:  Undecided  Baby stable in NICU--parents hoping bili has peaked.  Objective: Temp:  [97.6 F (36.4 C)-98.5 F (36.9 C)] 98.5 F (36.9 C) (07/10 5456) Pulse Rate:  [66-75] 74 (07/10 0632) Resp:  [18-20] 18 (07/10 2563) BP: (110-115)/(47-65) 115/65 mmHg (07/10 0632) SpO2:  [96 %-100 %] 100 % (07/10 8937)   Filed Vitals:   08/18/14 0650 08/18/14 1130 08/18/14 1813 08/19/14 0632  BP: 112/51 115/47 110/59 115/65  Pulse: 72 66 75 74  Temp: 97.9 F (36.6 C) 97.6 F (36.4 C) 98 F (36.7 C) 98.5 F (36.9 C)  TempSrc: Oral Oral Oral Oral  Resp: 20 20 20 18   Height:      Weight:      SpO2: 99% 96%  100%     Recent Labs  08/16/14 2114 08/18/14 0550 08/19/14 0521  HGB 10.7* 8.7* 9.2*  HCT 31.9* 26.8* 28.4*  WBC 11.8* 12.5* 10.8*   CBG (last 3)   Recent Labs  08/18/14 1816 08/19/14 0044 08/19/14 0734  GLUCAP 95 85 84    Urine output 7a-7p 08/18/14 = 650. Up during last 12 hours "several times", with qs each void.   Physical Exam:  General: alert Lochia: appropriate Uterine Fundus: firm Abdomen:  + bowel sounds Incision: Honeycomb dressing replaced today due to small amount old drainage, CDI. DVT Evaluation: No evidence of DVT seen on physical exam. Negative Homan's sign. JP drain:   40 cc last 24 hours  Assessment/Plan: Status post cesarean delivery, day 2. Morbid obesity Chronic hypertension--no meds Type 2 DM--no meds Stable Continue current care. Plan for discharge tomorrow  Will evaluate JP tomorrow for removal or retaining for several more days. May need incision recheck in office in upcoming days due to body habitus.   Donnel Saxon MSN, CNM 08/19/2014, 9:06 AM   i saw and examined patient and agree with  above findings, assessment and plan. Patient c/o irritation on left outer vaginal area, left groin area. She denies any internal vaginal symptoms.  On exam no lesions visualized on vagina/groin area.  Sweat moisture noted. Advised expectant management, if continued or worsening symptoms will need reexamination.  Dr. Alesia Richards.

## 2014-08-19 NOTE — Progress Notes (Signed)
Hypoglycemic Event  CBG: 67  Treatment:   4 oz of apple juice  Symptoms:  None  Follow-up CBG: Time:1845 CBG Result  97  Possible Reasons for Event:  Pt ate only 15% of lunch.  Comments/MD notified: Follow hypoglycemic protocol    Alison Stalling  Remember to initiate Hypoglycemia Order Set & complete

## 2014-08-20 DIAGNOSIS — D649 Anemia, unspecified: Secondary | ICD-10-CM | POA: Diagnosis present

## 2014-08-20 LAB — GLUCOSE, CAPILLARY
GLUCOSE-CAPILLARY: 58 mg/dL — AB (ref 65–99)
GLUCOSE-CAPILLARY: 80 mg/dL (ref 65–99)
GLUCOSE-CAPILLARY: 90 mg/dL (ref 65–99)
Glucose-Capillary: 101 mg/dL — ABNORMAL HIGH (ref 65–99)
Glucose-Capillary: 61 mg/dL — ABNORMAL LOW (ref 65–99)

## 2014-08-20 MED ORDER — MEASLES, MUMPS & RUBELLA VAC ~~LOC~~ INJ: 0.5000 mL | INJECTION | Freq: Once | SUBCUTANEOUS | Status: DC

## 2014-08-20 MED ORDER — IBUPROFEN 600 MG PO TABS: 600.0000 mg | ORAL_TABLET | Freq: Four times a day (QID) | ORAL | Status: DC | PRN

## 2014-08-20 MED ORDER — OXYCODONE-ACETAMINOPHEN 5-325 MG PO TABS: 1.0000 | ORAL_TABLET | ORAL | Status: DC | PRN

## 2014-08-20 MED ORDER — MEASLES, MUMPS & RUBELLA VAC ~~LOC~~ INJ
0.5000 mL | INJECTION | Freq: Once | SUBCUTANEOUS | Status: AC
  Administered 2014-08-20: 0.5 mL via SUBCUTANEOUS
  Filled 2014-08-20: qty 0.5

## 2014-08-20 NOTE — Lactation Note (Signed)
This note was copied from the chart of [place]. Lactation Consultation Note  Patient Name: Evelyn Wade VZDGL'O Date: 08/20/2014 Reason for consult: Follow-up assessment;NICU baby NICU baby 32 hours old. Parents in NICU visiting with baby, mom holding baby while baby on bili lights. Mom states that baby was sleepy at breast during attempt. Discussed that elevated bilirubin probably the reason. Mom asking about a NS. Discussed with mom that she can be fitted with a NS. Enc mom to ask for [facility] assist at next BF attempt. Mom aware of OP/BFSG and [place] phone line assistance after D/C. Mom states that pumping going well, her milk supply is increasing and she does have a DEBP at home. Mom aware of pumping rooms in NICU and knows to take home her pumping kit. Mom requested a diaper bag, but none in CN. Security was notified to see if anymore available, and CN secretary Stanton Kidney will bring to patient's room if one is located. Patient's RN Deven notified about diaper bag for patient as well.   Maternal Data    Feeding Feeding Type: Formula Nipple Type: Slow - flow Length of feed: 20 min  LATCH Score/Interventions                      Lactation Tools Discussed/Used     Consult Status Consult Status: PRN    Inocente Salles 08/20/2014, 11:51 AM

## 2014-08-20 NOTE — Discharge Summary (Signed)
  Cesarean Section Delivery Discharge Summary  Evelyn Wade  DOB:    1970-09-18 MRN:    [id number] CSN:    [id number]  Date of admission:                  08/15/14  Date of discharge:                   08/20/14  Procedures this admission:  Primary LTCS for [facility], FTP, placement of JP drain  Date of Delivery: 08/17/14  Newborn Data:  Live born female  Birth Weight: 7 lb 10 oz (3459 g) APGAR: 9, 9  Baby remains in NICU at time of mother's d/c due to hyperbilirubinemia, improving Name: Evelyn Wade Circumcision Plan: Inpatient before d/c from NICU  History of Present Illness:  Evelyn Wade is a 44 y.o. female, G1P1001, who presents at [redacted]w[redacted]d weeks gestation. The patient has been followed at [facility] and Gynecology division of [facility]   Her pregnancy has been complicated by:  Patient Active Problem List   Diagnosis Date Noted  . Anemia 08/20/2014  . S/P primary low transverse C-section 08/17/2014  . Short stature 08/16/2014  . AMA (advanced maternal age) multigravida 35+ 08/15/2014  . Chronic hypertension 08/15/2014  . Rubella non-immune status, antepartum 08/15/2014  . Diabetes 08/14/2012  . Morbid obesity 08/14/2012    Hospital Course--Scheduled Cesarean:  Admitting Dx:  IUP at 40 3/7 weeks, Type 2 DM, morbid obesity, chronic hypertension, rubella non-immune, for induction Rationale for C/S: NRFHR, FTP Anesthesia:  Epidural Surgeon:  Dr. Mancel Bale Complications: Anemia without hemodynamic instability  Intrapartum Procedures: cesarean: low cervical, transverse Postpartum Procedures: Rubella Ig offered, placement of JP drain Complications-Operative and Postpartum: Hgb 8.7 on day 1, 9.2 on day 2.  Pre-delivery 10.7  Discharge Diagnoses: Term Pregnancy-delivered, primary LTCS for NRFHR and FTP, morbid obesity, anemia  Feeding:  Breast--pumping for breastmilk  Contraception:  Patient reports husband does not want use of  contraception, but patient considering options.  Hemoglobin Results:  CBC CBC Latest Ref Rng 08/19/2014 08/18/2014 08/16/2014  WBC 4.0 - 10.5 K/uL 10.8(H) 12.5(H) 11.8(H)  Hemoglobin 12.0 - 15.0 g/dL 9.2(L) 8.7(L) 10.7(L)  Hematocrit 36.0 - 46.0 % 28.4(L) 26.8(L) 31.9(L)  Platelets 150 - 400 K/uL 226 207 237      Discharge Physical Exam:   General: alert Lochia: appropriate Uterine Fundus: firm Abdomen:  + bowel sounds, NT Incision:  Honeycomb dressing CDI DVT Evaluation: No evidence of DVT seen on physical exam. Negative Homan's sign. JP drain:  65 cc serosanguinous drainage in last 24 hours. (90 cc first 24 hours, 40 cc 2nd day)  Discharge Information:  Activity:           pelvic rest Diet:                routine Medications: Ibuprofen, Iron and Percocet Condition:      stable Instructions:  Routine pp, care of JP drain.    Discharge to: home  Follow-up Information    Follow up with [facility] On 08/24/2014.   Specialty:  Obstetrics and Gynecology   Why:  For wound re-check and removal of JP drain.  Office will call to schedule this appt.  Call for any questions or concerns.   Contact information:   [address] [place] [address] [phone]       Rasha Ibe, [place] 08/20/2014 7:58 AM

## 2014-08-20 NOTE — Discharge Instructions (Signed)
Postpartum Care After Cesarean Delivery After you deliver your newborn (postpartum period), the usual stay in the hospital is 24-72 hours. If there were problems with your labor or delivery, or if you have other medical problems, you might be in the hospital longer.  While you are in the hospital, you will receive help and instructions on how to care for yourself and your newborn during the postpartum period.  While you are in the hospital:  It is normal for you to have pain or discomfort from the incision in your abdomen. Be sure to tell your nurses when you are having pain, where the pain is located, and what makes the pain worse.  If you are breastfeeding, you may feel uncomfortable contractions of your uterus for a couple of weeks. This is normal. The contractions help your uterus get back to normal size.  It is normal to have some bleeding after delivery.  For the first 1-3 days after delivery, the flow is red and the amount may be similar to a period.  It is common for the flow to start and stop.  In the first few days, you may pass some small clots. Let your nurses know if you begin to pass large clots or your flow increases.  Do not  flush blood clots down the toilet before having the nurse look at them.  During the next 3-10 days after delivery, your flow should become more watery and pink or brown-tinged in color.  Ten to fourteen days after delivery, your flow should be a small amount of yellowish-white discharge.  The amount of your flow will decrease over the first few weeks after delivery. Your flow may stop in 6-8 weeks. Most women have had their flow stop by 12 weeks after delivery.  You should change your sanitary pads frequently.  Wash your hands thoroughly with soap and water for at least 20 seconds after changing pads, using the toilet, or before holding or feeding your newborn.  Your intravenous (IV) tubing will be removed when you are drinking enough fluids.  The  urine drainage tube (urinary catheter) that was inserted before delivery may be removed within 6-8 hours after delivery or when feeling returns to your legs. You should feel like you need to empty your bladder within the first 6-8 hours after the catheter has been removed.  In case you become weak, lightheaded, or faint, call your nurse before you get out of bed for the first time and before you take a shower for the first time.  Within the first few days after delivery, your breasts may begin to feel tender and full. This is called engorgement. Breast tenderness usually goes away within 48-72 hours after engorgement occurs. You may also notice milk leaking from your breasts. If you are not breastfeeding, do not stimulate your breasts. Breast stimulation can make your breasts produce more milk.  Spending as much time as possible with your newborn is very important. During this time, you and your newborn can feel close and get to know each other. Having your newborn stay in your room (rooming in) will help to strengthen the bond with your newborn. It will give you time to get to know your newborn and become comfortable caring for your newborn.  Your hormones change after delivery. Sometimes the hormone changes can temporarily cause you to feel sad or tearful. These feelings should not last more than a few days. If these feelings last longer than that, you should talk to your  caregiver.  If desired, talk to your caregiver about methods of family planning or contraception.  Talk to your caregiver about immunizations. Your caregiver may want you to have the following immunizations before leaving the hospital:  Tetanus, diphtheria, and pertussis (Tdap) or tetanus and diphtheria (Td) immunization. It is very important that you and your family (including grandparents) or others caring for your newborn are up-to-date with the Tdap or Td immunizations. The Tdap or Td immunization can help protect your newborn  from getting ill.  Rubella immunization.  Varicella (chickenpox) immunization.  Influenza immunization. You should receive this annual immunization if you did not receive the immunization during your pregnancy. Document Released: 10/21/2011 Document Reviewed: 10/21/2011 [facility] Patient Information 2015 [place]. This information is not intended to replace advice given to you by your health care provider. Make sure you discuss any questions you have with your health care provider.  Bulb Drain Home Care A bulb drain consists of a thin rubber tube and a soft, round bulb that creates a gentle suction. The rubber tube is placed in the area where you had surgery. A bulb is attached to the end of the tube that is outside the body. The bulb drain removes excess fluid that normally builds up in a surgical wound after surgery. The color and amount of fluid will vary. Immediately after surgery, the fluid is bright red and is a little thicker than water. It may gradually change to a yellow or pink color and become more thin and water-like. When the amount decreases to about 1 or 2 tbsp in 24 hours, your health care provider will usually remove it. DAILY CARE  Keep the bulb flat (compressed) at all times, except while emptying it. The flatness creates suction. You can flatten the bulb by squeezing it firmly in the middle and then closing the cap.  Keep sites where the tube enters the skin dry and covered with a bandage (dressing).  Secure the tube 1-2 in (2.5-5.1 cm) below the insertion sites to keep it from pulling on your stitches. The tube is stitched in place and will not slip out.  Secure the bulb as directed by your health care provider.  For the first 3 days after surgery, there usually is more fluid in the bulb. Empty the bulb whenever it becomes half full because the bulb does not create enough suction if it is too full. The bulb could also overflow. Write down how much fluid you remove  each time you empty your drain. Add up the amount removed in 24 hours.  Empty the bulb at the same time every day once the amount of fluid decreases and you only need to empty it once a day. Write down the amounts and the 24-hour totals to give to your health care provider. This helps your health care provider know when the tubes can be removed. EMPTYING THE BULB DRAIN Before emptying the bulb, get a measuring cup, a piece of paper and a pen, and wash your hands.  Gently run your fingers down the tube (stripping) to empty any drainage from the tubing into the bulb. This may need to be done several times a day to clear the tubing of clots and tissue.  Open the bulb cap to release suction, which causes it to inflate. Do not touch the inside of the cap.  Gently run your fingers down the tube (stripping) to empty any drainage from the tubing into the bulb.  Hold the cap out of the  way, and pour fluid into the measuring cup.   Squeeze the bulb to provide suction.  Replace the cap.   Check the tape that holds the tube to your skin. If it is becoming loose, you can remove the loose piece of tape and apply a new one. Then, pin the bulb to your shirt.   Write down the amount of fluid you emptied out. Write down the date and each time you emptied your bulb drain. (If there are 2 bulbs, note the amount of drainage from each bulb and keep the totals separate. Your health care provider will want to know the total amounts for each drain and which tube is draining more.)   Flush the fluid down the toilet and wash your hands.   Call your health care provider once you have less than 2 tbsp of fluid collecting in the bulb drain every 24 hours. If there is drainage around the tube site, change dressings and keep the area dry. Cleanse around tube with sterile saline and place dry gauze around site. This gauze should be changed when it is soiled. If it stays clean and unsoiled, it should still be changed  daily.  SEEK MEDICAL CARE IF:  Your drainage has a bad smell or is cloudy.   You have a fever.   Your drainage is increasing instead of decreasing.   Your tube fell out.   You have redness or swelling around the tube site.   You have drainage from a surgical wound.   Your bulb drain will not stay flat after you empty it.  MAKE SURE YOU:   Understand these instructions.  Will watch your condition.  Will get help right away if you are not doing well or get worse. Document Released: 01/24/2000 Document Revised: 06/12/2013 Document Reviewed: 07/01/2011 [facility] Patient Information 2015 [place], [place]. This information is not intended to replace advice given to you by your health care provider. Make sure you discuss any questions you have with your health care provider.

## 2014-08-21 ENCOUNTER — Ambulatory Visit: Payer: Self-pay

## 2014-08-21 ENCOUNTER — Encounter (HOSPITAL_COMMUNITY): Payer: Self-pay | Admitting: Obstetrics and Gynecology

## 2014-08-21 NOTE — Lactation Note (Signed)
This note was copied from the chart of [name]. Lactation Consultation Note  Patient Name: Evelyn Wade [id number] Date: 08/21/2014 Reason for consult: Follow-up assessment;NICU baby NICU baby 78 days old. Called to NICU to discuss mom's right breast feeling tight and full. Discussed engorgement/prevention methods and gave mom a hand pump with instructions for additional help with keeping milk flowing. Discussed importance of hand expression, and mom states that she has been continuing to hand express and keep milk flowing. Mom aware of OP/BFSG and [place] phone line assistance after D/C.   Maternal Data    Feeding    LATCH Score/Interventions                      Lactation Tools Discussed/Used     Consult Status Consult Status: PRN    Inocente Salles 08/21/2014, 7:00 PM

## 2014-09-20 ENCOUNTER — Ambulatory Visit (HOSPITAL_COMMUNITY)
Admission: RE | Admit: 2014-09-20 | Discharge: 2014-09-20 | Disposition: A | Discharge status: Home / Self Care | Payer: 59 | Source: Ambulatory Visit | Attending: Obstetrics and Gynecology | Admitting: Obstetrics and Gynecology

## 2014-09-20 NOTE — Lactation Note (Signed)
Lactation Consult  Mother's reason for visit:   Visit Type:  Feeding assessment  Appointment Notes:  Problem with latching, frenulum revised, confirmed  Consult:  Initial Lactation Consultant:  Myer Haff  ________________________________________________________________________  50 Name: Evelyn Wade Date of Birth: 08/17/2012 Pediatrician: [facility] , Dr. Suzan Slick  Gender: female Gestational Age: [redacted]w[redacted]d (At Birth) Birth Weight: 7 lb 10 oz (3459 g) Weight at Discharge: Weight: 7 lb 9.7 oz (3450 g)Date of Discharge: 08/21/2014 [facility] Weights   08/19/14 2035 08/20/14 1410 08/21/14 1300  Weight: 7 lb 8.6 oz (3420 g) 7 lb 8.6 oz (3420 g) 7 lb 9.7 oz (3450 g)   Last weight taken from location outside of [facility]: 8-10 ( 2 weeks ago)- Dr. Gabriel Carina   Weight today : 4752 g 10-7.6 oz      ______________________________________________________________________  Mother's Name: Evelyn Wade Type of delivery:   Breastfeeding Experience: per mom -none really just the assistance given in the hospital  Maternal Medical Conditions:  Gestational diabetes mellitis and Gastric bypass Maternal Medications:  B- Vitamins , PNV  ________________________________________________________________________  Breastfeeding History (Post Discharge)  Frequency of breastfeeding:  Only pumping not latching  Duration of feeding:    Supplementing: with EBM and formula and not latching   Infant Intake and Output Assessment  Voids:  7-8 24 hrs.  Color:  Clear yellow Stools:  1-2  24 hours - Color : 1-2 greenish - yellow   ________________________________________________________________________  Maternal Breast Assessment  Breast:  Soft Nipple:  Erect Pain level:  0 Pain interventions:  Expressed breast milk  _______________________________________________________________________ Feeding Assessment/Evaluation  Initial feeding  assessment:  Infant's oral assessment:  Variance  Positioning:  Football Left breast  LATCH documentation:  Latch:  2 = Grasps breast easily, tongue down, lips flanged, rhythmical sucking.  Audible swallowing:  2 = Spontaneous and intermittent  Type of nipple:  2 = Everted at rest and after stimulation  Comfort (Breast/Nipple):  2 = Soft / non-tender  Hold (Positioning):  1 = Assistance needed to correctly position infant at breast and maintain latch  LATCH score:  *8  Attached assessment:  Deep  Lips flanged:  Yes.    Lips untucked:  Yes.    Suck assessment:  Nutritive and Nonnutritive  Tools:  None  Instructed on use and cleaning of tool:  No. reweight after wet diaper then fed  Pre-feed weight:  4794g , 10.9.1 oz  Post-feed weight:  4798 g , 10.9.2 oz  Amount transferred:  4 ml  Amount supplemented:  60 ml ( prior to the baby latching had to feed majority of feeding form a bottle - to fussy to latch )  Amount after latching - 40 ml     Total amount pumped post feed:  Mom did not pump   Total amount transferred:  4 ml  Total supplement given:  100 ml   Lactation Impression : during consult - mom mentioned she really didn't care whether baby latched at the breast  She was going to remain calm over the feedings  And continue to pump.  Well the baby was latched and only transferred 4 ml . ( [place] felt moms milk supply was decreased ) and mom has been feeding the  Baby enough at each feeding for baby to grow and is aware the volumes are gradually increased with the baby's feeding needs. Mom has been using a DEBP pumping about every 3 hours with 30 - 30  EBM Yield , sometimes  60 ml   Lactation Plan of Care: Praised mom for her efforts breast feeding  Mom - plenty of fluids , especially water , nutritious snacks and meals , PNV , and Vitamin B's due to gastric Bypass Focus on what you can give your baby , not what you can't  Don't try to [place] when he is really hungry  May  need to supplement with 1 1/2 - 2 oz before feeding and then latch  For age 78 -4 oz per feeding

## 2015-12-20 ENCOUNTER — Other Ambulatory Visit: Payer: Self-pay | Admitting: Internal Medicine

## 2015-12-20 DIAGNOSIS — Z1231 Encounter for screening mammogram for malignant neoplasm of breast: Secondary | ICD-10-CM

## 2015-12-31 ENCOUNTER — Other Ambulatory Visit (HOSPITAL_COMMUNITY)
Admission: RE | Admit: 2015-12-31 | Discharge: 2015-12-31 | Disposition: A | Discharge status: Home / Self Care | Payer: BC Managed Care – PPO | Source: Ambulatory Visit | Attending: Obstetrics and Gynecology | Admitting: Obstetrics and Gynecology

## 2015-12-31 ENCOUNTER — Other Ambulatory Visit: Payer: Self-pay | Admitting: Obstetrics and Gynecology

## 2015-12-31 DIAGNOSIS — Z1151 Encounter for screening for human papillomavirus (HPV): Secondary | ICD-10-CM | POA: Insufficient documentation

## 2015-12-31 DIAGNOSIS — Z01419 Encounter for gynecological examination (general) (routine) without abnormal findings: Secondary | ICD-10-CM | POA: Diagnosis present

## 2016-01-01 LAB — CYTOLOGY - PAP
DIAGNOSIS: NEGATIVE
HPV: NOT DETECTED

## 2016-01-14 ENCOUNTER — Ambulatory Visit
Admission: RE | Admit: 2016-01-14 | Discharge: 2016-01-14 | Disposition: A | Discharge status: Home / Self Care | Payer: BC Managed Care – PPO | Source: Ambulatory Visit | Attending: Internal Medicine | Admitting: Internal Medicine

## 2016-01-14 DIAGNOSIS — Z1231 Encounter for screening mammogram for malignant neoplasm of breast: Secondary | ICD-10-CM

## 2016-01-15 ENCOUNTER — Other Ambulatory Visit: Payer: Self-pay | Admitting: Obstetrics and Gynecology

## 2017-01-12 ENCOUNTER — Other Ambulatory Visit: Payer: Self-pay | Admitting: Obstetrics and Gynecology

## 2017-01-12 DIAGNOSIS — Z1231 Encounter for screening mammogram for malignant neoplasm of breast: Secondary | ICD-10-CM

## 2017-02-11 ENCOUNTER — Ambulatory Visit: Payer: BC Managed Care – PPO

## 2017-03-04 ENCOUNTER — Ambulatory Visit: Payer: BC Managed Care – PPO

## 2017-11-08 ENCOUNTER — Other Ambulatory Visit: Payer: Self-pay | Admitting: Internal Medicine

## 2017-11-08 DIAGNOSIS — E1169 Type 2 diabetes mellitus with other specified complication: Secondary | ICD-10-CM

## 2017-11-08 DIAGNOSIS — Z6841 Body Mass Index (BMI) 40.0 and over, adult: Secondary | ICD-10-CM

## 2017-11-08 DIAGNOSIS — Z79899 Other long term (current) drug therapy: Secondary | ICD-10-CM

## 2017-11-08 DIAGNOSIS — I10 Essential (primary) hypertension: Secondary | ICD-10-CM

## 2017-11-09 LAB — COMPREHENSIVE METABOLIC PANEL
ALT: 10 IU/L (ref 0–32)
AST: 10 IU/L (ref 0–40)
Albumin/Globulin Ratio: 1.2 (ref 1.2–2.2)
Albumin: 3.8 g/dL (ref 3.5–5.5)
Alkaline Phosphatase: 75 IU/L (ref 39–117)
BUN/Creatinine Ratio: 15 (ref 9–23)
BUN: 11 mg/dL (ref 6–24)
Bilirubin Total: 0.4 mg/dL (ref 0.0–1.2)
CALCIUM: 9.5 mg/dL (ref 8.7–10.2)
CO2: 22 mmol/L (ref 20–29)
Chloride: 103 mmol/L (ref 96–106)
Creatinine, Ser: 0.72 mg/dL (ref 0.57–1.00)
GFR calc Af Amer: 118 mL/min/{1.73_m2} (ref >59)
GFR, EST NON AFRICAN AMERICAN: 102 mL/min/{1.73_m2} (ref >59)
Globulin, Total: 3.3 g/dL (ref 1.5–4.5)
Glucose: 100 mg/dL — ABNORMAL HIGH (ref 65–99)
Potassium: 4.3 mmol/L (ref 3.5–5.2)
Sodium: 138 mmol/L (ref 134–144)
Total Protein: 7.1 g/dL (ref 6.0–8.5)

## 2017-11-09 LAB — HGB A1C W/O EAG: HEMOGLOBIN A1C: 5.9 % — AB (ref 4.8–5.6)

## 2017-11-16 ENCOUNTER — Other Ambulatory Visit: Payer: Self-pay

## 2017-11-16 MED ORDER — SEMAGLUTIDE(0.25 OR 0.5MG/DOS) 2 MG/1.5ML ~~LOC~~ SOPN: 0.5000 mg | PEN_INJECTOR | SUBCUTANEOUS | 2 refills | Status: DC

## 2017-11-16 MED ORDER — CHLORPHEN-PE-ACETAMINOPHEN 4-10-325 MG PO TABS: 1.0000 | ORAL_TABLET | Freq: Two times a day (BID) | ORAL | 0 refills | Status: DC

## 2017-11-29 ENCOUNTER — Encounter: Payer: Self-pay | Admitting: Internal Medicine

## 2017-11-29 ENCOUNTER — Other Ambulatory Visit: Payer: Self-pay

## 2017-11-29 ENCOUNTER — Ambulatory Visit: Payer: BC Managed Care – PPO | Admitting: Internal Medicine

## 2017-11-29 VITALS — BP 124/76 | HR 81 | Temp 98.0°F | Ht 61.0 in | Wt 364.4 lb

## 2017-11-29 DIAGNOSIS — J0100 Acute maxillary sinusitis, unspecified: Secondary | ICD-10-CM

## 2017-11-29 DIAGNOSIS — J209 Acute bronchitis, unspecified: Secondary | ICD-10-CM

## 2017-11-29 MED ORDER — AZITHROMYCIN 250 MG PO TABS: ORAL_TABLET | ORAL | 0 refills | Status: AC

## 2017-11-29 MED ORDER — CEFTRIAXONE SODIUM 500 MG IJ SOLR
500.0000 mg | Freq: Once | INTRAMUSCULAR | Status: AC
  Administered 2017-11-29: 500 mg via INTRAMUSCULAR

## 2017-11-29 MED ORDER — HYDROCODONE-HOMATROPINE 5-1.5 MG/5ML PO SYRP: 5.0000 mL | ORAL_SOLUTION | Freq: Four times a day (QID) | ORAL | 0 refills | Status: DC | PRN

## 2017-11-29 MED ORDER — ALBUTEROL SULFATE (2.5 MG/3ML) 0.083% IN NEBU
2.5000 mg | INHALATION_SOLUTION | Freq: Once | RESPIRATORY_TRACT | Status: AC
  Administered 2017-11-29: 2.5 mg via RESPIRATORY_TRACT

## 2017-11-29 MED ORDER — SEMAGLUTIDE(0.25 OR 0.5MG/DOS) 2 MG/1.5ML ~~LOC~~ SOPN: 0.5000 mg | PEN_INJECTOR | SUBCUTANEOUS | 2 refills | Status: DC

## 2017-11-29 NOTE — Progress Notes (Signed)
Subjective:     Patient ID: Evelyn Wade , female    DOB: 09-25-1970 , 47 y.o.   MRN: [id number]   URI   This is a recurrent problem. The current episode started 1 to 4 weeks ago. The problem has been gradually worsening. There has been no fever. Associated symptoms include congestion, coughing, rhinorrhea and sinus pain. She has tried NSAIDs, increased fluids, decongestant and acetaminophen for the symptoms. The treatment provided no relief.     Past Medical History:  Diagnosis Date  . Diabetes mellitus without complication ([place])   . Hypertension   . Pneumonia    hx  . Sleep apnea    cpap 4-5 yrs      Current Outpatient Medications:  .  azithromycin (ZITHROMAX Z-PAK) 250 MG tablet, Take 2 tablets (500 mg) on  Day 1,  followed by 1 tablet (250 mg) once daily on Days 2 through 5., Disp: 6 each, Rfl: 0 .  Chlorphen-PE-Acetaminophen (NOREL AD) 4-10-325 MG TABS, Take 1 tablet by mouth 2 (two) times daily., Disp: 20 tablet, Rfl: 0 .  HYDROcodone-homatropine (HYDROMET) 5-1.5 MG/5ML syrup, Take 5 mLs by mouth every 6 (six) hours as needed for cough., Disp: 120 mL, Rfl: 0 .  ibuprofen (ADVIL,MOTRIN) 600 MG tablet, Take 1 tablet (600 mg total) by mouth every 6 (six) hours as needed., Disp: 30 tablet, Rfl: 2 .  Iron Carbonyl-Vitamin C-FOS (CHEWABLE IRON) 30-10-25 MG CHEW, Chew by mouth., Disp: , Rfl:  .  Semaglutide,0.25 or 0.5MG /DOS, (OZEMPIC, 0.25 OR 0.5 MG/DOSE,) 2 MG/1.5ML SOPN, Inject 0.5 mg into the skin once a week., Disp: 1 pen, Rfl: 2   No Known Allergies   Review of Systems  Constitutional: Positive for appetite change, chills and fatigue.  HENT: Positive for congestion, postnasal drip, rhinorrhea, sinus pressure and sinus pain.   Respiratory: Positive for cough.   Cardiovascular: Negative.   Gastrointestinal: Negative.   Psychiatric/Behavioral: Negative.      Today's Vitals   11/29/17 1544  BP: 124/76  Pulse: 81  Temp: 98 F (36.7 C)  TempSrc: Oral  SpO2: 98%   Weight: (!) 364 lb 6.4 oz (165.3 kg)  Height: 5\' 1"  (1.549 m)   Body mass index is 68.85 kg/m.   Objective:  Physical Exam  Constitutional: She is oriented to person, place, and time. She appears well-developed and well-nourished.  LOOKS ILL  HENT:  Head: Normocephalic and atraumatic.  Right Ear: External ear normal.  Left Ear: External ear normal.  Nose: Right sinus exhibits maxillary sinus tenderness. Left sinus exhibits maxillary sinus tenderness.  Mouth/Throat: Posterior oropharyngeal erythema present.  ERYTHEMATOUS PHARYNX  Cardiovascular: Normal rate and regular rhythm.  Pulmonary/Chest: She has decreased breath sounds in the right middle field, the right lower field, the left middle field and the left lower field. She has rhonchi in the right middle field and the left middle field.  Neurological: She is alert and oriented to person, place, and time.  Psychiatric: She has a normal mood and affect.        Assessment And Plan:     Acute bronchitis, unspecified organism - SHE WAS GIVEN ALBUTEROL NEBULIZER WITH IMPROVEMENT IN HER BREATH SOUNDS. SHE WAS ALSO GIVEN RX HYDROMET SYRUP TO USE PRN.  - Plan: albuterol (PROVENTIL) (2.5 MG/3ML) 0.083% nebulizer solution 2.5 mg  Acute non-recurrent maxillary sinusitis - SHE WAS GIVEN ROCEPHIN, 500MG  IM X1 AND RX ZPAK. SHE IS ENCOURAGED TO COMPLETE FULL COURSE ABX. SHE WAS ALSO GIVEN WORK NOTE TO RTW  10/23.  - Plan: cefTRIAXone (ROCEPHIN) injection 500 mg     Maximino Greenland, MD

## 2017-12-01 ENCOUNTER — Ambulatory Visit: Payer: BC Managed Care – PPO | Admitting: Internal Medicine

## 2018-01-07 ENCOUNTER — Other Ambulatory Visit: Payer: Self-pay | Admitting: Internal Medicine

## 2018-01-11 NOTE — Telephone Encounter (Signed)
norel refill

## 2018-01-12 ENCOUNTER — Other Ambulatory Visit: Payer: Self-pay | Admitting: Internal Medicine

## 2018-02-07 ENCOUNTER — Emergency Department (HOSPITAL_COMMUNITY): Payer: BC Managed Care – PPO

## 2018-02-07 ENCOUNTER — Emergency Department (HOSPITAL_COMMUNITY)
Admission: EM | Admit: 2018-02-07 | Discharge: 2018-02-07 | Disposition: A | Discharge status: Home / Self Care | Payer: BC Managed Care – PPO | Attending: Emergency Medicine | Admitting: Emergency Medicine

## 2018-02-07 ENCOUNTER — Other Ambulatory Visit: Payer: Self-pay

## 2018-02-07 ENCOUNTER — Encounter (HOSPITAL_COMMUNITY): Payer: Self-pay | Admitting: Emergency Medicine

## 2018-02-07 DIAGNOSIS — R51 Headache: Secondary | ICD-10-CM | POA: Diagnosis present

## 2018-02-07 DIAGNOSIS — E119 Type 2 diabetes mellitus without complications: Secondary | ICD-10-CM | POA: Insufficient documentation

## 2018-02-07 DIAGNOSIS — M542 Cervicalgia: Secondary | ICD-10-CM | POA: Diagnosis not present

## 2018-02-07 DIAGNOSIS — S060X1A Concussion with loss of consciousness of 30 minutes or less, initial encounter: Secondary | ICD-10-CM | POA: Insufficient documentation

## 2018-02-07 DIAGNOSIS — Y939 Activity, unspecified: Secondary | ICD-10-CM | POA: Insufficient documentation

## 2018-02-07 DIAGNOSIS — Z79899 Other long term (current) drug therapy: Secondary | ICD-10-CM | POA: Insufficient documentation

## 2018-02-07 DIAGNOSIS — M25561 Pain in right knee: Secondary | ICD-10-CM

## 2018-02-07 DIAGNOSIS — I10 Essential (primary) hypertension: Secondary | ICD-10-CM | POA: Diagnosis not present

## 2018-02-07 DIAGNOSIS — Y929 Unspecified place or not applicable: Secondary | ICD-10-CM | POA: Insufficient documentation

## 2018-02-07 DIAGNOSIS — Y999 Unspecified external cause status: Secondary | ICD-10-CM | POA: Diagnosis not present

## 2018-02-07 DIAGNOSIS — H538 Other visual disturbances: Secondary | ICD-10-CM | POA: Diagnosis not present

## 2018-02-07 MED ORDER — NAPROXEN 500 MG PO TABS: 500.0000 mg | ORAL_TABLET | Freq: Two times a day (BID) | ORAL | 0 refills | Status: DC

## 2018-02-07 MED ORDER — METHOCARBAMOL 500 MG PO TABS: 500.0000 mg | ORAL_TABLET | Freq: Every evening | ORAL | 0 refills | Status: DC | PRN

## 2018-02-07 MED ORDER — HYDROCODONE-ACETAMINOPHEN 5-325 MG PO TABS
1.0000 | ORAL_TABLET | Freq: Once | ORAL | Status: AC
  Administered 2018-02-07: 1 via ORAL
  Filled 2018-02-07: qty 1
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
## 2018-02-11 ENCOUNTER — Encounter: Payer: Self-pay | Admitting: Internal Medicine

## 2018-02-11 ENCOUNTER — Ambulatory Visit: Payer: BC Managed Care – PPO | Admitting: Internal Medicine

## 2018-02-11 DIAGNOSIS — Z712 Person consulting for explanation of examination or test findings: Secondary | ICD-10-CM

## 2018-02-11 DIAGNOSIS — Z09 Encounter for follow-up examination after completed treatment for conditions other than malignant neoplasm: Secondary | ICD-10-CM | POA: Diagnosis not present

## 2018-02-11 DIAGNOSIS — M542 Cervicalgia: Secondary | ICD-10-CM | POA: Diagnosis not present

## 2018-02-11 DIAGNOSIS — E1165 Type 2 diabetes mellitus with hyperglycemia: Secondary | ICD-10-CM

## 2018-02-11 DIAGNOSIS — G44311 Acute post-traumatic headache, intractable: Secondary | ICD-10-CM

## 2018-02-11 DIAGNOSIS — Z6841 Body Mass Index (BMI) 40.0 and over, adult: Secondary | ICD-10-CM

## 2018-02-11 NOTE — Patient Instructions (Signed)
Muscle Strain A muscle strain is an injury that happens when a muscle is stretched longer than normal. This can happen during a fall, sports, or lifting. This can tear some muscle fibers. Usually, recovery from muscle strain takes 1-2 weeks. Complete healing normally takes 5-6 weeks. This condition is first treated with PRICE therapy. This involves:  Protecting your muscle from being injured again.  Resting your injured muscle.  Icing your injured muscle.  Applying pressure (compression) to your injured muscle. This may be done with a splint or elastic bandage.  Raising (elevating) your injured muscle. Your doctor may also recommend medicine for pain. Follow these instructions at home: If you have a splint:  Wear the splint as told by your doctor. Take it off only as told by your doctor.  Loosen the splint if your fingers or toes tingle, get numb, or turn cold and blue.  Keep the splint clean.  If the splint is not waterproof: ? Do not let it get wet. ? Cover it with a watertight covering when you take a bath or a shower. Managing pain, stiffness, and swelling   If directed, put ice on your injured area. ? If you have a removable splint, take it off as told by your doctor. ? Put ice in a plastic bag. ? Place a towel between your skin and the bag. ? Leave the ice on for 20 minutes, 2-3 times a day.  Move your fingers or toes often. This helps to avoid stiffness and lessen swelling.  Raise your injured area above the level of your heart while you are sitting or lying down.  Wear an elastic bandage as told by your doctor. Make sure it is not too tight. General instructions  Take over-the-counter and prescription medicines only as told by your doctor.  Limit your activity. Rest your injured muscle as told by your doctor. Your doctor may say that gentle movements are okay.  If physical therapy was prescribed, do exercises as told by your doctor.  Do not put pressure on any  part of the splint until it is fully hardened. This may take many hours.  Do not use any products that contain nicotine or tobacco, such as cigarettes and e-cigarettes. These can delay bone healing. If you need help quitting, ask your doctor.  Warm up before you exercise. This helps to prevent more muscle strains.  Ask your doctor when it is safe to drive if you have a splint.  Keep all follow-up visits as told by your doctor. This is important. Contact a doctor if:  You have more pain or swelling in your injured area. Get help right away if:  You have any of these problems in your injured area: ? You have numbness. ? You have tingling. ? You lose a lot of strength. Summary  A muscle strain is an injury that happens when a muscle is stretched longer than normal.  This condition is first treated with PRICE therapy. This includes protecting, resting, icing, adding pressure, and raising your injury.  Limit your activity. Rest your injured muscle as told by your doctor. Your doctor may say that gentle movements are okay.  Warm up before you exercise. This helps to prevent more muscle strains. This information is not intended to replace advice given to you by your health care provider. Make sure you discuss any questions you have with your health care provider. Document Released: 11/05/2007 Document Revised: 03/04/2016 Document Reviewed: 03/04/2016 [employer] Interactive Patient Education  2019 [employer]  
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
## 2018-03-07 ENCOUNTER — Encounter: Payer: Self-pay | Admitting: Internal Medicine

## 2018-03-07 ENCOUNTER — Ambulatory Visit: Payer: BC Managed Care – PPO | Admitting: Internal Medicine

## 2018-03-07 VITALS — BP 132/74 | HR 73 | Temp 97.6°F | Ht 60.0 in | Wt 361.8 lb

## 2018-03-07 DIAGNOSIS — Z6841 Body Mass Index (BMI) 40.0 and over, adult: Secondary | ICD-10-CM

## 2018-03-07 DIAGNOSIS — E669 Obesity, unspecified: Secondary | ICD-10-CM

## 2018-03-07 DIAGNOSIS — E1169 Type 2 diabetes mellitus with other specified complication: Secondary | ICD-10-CM | POA: Diagnosis not present

## 2018-03-07 DIAGNOSIS — M542 Cervicalgia: Secondary | ICD-10-CM

## 2018-03-07 DIAGNOSIS — E1159 Type 2 diabetes mellitus with other circulatory complications: Secondary | ICD-10-CM

## 2018-03-07 DIAGNOSIS — I10 Essential (primary) hypertension: Secondary | ICD-10-CM | POA: Diagnosis not present

## 2018-03-07 NOTE — Patient Instructions (Signed)

## 2018-03-07 NOTE — Progress Notes (Signed)
Subjective:     Patient ID: Evelyn Wade , female    DOB: 1970-09-02 , 48 y.o.   MRN: [id number]   Chief Complaint  Patient presents with  . Diabetes  . Hypertension    HPI  Diabetes  She presents for her follow-up diabetic visit. She has type 2 diabetes mellitus. Her disease course has been stable. There are no hypoglycemic associated symptoms. Pertinent negatives for diabetes include no blurred vision and no chest pain. There are no hypoglycemic complications. Symptoms are stable. Risk factors for coronary artery disease include diabetes mellitus, hypertension, obesity and sedentary lifestyle. Her weight is decreasing steadily. She is following a diabetic diet. Her breakfast blood glucose is taken between 7-8 am. Her breakfast blood glucose range is generally 90-110 mg/dl.  Hypertension  This is a chronic problem. The current episode started more than 1 year ago. The problem is controlled. Associated symptoms include neck pain. Pertinent negatives include no blurred vision or chest pain.  She reports compliance with meds.    Past Medical History:  Diagnosis Date  . Diabetes mellitus without complication ([place])   . Hypertension   . Pneumonia    hx  . Sleep apnea    cpap 4-5 yrs     Family History  Problem Relation Age of Onset  . Ovarian cancer Mother   . Heart attack Father   . Diabetes Father   . Hypertension Brother      Current Outpatient Medications:  .  HYDROcodone-homatropine (HYDROMET) 5-1.5 MG/5ML syrup, Take 5 mLs by mouth every 6 (six) hours as needed for cough., Disp: 120 mL, Rfl: 0 .  ibuprofen (ADVIL,MOTRIN) 600 MG tablet, Take 1 tablet (600 mg total) by mouth every 6 (six) hours as needed., Disp: 30 tablet, Rfl: 2 .  methocarbamol (ROBAXIN) 500 MG tablet, Take 1 tablet (500 mg total) by mouth at bedtime as needed for muscle spasms., Disp: 20 tablet, Rfl: 0 .  NIFEdipine (PROCARDIA-XL/NIFEDICAL-XL) 30 MG 24 hr tablet, TAKE 1 TABLET BY MOUTH EVERY DAY  (Patient taking differently: Take 30 mg by mouth daily. ), Disp: 90 tablet, Rfl: 1 .  NOREL AD 4-10-325 MG TABS, TAKE 1 TABLET BY MOUTH TWICE A DAY, Disp: 20 tablet, Rfl: 0 .  Semaglutide,0.25 or 0.'5MG'$ /DOS, (OZEMPIC, 0.25 OR 0.5 MG/DOSE,) 2 MG/1.5ML SOPN, Inject 0.5 mg into the skin once a week., Disp: 1 pen, Rfl: 2   No Known Allergies   Review of Systems  Constitutional: Negative.   Eyes: Negative for blurred vision.  Respiratory: Negative.   Cardiovascular: Negative.  Negative for chest pain.  Gastrointestinal: Negative.   Musculoskeletal: Positive for neck pain.  Neurological: Negative.   Psychiatric/Behavioral: Negative.      Today's Vitals   03/07/18 1602  BP: 132/74  Pulse: 73  Temp: 97.6 F (36.4 C)  TempSrc: Oral  Weight: (!) 361 lb 12.8 oz (164.1 kg)  Height: 5' (1.524 m)   Body mass index is 70.66 kg/m.   Objective:  Physical Exam Vitals signs and nursing note reviewed.  Constitutional:      Appearance: Normal appearance. She is obese.  HENT:     Head: Normocephalic and atraumatic.  Cardiovascular:     Rate and Rhythm: Normal rate and regular rhythm.     Heart sounds: Normal heart sounds.  Pulmonary:     Effort: Pulmonary effort is normal.     Breath sounds: Normal breath sounds.  Musculoskeletal:     Cervical back: She exhibits tenderness.  Skin:  General: Skin is warm.  Neurological:     General: No focal deficit present.     Mental Status: She is alert and oriented to person, place, and time.  Psychiatric:        Mood and Affect: Mood normal.         Assessment And Plan:     1. Obesity, diabetes, and hypertension syndrome ([place])  I will check dm labs as listed below. She will continue with Ozempic 0.'5mg'$  once weekly. She will rto in 4 months for re-evaluation. At that time, I will consider increasing her dose to '1mg'$  once weekly.   Her bp is fairly controlled. She is aware optimal bp is less than 130/80. She is encouraged to avoid adding salt  to her foods.   - Hemoglobin A1c - BMP8+EGFR  2. Cervicalgia  She will continue with chiropractic care. She is also encouraged to use muscle rub nightly.   3. Class 3 severe obesity due to excess calories with serious comorbidity and body mass index (BMI) greater than or equal to 70 in adult [facility])  She is again encouraged to incorporate more exercise into her daily routine. She is advised to to work towards at least 45 minutes five days weekly.   Maximino Greenland, MD

## 2018-03-08 LAB — BMP8+EGFR
BUN/Creatinine Ratio: 22 (ref 9–23)
BUN: 14 mg/dL (ref 6–24)
CO2: 21 mmol/L (ref 20–29)
Calcium: 9.3 mg/dL (ref 8.7–10.2)
Chloride: 103 mmol/L (ref 96–106)
Creatinine, Ser: 0.64 mg/dL (ref 0.57–1.00)
GFR calc Af Amer: 125 mL/min/{1.73_m2} (ref >59)
GFR calc non Af Amer: 108 mL/min/{1.73_m2} (ref >59)
Glucose: 104 mg/dL — ABNORMAL HIGH (ref 65–99)
Potassium: 4.1 mmol/L (ref 3.5–5.2)
Sodium: 136 mmol/L (ref 134–144)

## 2018-03-08 LAB — HEMOGLOBIN A1C
Est. average glucose Bld gHb Est-mCnc: 120 mg/dL
Hgb A1c MFr Bld: 5.8 % — ABNORMAL HIGH (ref 4.8–5.6)

## 2018-06-03 ENCOUNTER — Other Ambulatory Visit: Payer: Self-pay | Admitting: Internal Medicine

## 2018-06-08 ENCOUNTER — Other Ambulatory Visit: Payer: Self-pay

## 2018-06-08 MED ORDER — ALBUTEROL SULFATE HFA 108 (90 BASE) MCG/ACT IN AERS: INHALATION_SPRAY | RESPIRATORY_TRACT | 3 refills | Status: DC

## 2018-06-14 ENCOUNTER — Other Ambulatory Visit: Payer: Self-pay

## 2018-06-27 ENCOUNTER — Ambulatory Visit: Payer: BC Managed Care – PPO | Admitting: Internal Medicine

## 2018-06-27 ENCOUNTER — Other Ambulatory Visit: Payer: Self-pay

## 2018-06-27 ENCOUNTER — Encounter: Payer: Self-pay | Admitting: Internal Medicine

## 2018-06-27 VITALS — BP 116/78 | HR 63 | Temp 97.4°F | Ht 60.0 in | Wt 357.2 lb

## 2018-06-27 DIAGNOSIS — R0789 Other chest pain: Secondary | ICD-10-CM | POA: Diagnosis not present

## 2018-06-27 DIAGNOSIS — Z6841 Body Mass Index (BMI) 40.0 and over, adult: Secondary | ICD-10-CM | POA: Diagnosis not present

## 2018-06-27 DIAGNOSIS — R05 Cough: Secondary | ICD-10-CM

## 2018-06-27 DIAGNOSIS — R059 Cough, unspecified: Secondary | ICD-10-CM

## 2018-06-27 DIAGNOSIS — Z8249 Family history of ischemic heart disease and other diseases of the circulatory system: Secondary | ICD-10-CM

## 2018-06-27 MED ORDER — TRIAMCINOLONE ACETONIDE 40 MG/ML IJ SUSP
60.0000 mg | Freq: Once | INTRAMUSCULAR | Status: AC
  Administered 2018-06-27: 12:00:00 60 mg via INTRAMUSCULAR

## 2018-06-27 NOTE — Progress Notes (Signed)
Subjective:     Patient ID: Evelyn Wade , female    DOB: Jan 01, 1971 , 48 y.o.   MRN: [id number]   Chief Complaint  Patient presents with  . chest discomfort    HPI  Chest Pain   This is a recurrent problem. The current episode started 1 to 4 weeks ago. The onset quality is gradual. The problem occurs intermittently. The problem has been unchanged. The pain is present in the substernal region. The pain is at a severity of 6/10. The pain is moderate. The quality of the pain is described as tightness. The pain does not radiate. Associated symptoms include a cough. The cough has no precipitants. The cough is non-productive. There is no color change associated with the cough. The pain is aggravated by deep breathing. She has tried nothing for the symptoms. Risk factors include sedentary lifestyle and lack of exercise.     Past Medical History:  Diagnosis Date  . Diabetes mellitus without complication ([place])   . Hypertension   . Pneumonia    hx  . Sleep apnea    cpap 4-5 yrs     Family History  Problem Relation Age of Onset  . Ovarian cancer Mother   . Heart attack Father   . Diabetes Father   . Hypertension Brother      Current Outpatient Medications:  .  albuterol (VENTOLIN HFA) 108 (90 Base) MCG/ACT inhaler, Inhale 2 puffs every 4 to 8 hours as needed, Disp: 3 Inhaler, Rfl: 3 .  NIFEdipine (PROCARDIA-XL/NIFEDICAL-XL) 30 MG 24 hr tablet, TAKE 1 TABLET BY MOUTH EVERY DAY (Patient taking differently: Take 30 mg by mouth daily. ), Disp: 90 tablet, Rfl: 1 .  NOREL AD 4-10-325 MG TABS, TAKE 1 TABLET BY MOUTH TWICE A DAY, Disp: 20 tablet, Rfl: 0 .  Semaglutide,0.25 or 0.5MG /DOS, (OZEMPIC, 0.25 OR 0.5 MG/DOSE,) 2 MG/1.5ML SOPN, Inject 0.5 mg into the skin once a week., Disp: 1 pen, Rfl: 2 .  HYDROcodone-homatropine (HYDROMET) 5-1.5 MG/5ML syrup, Take 5 mLs by mouth every 6 (six) hours as needed for cough. (Patient not taking: Reported on 06/27/2018), Disp: 120 mL, Rfl: 0 .  ibuprofen  (ADVIL,MOTRIN) 600 MG tablet, Take 1 tablet (600 mg total) by mouth every 6 (six) hours as needed. (Patient not taking: Reported on 06/27/2018), Disp: 30 tablet, Rfl: 2 .  methocarbamol (ROBAXIN) 500 MG tablet, Take 1 tablet (500 mg total) by mouth at bedtime as needed for muscle spasms. (Patient not taking: Reported on 06/27/2018), Disp: 20 tablet, Rfl: 0   No Known Allergies   Review of Systems  Constitutional: Negative.   Respiratory: Positive for cough.        She c/o dry cough. No fever/chills. Cough is nonn-productive.   Cardiovascular: Positive for chest pain.  Gastrointestinal: Negative.   Neurological: Negative.   Psychiatric/Behavioral: Negative.      Today's Vitals   06/27/18 1055  BP: 116/78  Pulse: 63  Temp: (!) 97.4 F (36.3 C)  TempSrc: Oral  Weight: (!) 357 lb 3.2 oz (162 kg)  Height: 5' (1.524 m)   Body mass index is 69.76 kg/m.   Objective:  Physical Exam Vitals signs and nursing note reviewed.  Constitutional:      Appearance: Normal appearance.  HENT:     Head: Normocephalic and atraumatic.     Right Ear: Tympanic membrane, ear canal and external ear normal.     Left Ear: Tympanic membrane, ear canal and external ear normal.  Cardiovascular:  Rate and Rhythm: Normal rate and regular rhythm.     Heart sounds: Normal heart sounds.  Pulmonary:     Effort: Pulmonary effort is normal.     Breath sounds: Normal breath sounds.  Skin:    General: Skin is warm.  Neurological:     General: No focal deficit present.     Mental Status: She is alert.  Psychiatric:        Mood and Affect: Mood normal.        Behavior: Behavior normal.         Assessment And Plan:     1. Chest discomfort  Her sx are atypical. However, due to her risk factors, EKG performed. NO acute changes noted. She may benefit from PPI, her sx could be Gi-related.   - EKG 12-Lead  2. Cough  Her sx are likely related to allergic rhinitis. However, understandably so, she is  concerned about COVID-19. She requests antibody testing for COVID today. I will notify her of results once they are available for review.   - SAR CoV2 Serology (COVID 19)AB(IGG)IA - triamcinolone acetonide (KENALOG-40) injection 60 mg  3. Class 3 severe obesity due to excess calories with serious comorbidity and body mass index (BMI) of 60.0 to 69.9 in adult [facility])  Importance of achieving optimal weight to decrease risk of cardiovascular disease and cancers was discussed with the patient in full detail. She is encouraged to start slowly - start with 10 minutes twice daily at least three to four days per week and to gradually build to 30 minutes five days weekly. She was given tips to incorporate more activity into her daily routine - take stairs when possible, park farther away from her job, grocery stores, etc.        Maximino Greenland, MD    THE PATIENT IS ENCOURAGED TO PRACTICE SOCIAL DISTANCING DUE TO THE COVID-19 PANDEMIC.

## 2018-06-28 LAB — SARS-COV-2 ANTIBODY, IGA: SARS-CoV-2 Antibody, IgA: NEGATIVE

## 2018-06-28 LAB — SARS-COV-2 ANTIBODY, IGM: SARS-CoV-2 Antibody, IgM: NEGATIVE

## 2018-06-28 LAB — SAR COV2 SEROLOGY (COVID19)AB(IGG),IA: SARS-CoV-2 Ab, IgG: NEGATIVE

## 2018-07-20 ENCOUNTER — Other Ambulatory Visit: Payer: Self-pay | Admitting: Internal Medicine

## 2018-07-27 ENCOUNTER — Ambulatory Visit: Payer: BC Managed Care – PPO | Admitting: Internal Medicine

## 2018-07-27 ENCOUNTER — Other Ambulatory Visit: Payer: Self-pay

## 2018-07-27 ENCOUNTER — Encounter: Payer: Self-pay | Admitting: Internal Medicine

## 2018-07-27 VITALS — BP 136/78 | HR 70 | Temp 97.4°F | Ht 60.0 in | Wt 355.0 lb

## 2018-07-27 DIAGNOSIS — Z0000 Encounter for general adult medical examination without abnormal findings: Secondary | ICD-10-CM | POA: Diagnosis not present

## 2018-07-27 DIAGNOSIS — E1159 Type 2 diabetes mellitus with other circulatory complications: Secondary | ICD-10-CM | POA: Diagnosis not present

## 2018-07-27 DIAGNOSIS — I152 Hypertension secondary to endocrine disorders: Secondary | ICD-10-CM

## 2018-07-27 DIAGNOSIS — I10 Essential (primary) hypertension: Secondary | ICD-10-CM

## 2018-07-27 DIAGNOSIS — E669 Obesity, unspecified: Secondary | ICD-10-CM

## 2018-07-27 LAB — POCT URINALYSIS DIPSTICK
Bilirubin, UA: NEGATIVE
Blood, UA: NEGATIVE
Glucose, UA: NEGATIVE
Ketones, UA: NEGATIVE
Nitrite, UA: NEGATIVE
Protein, UA: NEGATIVE
Spec Grav, UA: 1.02 (ref 1.010–1.025)
Urobilinogen, UA: 0.2 E.U./dL
pH, UA: 7 (ref 5.0–8.0)

## 2018-07-27 LAB — POCT UA - MICROALBUMIN
Albumin/Creatinine Ratio, Urine, POC: <30
Creatinine, POC: 100 mg/dL
Microalbumin Ur, POC: 10 mg/L

## 2018-07-27 MED ORDER — OZEMPIC (0.25 OR 0.5 MG/DOSE) 2 MG/1.5ML ~~LOC~~ SOPN: 0.5000 mg | PEN_INJECTOR | SUBCUTANEOUS | 5 refills | Status: DC

## 2018-07-27 NOTE — Progress Notes (Signed)
Subjective:     Patient ID: Evelyn Wade , female    DOB: 1970/12/30 , 48 y.o.   MRN: [id number]   Chief Complaint  Patient presents with  . Annual Exam  . Diabetes  . Hypertension    HPI  She is here today for a full physical examination. She is followed by GYN for her pelvic examinations. She has no specific concerns or complaints at this time.   Diabetes She presents for her follow-up diabetic visit. She has type 2 diabetes mellitus. Her disease course has been stable. There are no hypoglycemic associated symptoms. Pertinent negatives for diabetes include no blurred vision and no chest pain. There are no hypoglycemic complications. Symptoms are stable. Risk factors for coronary artery disease include diabetes mellitus, hypertension, obesity and sedentary lifestyle. Her weight is decreasing steadily. She is following a diabetic diet. She participates in exercise intermittently. Her breakfast blood glucose is taken between 7-8 am. Her breakfast blood glucose range is generally 90-110 mg/dl. An ACE inhibitor/angiotensin II receptor blocker is contraindicated. She does not see a podiatrist. Hypertension This is a chronic problem. The current episode started more than 1 year ago. The problem is controlled. Pertinent negatives include no blurred vision or chest pain. Risk factors for coronary artery disease include diabetes mellitus, dyslipidemia, obesity and sedentary lifestyle. Past treatments include ACE inhibitors, diuretics and calcium channel blockers. The current treatment provides moderate improvement.     Past Medical History:  Diagnosis Date  . Diabetes mellitus without complication ([place])   . Hypertension   . Pneumonia    hx  . Sleep apnea    cpap 4-5 yrs     Family History  Problem Relation Age of Onset  . Ovarian cancer Mother   . Heart attack Father   . Diabetes Father   . Hypertension Brother      Current Outpatient Medications:  .  albuterol (VENTOLIN HFA) 108  (90 Base) MCG/ACT inhaler, Inhale 2 puffs every 4 to 8 hours as needed, Disp: 3 Inhaler, Rfl: 3 .  NIFEdipine (PROCARDIA-XL/NIFEDICAL-XL) 30 MG 24 hr tablet, TAKE 1 TABLET BY MOUTH EVERY DAY, Disp: 90 tablet, Rfl: 1 .  NOREL AD 4-10-325 MG TABS, TAKE 1 TABLET BY MOUTH TWICE A DAY, Disp: 20 tablet, Rfl: 0 .  Semaglutide,0.25 or 0.'5MG'$ /DOS, (OZEMPIC, 0.25 OR 0.5 MG/DOSE,) 2 MG/1.5ML SOPN, Inject 0.5 mg into the skin once a week., Disp: 1 pen, Rfl: 2 .  HYDROcodone-homatropine (HYDROMET) 5-1.5 MG/5ML syrup, Take 5 mLs by mouth every 6 (six) hours as needed for cough. (Patient not taking: Reported on 06/27/2018), Disp: 120 mL, Rfl: 0 .  ibuprofen (ADVIL,MOTRIN) 600 MG tablet, Take 1 tablet (600 mg total) by mouth every 6 (six) hours as needed. (Patient not taking: Reported on 06/27/2018), Disp: 30 tablet, Rfl: 2 .  methocarbamol (ROBAXIN) 500 MG tablet, Take 1 tablet (500 mg total) by mouth at bedtime as needed for muscle spasms. (Patient not taking: Reported on 06/27/2018), Disp: 20 tablet, Rfl: 0   No Known Allergies    The patient states she uses none for birth control. Last LMP was Patient's last menstrual period was 07/13/2018.. Negative for Dysmenorrhea Negative for: breast discharge, breast lump(s), breast pain and breast self exam. Associated symptoms include abnormal vaginal bleeding. Pertinent negatives include abnormal bleeding (hematology), anxiety, decreased libido, depression, difficulty falling sleep, dyspareunia, history of infertility, nocturia, sexual dysfunction, sleep disturbances, urinary incontinence, urinary urgency, vaginal discharge and vaginal itching. Diet regular.The patient states her exercise level is  intermittent.  . The patient's tobacco use is:  Social History   Tobacco Use  Smoking Status Never Smoker  Smokeless Tobacco Never Used  . She has been exposed to passive smoke. The patient's alcohol use is:  Social History   Substance and Sexual Activity  Alcohol Use No     Review of Systems  Constitutional: Negative.   HENT: Negative.   Eyes: Negative.  Negative for blurred vision.  Respiratory: Negative.   Cardiovascular: Negative.  Negative for chest pain.  Gastrointestinal: Negative.   Endocrine: Negative.   Genitourinary: Negative.   Musculoskeletal: Negative.   Skin: Negative.   Allergic/Immunologic: Negative.   Neurological: Negative.   Hematological: Negative.   Psychiatric/Behavioral: Negative.      Today's Vitals   07/27/18 0903  BP: 136/78  Pulse: 70  Temp: (!) 97.4 F (36.3 C)  TempSrc: Oral  Weight: (!) 355 lb (161 kg)  Height: 5' (1.524 m)   Body mass index is 69.33 kg/m.   Objective:  Physical Exam Vitals signs and nursing note reviewed.  Constitutional:      Appearance: Normal appearance. She is obese.  HENT:     Head: Normocephalic and atraumatic.     Right Ear: Tympanic membrane, ear canal and external ear normal.     Left Ear: Tympanic membrane, ear canal and external ear normal.     Nose: Nose normal.     Mouth/Throat:     Mouth: Mucous membranes are moist.     Pharynx: Oropharynx is clear.  Eyes:     Extraocular Movements: Extraocular movements intact.     Conjunctiva/sclera: Conjunctivae normal.     Pupils: Pupils are equal, round, and reactive to light.  Neck:     Musculoskeletal: Normal range of motion and neck supple.  Cardiovascular:     Rate and Rhythm: Normal rate and regular rhythm.     Pulses: Normal pulses.          Dorsalis pedis pulses are 2+ on the right side and 2+ on the left side.     Heart sounds: Normal heart sounds.  Pulmonary:     Effort: Pulmonary effort is normal.     Breath sounds: Normal breath sounds.  Chest:     Breasts: Tanner Score is 5.        Right: Normal. No swelling, bleeding, inverted nipple, mass, nipple discharge or skin change.        Left: Normal. No swelling, bleeding, inverted nipple, mass, nipple discharge or skin change.  Abdominal:     General: Abdomen is  flat. Bowel sounds are normal.     Palpations: Abdomen is soft.     Comments: Obese, difficult to assess organomegaly  Genitourinary:    Comments: deferred Musculoskeletal: Normal range of motion.  Feet:     Right foot:     Protective Sensation: 5 sites tested. 5 sites sensed.     Skin integrity: Skin integrity normal.     Toenail Condition: Right toenails are normal.     Left foot:     Protective Sensation: 5 sites tested. 5 sites sensed.     Skin integrity: Skin integrity normal.     Toenail Condition: Left toenails are normal.  Skin:    General: Skin is warm and dry.  Neurological:     General: No focal deficit present.     Mental Status: She is alert and oriented to person, place, and time.  Psychiatric:        Mood  and Affect: Mood normal.        Behavior: Behavior normal.         Assessment And Plan:     1. Routine general medical examination at health care facility  A full exam was performed. Importance of monthly self breast exams was discussed with the patient. PATIENT HAS BEEN ADVISED TO GET 30-45 MINUTES REGULAR EXERCISE NO LESS THAN FOUR TO FIVE DAYS PER WEEK - BOTH WEIGHTBEARING EXERCISES AND AEROBIC ARE RECOMMENDED.  SHE IS ADVISED TO FOLLOW A HEALTHY DIET WITH AT LEAST SIX FRUITS/VEGGIES PER DAY, DECREASE INTAKE OF RED MEAT, AND TO INCREASE FISH INTAKE TO TWO DAYS PER WEEK.  MEATS/FISH SHOULD NOT BE FRIED, BAKED OR BROILED IS PREFERABLE.  I SUGGEST WEARING SPF 50 SUNSCREEN ON EXPOSED PARTS AND ESPECIALLY WHEN IN THE DIRECT SUNLIGHT FOR AN EXTENDED PERIOD OF TIME.  PLEASE AVOID FAST FOOD RESTAURANTS AND INCREASE YOUR WATER INTAKE.  - CMP14+EGFR - CBC - Lipid panel - Hemoglobin A1c  2. Obesity, diabetes, and hypertension syndrome ([place])  Diabetic foot exam was performed.  I DISCUSSED WITH THE PATIENT AT LENGTH REGARDING THE GOALS OF GLYCEMIC CONTROL AND POSSIBLE LONG-TERM COMPLICATIONS.  I  ALSO STRESSED THE IMPORTANCE OF COMPLIANCE WITH HOME GLUCOSE MONITORING,  DIETARY RESTRICTIONS INCLUDING AVOIDANCE OF SUGARY DRINKS/PROCESSED FOODS,  ALONG WITH REGULAR EXERCISE.  I  ALSO STRESSED THE IMPORTANCE OF ANNUAL EYE EXAMS, SELF FOOT CARE AND COMPLIANCE WITH OFFICE VISITS.  BP is fairly controlled. She will continue with current meds for now. She is encouraged to avoid adding salt to her foods. Pt is aware that optimal blood pressure is less than 120/80. EKG performed, no new changes noted. She will rto in six months for re-evaluation.   Importance of achieving optimal weight to decrease risk of cardiovascular disease and cancers was discussed with the patient in full detail. She is encouraged to start slowly - start with 10 minutes twice daily at least three to four days per week and to gradually build to 30 minutes five days weekly. She was given tips to incorporate more activity into her daily routine - take stairs when possible, park farther away from her job, grocery stores, etc.    - EKG 12-Lead - POCT Urinalysis Dipstick (81002) - POCT UA - Microalbumin        Maximino Greenland, MD    THE PATIENT IS ENCOURAGED TO PRACTICE SOCIAL DISTANCING DUE TO THE COVID-19 PANDEMIC.

## 2018-07-27 NOTE — Patient Instructions (Signed)

## 2018-07-28 LAB — CBC
Hematocrit: 36.5 % (ref 34.0–46.6)
Hemoglobin: 10.9 g/dL — ABNORMAL LOW (ref 11.1–15.9)
MCH: 22.1 pg — ABNORMAL LOW (ref 26.6–33.0)
MCHC: 29.9 g/dL — ABNORMAL LOW (ref 31.5–35.7)
MCV: 74 fL — ABNORMAL LOW (ref 79–97)
Platelets: 522 10*3/uL — ABNORMAL HIGH (ref 150–450)
RBC: 4.94 x10E6/uL (ref 3.77–5.28)
RDW: 16.4 % — ABNORMAL HIGH (ref 11.7–15.4)
WBC: 9.3 10*3/uL (ref 3.4–10.8)

## 2018-07-28 LAB — CMP14+EGFR
ALT: 13 IU/L (ref 0–32)
AST: 15 IU/L (ref 0–40)
Albumin/Globulin Ratio: 1.2 (ref 1.2–2.2)
Albumin: 3.9 g/dL (ref 3.8–4.8)
Alkaline Phosphatase: 92 IU/L (ref 39–117)
BUN/Creatinine Ratio: 14 (ref 9–23)
BUN: 11 mg/dL (ref 6–24)
Bilirubin Total: 0.4 mg/dL (ref 0.0–1.2)
CO2: 26 mmol/L (ref 20–29)
Calcium: 9.7 mg/dL (ref 8.7–10.2)
Chloride: 101 mmol/L (ref 96–106)
Creatinine, Ser: 0.78 mg/dL (ref 0.57–1.00)
GFR calc Af Amer: 106 mL/min/{1.73_m2} (ref >59)
GFR calc non Af Amer: 92 mL/min/{1.73_m2} (ref >59)
Globulin, Total: 3.3 g/dL (ref 1.5–4.5)
Glucose: 99 mg/dL (ref 65–99)
Potassium: 4.6 mmol/L (ref 3.5–5.2)
Sodium: 138 mmol/L (ref 134–144)
Total Protein: 7.2 g/dL (ref 6.0–8.5)

## 2018-07-28 LAB — LIPID PANEL
Chol/HDL Ratio: 1.7 ratio (ref 0.0–4.4)
Cholesterol, Total: 162 mg/dL (ref 100–199)
HDL: 93 mg/dL (ref >39)
LDL Calculated: 60 mg/dL (ref 0–99)
Triglycerides: 45 mg/dL (ref 0–149)
VLDL Cholesterol Cal: 9 mg/dL (ref 5–40)

## 2018-07-28 LAB — HEMOGLOBIN A1C
Est. average glucose Bld gHb Est-mCnc: 120 mg/dL
Hgb A1c MFr Bld: 5.8 % — ABNORMAL HIGH (ref 4.8–5.6)

## 2018-07-30 LAB — IRON AND TIBC
Iron Saturation: 13 % — ABNORMAL LOW (ref 15–55)
Iron: 44 ug/dL (ref 27–159)
Total Iron Binding Capacity: 343 ug/dL (ref 250–450)
UIBC: 299 ug/dL (ref 131–425)

## 2018-07-30 LAB — SPECIMEN STATUS REPORT

## 2018-07-30 LAB — FERRITIN: Ferritin: 20 ng/mL (ref 15–150)

## 2018-08-03 ENCOUNTER — Encounter: Payer: Self-pay | Admitting: Internal Medicine

## 2018-08-03 ENCOUNTER — Other Ambulatory Visit: Payer: Self-pay | Admitting: Internal Medicine

## 2018-08-03 MED ORDER — FUSION PLUS PO CAPS: ORAL_CAPSULE | ORAL | 2 refills | Status: DC

## 2018-08-23 ENCOUNTER — Other Ambulatory Visit: Payer: Self-pay

## 2018-08-23 MED ORDER — FUSION PLUS PO CAPS: ORAL_CAPSULE | ORAL | 2 refills | Status: DC

## 2018-10-24 ENCOUNTER — Encounter: Payer: Self-pay | Admitting: Internal Medicine

## 2018-10-26 ENCOUNTER — Other Ambulatory Visit: Payer: Self-pay

## 2018-10-26 ENCOUNTER — Encounter: Payer: Self-pay | Admitting: Internal Medicine

## 2018-10-26 ENCOUNTER — Ambulatory Visit: Payer: BC Managed Care – PPO | Admitting: Internal Medicine

## 2018-10-26 VITALS — BP 160/90 | HR 63 | Temp 97.8°F | Ht 60.6 in | Wt 341.2 lb

## 2018-10-26 DIAGNOSIS — R519 Headache, unspecified: Secondary | ICD-10-CM

## 2018-10-26 DIAGNOSIS — R51 Headache: Secondary | ICD-10-CM | POA: Diagnosis not present

## 2018-10-26 DIAGNOSIS — I10 Essential (primary) hypertension: Secondary | ICD-10-CM

## 2018-10-26 DIAGNOSIS — E1169 Type 2 diabetes mellitus with other specified complication: Secondary | ICD-10-CM

## 2018-10-26 DIAGNOSIS — Z6841 Body Mass Index (BMI) 40.0 and over, adult: Secondary | ICD-10-CM

## 2018-10-26 DIAGNOSIS — G4733 Obstructive sleep apnea (adult) (pediatric): Secondary | ICD-10-CM | POA: Diagnosis not present

## 2018-10-26 MED ORDER — GLUCOSE BLOOD VI STRP: ORAL_STRIP | 12 refills | Status: AC

## 2018-10-26 MED ORDER — OLMESARTAN MEDOXOMIL-HCTZ 20-12.5 MG PO TABS: 1.0000 | ORAL_TABLET | Freq: Every day | ORAL | 1 refills | Status: DC

## 2018-10-26 NOTE — Progress Notes (Signed)
Subjective:     Patient ID: Evelyn Wade , female    DOB: 1970-10-02 , 48 y.o.   MRN: [id number]   Chief Complaint  Patient presents with  . Hypertension    HPI  She is here today for bp check. She reports her bp has been markedly elevated. She went to Ortho and bp elevated. She reports compliance with nifedipine. She was initially prescribed this due to her desire for conception. She reports she no longer wishes to get pregnant. She has also been having headaches. She denies visual disturbances Hypertension Associated symptoms include headaches.     Past Medical History:  Diagnosis Date  . Diabetes mellitus without complication ([place])   . Hypertension   . Pneumonia    hx  . Sleep apnea    cpap 4-5 yrs     Family History  Problem Relation Age of Onset  . Ovarian cancer Mother   . Heart attack Father   . Diabetes Father   . Hypertension Brother      Current Outpatient Medications:  .  albuterol (VENTOLIN HFA) 108 (90 Base) MCG/ACT inhaler, Inhale 2 puffs every 4 to 8 hours as needed, Disp: 3 Inhaler, Rfl: 3 .  NOREL AD 4-10-325 MG TABS, TAKE 1 TABLET BY MOUTH TWICE A DAY, Disp: 20 tablet, Rfl: 0 .  glucose blood test strip, Use as instructed, Disp: 50 each, Rfl: 12 .  HYDROcodone-homatropine (HYDROMET) 5-1.5 MG/5ML syrup, Take 5 mLs by mouth every 6 (six) hours as needed for cough. (Patient not taking: Reported on 06/27/2018), Disp: 120 mL, Rfl: 0 .  ibuprofen (ADVIL,MOTRIN) 600 MG tablet, Take 1 tablet (600 mg total) by mouth every 6 (six) hours as needed. (Patient not taking: Reported on 06/27/2018), Disp: 30 tablet, Rfl: 2 .  Iron-FA-B Cmp-C-Biot-Probiotic (FUSION PLUS) CAPS, One tab po qd (Patient not taking: Reported on 10/26/2018), Disp: 30 capsule, Rfl: 2 .  methocarbamol (ROBAXIN) 500 MG tablet, Take 1 tablet (500 mg total) by mouth at bedtime as needed for muscle spasms. (Patient not taking: Reported on 06/27/2018), Disp: 20 tablet, Rfl: 0 .   olmesartan-hydrochlorothiazide (BENICAR HCT) 20-12.5 MG tablet, Take 1 tablet by mouth daily., Disp: 30 tablet, Rfl: 1 .  Semaglutide,0.25 or 0.5MG /DOS, (OZEMPIC, 0.25 OR 0.5 MG/DOSE,) 2 MG/1.5ML SOPN, Inject 0.5 mg into the skin once a week. (Patient not taking: Reported on 10/26/2018), Disp: 1 pen, Rfl: 5   No Known Allergies   Review of Systems  Constitutional: Negative.   Respiratory: Negative.   Cardiovascular: Negative.   Gastrointestinal: Negative.   Neurological: Positive for headaches.       She admits she has been having generalized h/a. She denies visual disturbances. She reports h/o OSA. Admits noncompliance with CPAP. It has been more than 5 years since last sleep study. Willing to get new study.   Psychiatric/Behavioral: Negative.      Today's Vitals   10/26/18 1645  BP: (!) 160/90  Pulse: 63  Temp: 97.8 F (36.6 C)  TempSrc: Oral  SpO2: 97%  Weight: (!) 341 lb 3.2 oz (154.8 kg)  Height: 5' 0.6" (1.539 m)   Body mass index is 65.32 kg/m.   Objective:  Physical Exam Vitals signs and nursing note reviewed.  Constitutional:      Appearance: Normal appearance.  HENT:     Head: Normocephalic and atraumatic.  Cardiovascular:     Rate and Rhythm: Normal rate and regular rhythm.     Heart sounds: Normal heart sounds.  Pulmonary:  Effort: Pulmonary effort is normal.     Breath sounds: Normal breath sounds.  Skin:    General: Skin is warm.  Neurological:     General: No focal deficit present.     Mental Status: She is alert.  Psychiatric:        Mood and Affect: Mood normal.        Behavior: Behavior normal.         Assessment And Plan:     1. Essential hypertension, benign  Uncontrolled. I will start her on olmesartan 20/12.5mg  daily. She will not refill nifedipine. She is encouraged to avoid adding salt to her foods. Untreated OSA is likely contributing to her elevated bp. She will rto in four weeks for re-evaluation.   2. Nonintractable episodic  headache, unspecified headache type  Likely due to elevated bp. She will let me know if her sx persist.   3. Type 2 diabetes mellitus with other specified complication, without long-term current use of insulin ([place])  Refill for test strips were sent to the pharmacy. She admits she has not been taking Ozempic. She is encouraged to start back next week.  4. OSA (obstructive sleep apnea)  She agrees to Neuro referral for repeat sleep testing. Importance of CPAP compliance was discussed with the patient .  5. Class 3 severe obesity due to excess calories with serious comorbidity and body mass index (BMI) of 60.0 to 69.9 in adult [facility])  She was congratulated on her 14 pound weight loss.  She is encouraged to keep up the great work.    Maximino Greenland, MD    THE PATIENT IS ENCOURAGED TO PRACTICE SOCIAL DISTANCING DUE TO THE COVID-19 PANDEMIC.

## 2018-10-26 NOTE — Patient Instructions (Addendum)
Magnesium 400mg  nightly  - you can get this over the counter   Heart-Healthy Eating Plan Heart-healthy meal planning includes:  Eating less unhealthy fats.  Eating more healthy fats.  Making other changes in your diet. Talk with your doctor or a diet specialist (dietitian) to create an eating plan that is right for you. What is my plan? Your doctor may recommend an eating plan that includes:  Total fat: ______% or less of total calories a day.  Saturated fat: ______% or less of total calories a day.  Cholesterol: less than _________mg a day. What are tips for following this plan? Cooking Avoid frying your food. Try to bake, boil, grill, or broil it instead. You can also reduce fat by:  Removing the skin from poultry.  Removing all visible fats from meats.  Steaming vegetables in water or broth. Meal planning   At meals, divide your plate into four equal parts: ? Fill one-half of your plate with vegetables and green salads. ? Fill one-fourth of your plate with whole grains. ? Fill one-fourth of your plate with lean protein foods.  Eat 4-5 servings of vegetables per day. A serving of vegetables is: ? 1 cup of raw or cooked vegetables. ? 2 cups of raw leafy greens.  Eat 4-5 servings of fruit per day. A serving of fruit is: ? 1 medium whole fruit. ?  cup of dried fruit. ?  cup of fresh, frozen, or canned fruit. ?  cup of 100% fruit juice.  Eat more foods that have soluble fiber. These are apples, broccoli, carrots, beans, peas, and barley. Try to get 20-30 g of fiber per day.  Eat 4-5 servings of nuts, legumes, and seeds per week: ? 1 serving of dried beans or legumes equals  cup after being cooked. ? 1 serving of nuts is  cup. ? 1 serving of seeds equals 1 tablespoon. General information  Eat more home-cooked food. Eat less restaurant, buffet, and fast food.  Limit or avoid alcohol.  Limit foods that are high in starch and sugar.  Avoid fried foods.   Lose weight if you are overweight.  Keep track of how much salt (sodium) you eat. This is important if you have high blood pressure. Ask your doctor to tell you more about this.  Try to add vegetarian meals each week. Fats  Choose healthy fats. These include olive oil and canola oil, flaxseeds, walnuts, almonds, and seeds.  Eat more omega-3 fats. These include salmon, mackerel, sardines, tuna, flaxseed oil, and ground flaxseeds. Try to eat fish at least 2 times each week.  Check food labels. Avoid foods with trans fats or high amounts of saturated fat.  Limit saturated fats. ? These are often found in animal products, such as meats, butter, and cream. ? These are also found in plant foods, such as palm oil, palm kernel oil, and coconut oil.  Avoid foods with partially hydrogenated oils in them. These have trans fats. Examples are stick margarine, some tub margarines, cookies, crackers, and other baked goods. What foods can I eat? Fruits All fresh, canned (in natural juice), or frozen fruits. Vegetables Fresh or frozen vegetables (raw, steamed, roasted, or grilled). Green salads. Grains Most grains. Choose whole wheat and whole grains most of the time. Rice and pasta, including brown rice and pastas made with whole wheat. Meats and other proteins Lean, well-trimmed beef, veal, pork, and lamb. Chicken and [place] without skin. All fish and shellfish. Wild duck, rabbit, pheasant, and venison. Egg  whites or low-cholesterol egg substitutes. Dried beans, peas, lentils, and tofu. Seeds and most nuts. Dairy Low-fat or nonfat cheeses, including ricotta and mozzarella. Skim or 1% milk that is liquid, powdered, or evaporated. Buttermilk that is made with low-fat milk. Nonfat or low-fat yogurt. Fats and oils Non-hydrogenated (trans-free) margarines. Vegetable oils, including soybean, sesame, sunflower, olive, peanut, safflower, corn, canola, and cottonseed. Salad dressings or mayonnaise made with a  vegetable oil. Beverages Mineral water. Coffee and tea. Diet carbonated beverages. Sweets and desserts Sherbet, gelatin, and fruit ice. Small amounts of dark chocolate. Limit all sweets and desserts. Seasonings and condiments All seasonings and condiments. The items listed above may not be a complete list of foods and drinks you can eat. Contact a dietitian for more options. What foods should I avoid? Fruits Canned fruit in heavy syrup. Fruit in cream or butter sauce. Fried fruit. Limit coconut. Vegetables Vegetables cooked in cheese, cream, or butter sauce. Fried vegetables. Grains Breads that are made with saturated or trans fats, oils, or whole milk. Croissants. Sweet rolls. Donuts. High-fat crackers, such as cheese crackers. Meats and other proteins Fatty meats, such as hot dogs, ribs, sausage, bacon, rib-eye roast or steak. High-fat deli meats, such as salami and bologna. Caviar. Domestic duck and goose. Organ meats, such as liver. Dairy Cream, sour cream, cream cheese, and creamed cottage cheese. Whole-milk cheeses. Whole or 2% milk that is liquid, evaporated, or condensed. Whole buttermilk. Cream sauce or high-fat cheese sauce. Yogurt that is made from whole milk. Fats and oils Meat fat, or shortening. Cocoa butter, hydrogenated oils, palm oil, coconut oil, palm kernel oil. Solid fats and shortenings, including bacon fat, salt pork, lard, and butter. Nondairy cream substitutes. Salad dressings with cheese or sour cream. Beverages Regular sodas and juice drinks with added sugar. Sweets and desserts Frosting. Pudding. Cookies. Cakes. Pies. Milk chocolate or white chocolate. Buttered syrups. Full-fat ice cream or ice cream drinks. The items listed above may not be a complete list of foods and drinks to avoid. Contact a dietitian for more information. Summary  Heart-healthy meal planning includes eating less unhealthy fats, eating more healthy fats, and making other changes in your  diet.  Eat a balanced diet. This includes fruits and vegetables, low-fat or nonfat dairy, lean protein, nuts and legumes, whole grains, and heart-healthy oils and fats. This information is not intended to replace advice given to you by your health care provider. Make sure you discuss any questions you have with your health care provider. Document Released: 07/28/2011 Document Revised: 04/01/2017 Document Reviewed: 03/05/2017 Elsevier Patient Education  2020 [employer].

## 2018-11-08 ENCOUNTER — Ambulatory Visit: Payer: BC Managed Care – PPO | Admitting: Neurology

## 2018-11-08 ENCOUNTER — Encounter: Payer: Self-pay | Admitting: Neurology

## 2018-11-08 ENCOUNTER — Other Ambulatory Visit: Payer: Self-pay

## 2018-11-08 VITALS — BP 120/78 | HR 80 | Temp 97.5°F | Ht 60.0 in | Wt 334.0 lb

## 2018-11-08 DIAGNOSIS — R0683 Snoring: Secondary | ICD-10-CM | POA: Diagnosis not present

## 2018-11-08 DIAGNOSIS — I10 Essential (primary) hypertension: Secondary | ICD-10-CM | POA: Diagnosis not present

## 2018-11-08 DIAGNOSIS — D509 Iron deficiency anemia, unspecified: Secondary | ICD-10-CM

## 2018-11-08 DIAGNOSIS — E669 Obesity, unspecified: Secondary | ICD-10-CM | POA: Diagnosis not present

## 2018-11-08 NOTE — Patient Instructions (Signed)

## 2018-11-08 NOTE — Progress Notes (Signed)
SLEEP MEDICINE CLINIC    Provider:  Larey Seat, MD  Primary Care Physician:  Glendale Chard, [address] [place] [address] [place] [address]     Referring Provider: Glendale Chard, [place] [place],  [place] [place] [address]          Chief Complaint according to patient   Patient presents with:    . New Patient (Initial Visit)           HISTORY OF PRESENT ILLNESS:  Evelyn Wade is a 48 y.o. year old African American female patient seen upon a referral on 11/08/2018 from Dr Glendale Chard, MD.   Chief concern according to patient : pt states that she has been complaining of headaches and her PCP wanted her to evaluate sleep apnea. pt states she had a SS 8-10 yrs ago and used CPAP and has quit after a year.    I have the pleasure of seeing Evelyn Wade today, a right -handed Black or [place] American female with a possible sleep disorder.  She  has a past medical history of Diabetes mellitus without complication ([place]), Hypertension, Pneumonia, and Sleep apnea.    Sleep relevant medical history: Morbid obesity, Nocturia : once at night, no Sleep walking, Tonsillectomy at ge 20, no head or cervical spine trauma/surgery.    Family medical /sleep history: brother on CPAP with OSA, no insomnia, no sleep walkers.    Social history: Patient is working as Optometrist at [employer].  She and lives in a household with 3 persons . Family status is married , with a 27 year old child.  The patient currently works  full time, can work from home. Pets are not present. Tobacco use: none .  ETOH use ; none ,  Caffeine intake in form of Coffee( rare ) Soda( none ) Tea ( none ) or energy drinks. Regular exercise in form of walking   Hobbies : reading.    Sleep habits are as follows: The patient's dinner time is between 8 PM. The patient goes to bed at 10 PM and has no trouble to  Initiate sleep, she  continues to sleep for 7 hours, wakes for one  bathroom  break, at 2.30 AM.   The preferred sleep position is lateral , with the support of 2 pillows. Dreams are reportedly frequent/vivid, but not nightmarish. The bedroom is cool, quiet and dark.  5.30 AM is the usual rise time. The patient wakes up with her husband's alarm.  She reports only sometimes feeling refreshed or restored in AM, with symptoms such as dry mouth- frequent morning headaches, she is not woken by headaches.  Naps are taken infrequently, only on weekend  lasting as long as her son's naps.   Review of Systems: Out of a complete 14 system review, the patient complains of only the following symptoms, and all other reviewed systems are negative.:  Fatigue, sleepiness , loud snoring, witnessed apnea , palpitations of sleep.    How likely are you to doze in the following situations: 0 = not likely, 1 = slight chance, 2 = moderate chance, 3 = high chance   Sitting and Reading? Watching Television? Sitting inactive in a public place (theater or meeting)? As a passenger in a car for an hour without a break? Lying down in the afternoon when circumstances permit? Sitting and talking to someone? Sitting quietly after lunch without alcohol? In a car, while stopped for a few minutes  in traffic?   Total = 7/ 24 points   FSS endorsed at 22/ 63 points.   Social History   Socioeconomic History  . Marital status: Married    Spouse name: Not on file  . Number of children: Not on file  . Years of education: Not on file  . Highest education level: Not on file  Occupational History  . Not on file  Social Needs  . Financial resource strain: Not on file  . Food insecurity    Worry: Not on file    Inability: Not on file  . Transportation needs    Medical: Not on file    Non-medical: Not on file  Tobacco Use  . Smoking status: Never Smoker  . Smokeless tobacco: Never Used  Substance and Sexual Activity  . Alcohol use: No  . Drug use: No  . Sexual activity: Yes    Birth  control/protection: None  Lifestyle  . Physical activity    Days per week: Not on file    Minutes per session: Not on file  . Stress: Not on file  Relationships  . Social Herbalist on phone: Not on file    Gets together: Not on file    Attends religious service: Not on file    Active member of club or organization: Not on file    Attends meetings of clubs or organizations: Not on file    Relationship status: Not on file  Other Topics Concern  . Not on file  Social History Narrative  . Not on file    Family History  Problem Relation Age of Onset  . Ovarian cancer Mother   . Heart attack Father   . Diabetes Father   . Hypertension Brother     Past Medical History:  Diagnosis Date  . Diabetes mellitus without complication ([place])   . Hypertension   . Pneumonia    hx  . Sleep apnea    cpap 4-5 yrs    Past Surgical History:  Procedure Laterality Date  . BARIATRIC SURGERY    . CESAREAN SECTION N/A 08/17/2014   Procedure: CESAREAN SECTION;  Surgeon: Everett Graff, MD;  Location: [facility];  Service: Obstetrics;  Laterality: N/A;  . KNEE ARTHROSCOPY Left   . SUBMANDIBULAR GLAND EXCISION Left 09/07/2012   Procedure: EXCISION SUBMANDIBULAR GLAND;  Surgeon: Ascencion Dike, MD;  Location: [facility];  Service: ENT;  Laterality: Left;  Excision Submandibular Gland  . TONSILLECTOMY       Current Outpatient Medications on File Prior to Visit  Medication Sig Dispense Refill  . albuterol (VENTOLIN HFA) 108 (90 Base) MCG/ACT inhaler Inhale 2 puffs every 4 to 8 hours as needed 3 Inhaler 3  . glucose blood test strip Use as instructed 50 each 12  . HYDROcodone-homatropine (HYDROMET) 5-1.5 MG/5ML syrup Take 5 mLs by mouth every 6 (six) hours as needed for cough. 120 mL 0  . ibuprofen (ADVIL,MOTRIN) 600 MG tablet Take 1 tablet (600 mg total) by mouth every 6 (six) hours as needed. 30 tablet 2  . NOREL AD 4-10-325 MG TABS TAKE 1 TABLET BY MOUTH TWICE A DAY 20 tablet 0  .  olmesartan-hydrochlorothiazide (BENICAR HCT) 20-12.5 MG tablet Take 1 tablet by mouth daily. 30 tablet 1  . Semaglutide,0.25 or 0.5MG /DOS, (OZEMPIC, 0.25 OR 0.5 MG/DOSE,) 2 MG/1.5ML SOPN Inject 0.5 mg into the skin once a week. 1 pen 5   No current facility-administered medications on file prior to visit.  No Known Allergies  Physical exam:  Today's Vitals   11/08/18 0850  BP: 120/78  Pulse: 80  Temp: (!) 97.5 F (36.4 C)  Weight: (!) 334 lb (151.5 kg)  Height: 5' (1.524 m)   Body mass index is 65.23 kg/m.   Wt Readings from Last 3 Encounters:  11/08/18 (!) 334 lb (151.5 kg)  10/26/18 (!) 341 lb 3.2 oz (154.8 kg)  07/27/18 (!) 355 lb (161 kg)     Ht Readings from Last 3 Encounters:  11/08/18 5' (1.524 m)  10/26/18 5' 0.6" (1.539 m)  07/27/18 5' (1.524 m)      General: The patient is awake, alert and appears not in acute distress. The patient is well groomed. Head: Normocephalic, atraumatic. Neck is supple.  Mallampati; 5 ,  neck circumference: 17 inches . Nasal airflow is patent.  Retrognathia is seen.  Dental status: intact.  Cardiovascular:  Regular rate and cardiac rhythm by pulse, without distended neck veins. Respiratory: Lungs are clear to auscultation.  Skin:  Without evidence of ankle edema, or rash. Trunk: The patient's posture is erect.   Neurologic exam : The patient is awake and alert, oriented to place and time.   Memory subjective described as intact.  Attention span & concentration ability appears normal.  Speech is fluent,  without  dysarthria, dysphonia or aphasia.  Mood and affect are appropriate.   Cranial nerves: no loss of smell or taste reported  Pupils are equal and briskly reactive to light. Funduscopic exam normal, intact.. She reports a sensation of pressure behind her eyes.   Extraocular movements in vertical and horizontal planes were intact and without nystagmus.  No Diplopia. Visual fields by finger perimetry are intact. Hearing  was intact to soft voice and finger rubbing.    Facial sensation intact to fine touch.  Facial motor strength is symmetric and tongue and uvula move midline.  Neck ROM : rotation, tilt and flexion extension were normal for age and shoulder shrug was symmetrical.    Motor exam:  Symmetric bulk, tone and ROM.   Normal tone without cog- wheeling, symmetric grip strength .   Sensory:  Fine touch, pinprick and vibration were tested  and  normal.  Proprioception tested in the upper extremities was normal.   Coordination: Rapid alternating movements in the fingers/hands were of normal speed.  The Finger-to-nose maneuver was intact without evidence of ataxia, dysmetria or tremor.   Gait and station: Patient could rise unassisted from a seated position, walked without assistive device.  Stance with wider base and the patient turned with 3 steps.  Toe and heel walk were deferred.  Deep tendon reflexes: in the  upper and lower extremities are symmetric and intact.  Babinski response was deferred .       After spending a total time of  40 minutes face to face and additional time for physical and neurologic examination, review of laboratory studies,  I reviewed medication and referral notes,  personal review of  other testing and review of referral information / records as far as provided in visit, I have established the following assessments:  1) High risk for obesity hypoventilation and for benign intracranial hypertension.  2) BMI has been elevated since childhood- 65 .  3) former diagnosis of OSA, tested at [facility]- Comorbidities- HRN, not DM. No thyroid disease.     My Plan is to proceed with:  1) attended sleep study- preferred because of high risk for hypoventilation.  2) if [employer] is not agreeable to in-lab testing, will do HST.   3) rv in 2-3 month   I would like to thank Glendale Chard, MD and Glendale Chard, [address] [place],   [place] [address] for allowing me to meet with and to take care of this pleasant patient.    Electronically signed by: Larey Seat, MD 11/08/2018 9:01 AM  [facility] Board certified by The [employer] of Sleep Medicine and Diplomate of the [employer] of Sleep Medicine. Board certified In Neurology through the [place], Fellow of the [employer] of Neurology. Medical Director of [facility].

## 2018-11-21 ENCOUNTER — Other Ambulatory Visit: Payer: Self-pay | Admitting: Internal Medicine

## 2018-11-28 ENCOUNTER — Encounter: Payer: Self-pay | Admitting: Internal Medicine

## 2018-11-28 ENCOUNTER — Ambulatory Visit: Payer: BC Managed Care – PPO | Admitting: Internal Medicine

## 2018-11-28 ENCOUNTER — Other Ambulatory Visit: Payer: Self-pay

## 2018-11-28 VITALS — BP 138/88 | HR 76 | Temp 97.5°F | Ht 60.0 in | Wt 326.6 lb

## 2018-11-28 DIAGNOSIS — Z6841 Body Mass Index (BMI) 40.0 and over, adult: Secondary | ICD-10-CM

## 2018-11-28 DIAGNOSIS — Z23 Encounter for immunization: Secondary | ICD-10-CM | POA: Diagnosis not present

## 2018-11-28 DIAGNOSIS — I10 Essential (primary) hypertension: Secondary | ICD-10-CM | POA: Diagnosis not present

## 2018-11-28 DIAGNOSIS — R0981 Nasal congestion: Secondary | ICD-10-CM | POA: Diagnosis not present

## 2018-11-28 DIAGNOSIS — E1169 Type 2 diabetes mellitus with other specified complication: Secondary | ICD-10-CM

## 2018-11-28 NOTE — Patient Instructions (Signed)

## 2018-11-28 NOTE — Progress Notes (Signed)
Subjective:     Patient ID: Evelyn Wade , female    DOB: [birth year] , 48 y.o.   MRN: [id number]   Chief Complaint  Patient presents with  . Hypertension  . Diabetes    HPI  She is here today for a bp check. She was started on olmesartan/hctz at her last visit. She has tolerated the medication without any adverse effects. She thinks her bp is up some today because she just found out her Mom has recurrence of her ovarian cancer.  Diabetes She presents for her follow-up diabetic visit. She has type 2 diabetes mellitus. There are no hypoglycemic associated symptoms. There are no diabetic associated symptoms. There are no hypoglycemic complications. There are no diabetic complications. Risk factors for coronary artery disease include hypertension and obesity. She participates in exercise intermittently. An ACE inhibitor/angiotensin II receptor blocker is being taken.     Past Medical History:  Diagnosis Date  . Diabetes mellitus without complication ([place])   . Hypertension   . Pneumonia    hx  . Sleep apnea    cpap 4-5 yrs     Family History  Problem Relation Age of Onset  . Ovarian cancer Mother   . Heart attack Father   . Diabetes Father   . Hypertension Brother      Current Outpatient Medications:  .  albuterol (VENTOLIN HFA) 108 (90 Base) MCG/ACT inhaler, Inhale 2 puffs every 4 to 8 hours as needed, Disp: 3 Inhaler, Rfl: 3 .  glucose blood test strip, Use as instructed, Disp: 50 each, Rfl: 12 .  HYDROcodone-homatropine (HYDROMET) 5-1.5 MG/5ML syrup, Take 5 mLs by mouth every 6 (six) hours as needed for cough., Disp: 120 mL, Rfl: 0 .  ibuprofen (ADVIL,MOTRIN) 600 MG tablet, Take 1 tablet (600 mg total) by mouth every 6 (six) hours as needed., Disp: 30 tablet, Rfl: 2 .  NOREL AD 4-10-325 MG TABS, TAKE 1 TABLET BY MOUTH TWICE A DAY, Disp: 20 tablet, Rfl: 0 .  olmesartan-hydrochlorothiazide (BENICAR HCT) 20-12.5 MG tablet, TAKE 1 TABLET BY MOUTH EVERY DAY, Disp: 30 tablet,  Rfl: 1 .  Semaglutide,0.25 or 0.5MG/DOS, (OZEMPIC, 0.25 OR 0.5 MG/DOSE,) 2 MG/1.5ML SOPN, Inject 0.5 mg into the skin once a week., Disp: 1 pen, Rfl: 5   No Known Allergies   Review of Systems  Constitutional: Negative.   HENT: Positive for postnasal drip.        She also has sinus congestion. No fever/chills. No ill contacts. Has coworker whose cousin had Eva. She does not work closely with this [employer].   Respiratory: Negative.   Cardiovascular: Negative.   Gastrointestinal: Negative.   Neurological: Negative.   Psychiatric/Behavioral: Negative.      Today's Vitals   11/28/18 1608  BP: 138/88  Pulse: 76  Temp: (!) 97.5 F (36.4 C)  TempSrc: Oral  Weight: (!) 326 lb 9.6 oz (148.1 kg)  Height: 5' (1.524 m)   Body mass index is 63.78 kg/m.   Objective:  Physical Exam Vitals signs and nursing note reviewed.  Constitutional:      Appearance: Normal appearance.  HENT:     Head: Normocephalic and atraumatic.  Cardiovascular:     Rate and Rhythm: Normal rate and regular rhythm.     Heart sounds: Normal heart sounds.  Pulmonary:     Effort: Pulmonary effort is normal.     Breath sounds: Normal breath sounds.  Skin:    General: Skin is warm.  Neurological:  General: No focal deficit present.     Mental Status: She is alert.  Psychiatric:        Mood and Affect: Mood normal.        Behavior: Behavior normal.         Assessment And Plan:     1. Essential hypertension, benign  Chronic, fair control. BP reading at another office visit was wnl. She will continue with current meds. I will check a bmp today.   2. Type 2 diabetes mellitus with other specified complication, without long-term current use of insulin ([place])  I will refer her to Dr. Delman Cheadle for diabetic eye exam as requested.  I will check labs as listed below. She was congratulated on her healthy lifestyle changes and encouraged to keep up the great work!   - Ambulatory referral to Ophthalmology -  BMP8+EGFR - Hemoglobin A1c  3. Sinus congestion  I think her sx are due to allergic rhinitis. She was given samples of Norel AD to take once in am, and zyrtec to take in the evenings. She will let me know if her sx persist/worsen. Per her request, I will also perform COVID testing.   - Novel Coronavirus, NAA ([facility])  4. Need for influenza vaccination  - Flu Vaccine QUAD 6+ mos PF IM (Fluarix Quad PF)  5. Class 3 severe obesity due to excess calories with serious comorbidity and body mass index (BMI) of 60.0 to 69.9 in adult [facility])  She was congratulated on her 15 pound weight loss in the past month. She is encouraged to keep up the great work.  She is encouraged to aim for 150 minutes of exercise per week.        Maximino Greenland, MD    THE PATIENT IS ENCOURAGED TO PRACTICE SOCIAL DISTANCING DUE TO THE COVID-19 PANDEMIC.

## 2018-11-29 LAB — BMP8+EGFR
BUN/Creatinine Ratio: 28 — ABNORMAL HIGH (ref 9–23)
BUN: 22 mg/dL (ref 6–24)
CO2: 25 mmol/L (ref 20–29)
Calcium: 10.1 mg/dL (ref 8.7–10.2)
Chloride: 102 mmol/L (ref 96–106)
Creatinine, Ser: 0.8 mg/dL (ref 0.57–1.00)
GFR calc Af Amer: 102 mL/min/{1.73_m2} (ref >59)
GFR calc non Af Amer: 89 mL/min/{1.73_m2} (ref >59)
Glucose: 100 mg/dL — ABNORMAL HIGH (ref 65–99)
Potassium: 4.3 mmol/L (ref 3.5–5.2)
Sodium: 139 mmol/L (ref 134–144)

## 2018-11-29 LAB — HEMOGLOBIN A1C
Est. average glucose Bld gHb Est-mCnc: 120 mg/dL
Hgb A1c MFr Bld: 5.8 % — ABNORMAL HIGH (ref 4.8–5.6)

## 2018-12-01 ENCOUNTER — Encounter: Payer: Self-pay | Admitting: Internal Medicine

## 2018-12-01 LAB — SPECIMEN STATUS REPORT

## 2018-12-01 LAB — NOVEL CORONAVIRUS, NAA: SARS-CoV-2, NAA: NOT DETECTED

## 2018-12-25 ENCOUNTER — Other Ambulatory Visit: Payer: Self-pay | Admitting: Internal Medicine

## 2019-01-01 ENCOUNTER — Ambulatory Visit (INDEPENDENT_AMBULATORY_CARE_PROVIDER_SITE_OTHER): Payer: BC Managed Care – PPO | Admitting: Neurology

## 2019-01-01 DIAGNOSIS — R0683 Snoring: Secondary | ICD-10-CM

## 2019-01-01 DIAGNOSIS — E669 Obesity, unspecified: Secondary | ICD-10-CM

## 2019-01-01 DIAGNOSIS — G4733 Obstructive sleep apnea (adult) (pediatric): Secondary | ICD-10-CM | POA: Diagnosis not present

## 2019-01-01 DIAGNOSIS — I10 Essential (primary) hypertension: Secondary | ICD-10-CM

## 2019-01-01 DIAGNOSIS — D509 Iron deficiency anemia, unspecified: Secondary | ICD-10-CM

## 2019-01-09 ENCOUNTER — Telehealth: Payer: Self-pay | Admitting: Neurology

## 2019-01-09 ENCOUNTER — Other Ambulatory Visit: Payer: Self-pay

## 2019-01-09 ENCOUNTER — Encounter: Payer: Self-pay | Admitting: Internal Medicine

## 2019-01-09 ENCOUNTER — Telehealth (INDEPENDENT_AMBULATORY_CARE_PROVIDER_SITE_OTHER): Payer: BC Managed Care – PPO | Admitting: Internal Medicine

## 2019-01-09 VITALS — Ht 60.0 in

## 2019-01-09 DIAGNOSIS — M542 Cervicalgia: Secondary | ICD-10-CM | POA: Diagnosis not present

## 2019-01-09 DIAGNOSIS — G4733 Obstructive sleep apnea (adult) (pediatric): Secondary | ICD-10-CM | POA: Insufficient documentation

## 2019-01-09 DIAGNOSIS — J029 Acute pharyngitis, unspecified: Secondary | ICD-10-CM

## 2019-01-09 MED ORDER — TRAMADOL HCL 50 MG PO TABS: 50.0000 mg | ORAL_TABLET | Freq: Four times a day (QID) | ORAL | 0 refills | Status: DC | PRN

## 2019-01-09 NOTE — Telephone Encounter (Signed)
I called pt. I advised pt that Dr. Brett Fairy reviewed their sleep study results and found that severe sleep apnea and recommends that pt be treated with a cpap. Dr. Brett Fairy recommends that pt return for a repeat sleep study in order to properly titrate the cpap and ensure a good mask fit. Pt is agreeable to returning for a titration study. I advised pt that our sleep lab will file with pt's insurance and call pt to schedule the sleep study when we hear back from the pt's insurance regarding coverage of this sleep study. Pt verbalized understanding of results. Pt had no questions at this time but was encouraged to call back if questions arise.

## 2019-01-09 NOTE — Progress Notes (Signed)
Virtual Visit via Video   This visit type was conducted due to national recommendations for restrictions regarding the COVID-19 Pandemic (e.g. social distancing) in an effort to limit this patient's exposure and mitigate transmission in our community.  Due to her co-morbid illnesses, this patient is at least at moderate risk for complications without adequate follow up.  This format is felt to be most appropriate for this patient at this time.  All issues noted in this document were discussed and addressed.  A limited physical exam was performed with this format.    This visit type was conducted due to national recommendations for restrictions regarding the COVID-19 Pandemic (e.g. social distancing) in an effort to limit this patient's exposure and mitigate transmission in our community.  Patients identity confirmed using two different identifiers.  This format is felt to be most appropriate for this patient at this time.  All issues noted in this document were discussed and addressed.  No physical exam was performed (except for noted visual exam findings with Video Visits).    Date:  01/09/2019   ID:  Evelyn Wade, DOB 1970-08-18, MRN [id number]  Patient Location:  Home  Provider location:   Office    Chief Complaint:  "I have a sore throat"  History of Present Illness:    Evelyn Wade is a 48 y.o. female who presents via video conferencing for a telehealth visit today.    The patient does not have symptoms concerning for COVID-19 infection (fever, chills, cough, or new shortness of breath).   She presents today for virtual visit. She prefers this method of contact due to COVID-19 pandemic.  She presents for further evaluation of sore throat and neck pain. 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 She contacted her ENT who advised her to call PCP. She has upcoming ENT  appt on 12/11. Since her initial phone call to me, the ENT has contacted her and called in rx. She is not sure what it is. She has left sided throat pain and left-sided neck pain. She denies fever/chills. Again, she is concerned b/c past experience. Pain is so bad, she was unable to sleep last night. Described as a dull,throbbing pain.     Past Medical History:  Diagnosis Date  . Diabetes mellitus without complication ([place])   . Hypertension   . Pneumonia    hx  . Sleep apnea    cpap 4-5 yrs   Past Surgical History:  Procedure Laterality Date  . BARIATRIC SURGERY    . CESAREAN SECTION N/A 08/17/2014   Procedure: CESAREAN SECTION;  Surgeon: Everett Graff, MD;  Location: [facility];  Service: Obstetrics;  Laterality: N/A;  . KNEE ARTHROSCOPY Left   . SUBMANDIBULAR GLAND EXCISION Left 09/07/2012   Procedure: EXCISION SUBMANDIBULAR GLAND;  Surgeon: Ascencion Dike, MD;  Location: [facility];  Service: ENT;  Laterality: Left;  Excision Submandibular Gland  . TONSILLECTOMY       No outpatient medications have been marked as taking for the 01/09/19 encounter (Telemedicine) with Glendale Chard, MD.     Allergies:   Patient has no known allergies.   Social History   Tobacco Use  . Smoking status: Never Smoker  . Smokeless tobacco: Never Used  Substance Use Topics  . Alcohol use: No  . Drug use: No     Family Hx: The patient's family history  includes Diabetes in her father; Heart attack in her father; Hypertension in her brother; Ovarian cancer in her mother.  ROS:   Please see the history of present illness.    Review of Systems  Constitutional: Negative.   HENT: Positive for sore throat.   Respiratory: Negative.  Negative for cough and shortness of breath.   Cardiovascular: Negative.   Gastrointestinal: Negative.   Musculoskeletal: Positive for neck pain.  Neurological: Negative.   Psychiatric/Behavioral: Negative.     All other systems reviewed and are negative.   Labs/Other Tests and  Data Reviewed:    Recent Labs: 07/27/2018: ALT 13; Hemoglobin 10.9; Platelets 522 11/28/2018: BUN 22; Creatinine, Ser 0.80; Potassium 4.3; Sodium 139   Recent Lipid Panel Lab Results  Component Value Date/Time   CHOL 162 07/27/2018 09:55 AM   TRIG 45 07/27/2018 09:55 AM   HDL 93 07/27/2018 09:55 AM   CHOLHDL 1.7 07/27/2018 09:55 AM   LDLCALC 60 07/27/2018 09:55 AM    Wt Readings from Last 3 Encounters:  11/28/18 (!) 326 lb 9.6 oz (148.1 kg)  11/08/18 (!) 334 lb (151.5 kg)  10/26/18 (!) 341 lb 3.2 oz (154.8 kg)     Exam:    Vital Signs:  Ht 5' (1.524 m) Comment: unable to obtain  BMI 63.78 kg/m     Physical Exam  Constitutional: She is oriented to person, place, and time and well-developed, well-nourished, and in no distress.  HENT:  Head: Normocephalic and atraumatic.  Appears to be left neck/jaw swelling. No overlying erythema noted.   Neck: Normal range of motion.  Pulmonary/Chest: Effort normal.  Neurological: She is alert and oriented to person, place, and time.  Psychiatric: Affect normal.  Nursing note and vitals reviewed.   ASSESSMENT & PLAN:     1. Pharyngitis, unspecified etiology  I planned to call in rx Augmentin; however, she states ENT has already called in rx Clindamycin w/ tid dosing x 7 days. She is encouraged to take full abx course.   2. Neck pain  Possibly due to submandibular swelling. Attempted to run PDMP; however, system does not appear to be working. Rx for tramadol prn was sent to the pharmacy. She is encouraged to keep upcoming appt 12/11 with ENT.     COVID-19 Education: The signs and symptoms of COVID-19 were discussed with the patient and how to seek care for testing (follow up with PCP or arrange E-visit).  The importance of social distancing was discussed today.  Patient Risk:   After full review of this patients clinical status, I feel that they are at least moderate risk at this time.      Medication Adjustments/Labs and  Tests Ordered: Current medicines are reviewed at length with the patient today.  Concerns regarding medicines are outlined above.   Tests Ordered: No orders of the defined types were placed in this encounter.   Medication Changes: Meds ordered this encounter  Medications  . traMADol (ULTRAM) 50 MG tablet    Sig: Take 1 tablet (50 mg total) by mouth every 6 (six) hours as needed.    Dispense:  20 tablet    Refill:  0    Disposition:  Follow up prn  Signed, Maximino Greenland, MD

## 2019-01-09 NOTE — Patient Instructions (Signed)
Pharyngitis  Pharyngitis is a sore throat (pharynx). This is when there is redness, pain, and swelling in your throat. Most of the time, this condition gets better on its own. In some cases, you may need medicine. Follow these instructions at home:  Take over-the-counter and prescription medicines only as told by your doctor. ? If you were prescribed an antibiotic medicine, take it as told by your doctor. Do not stop taking the antibiotic even if you start to feel better. ? Do not give children aspirin. Aspirin has been linked to Reye syndrome.  Drink enough water and fluids to keep your pee (urine) clear or pale yellow.  Get a lot of rest.  Rinse your mouth (gargle) with a salt-water mixture 3-4 times a day or as needed. To make a salt-water mixture, completely dissolve -1 tsp of salt in 1 cup of warm water.  If your doctor approves, you may use throat lozenges or sprays to soothe your throat. Contact a doctor if:  You have large, tender lumps in your neck.  You have a rash.  You cough up green, yellow-brown, or bloody spit. Get help right away if:  You have a stiff neck.  You drool or cannot swallow liquids.  You cannot drink or take medicines without throwing up.  You have very bad pain that does not go away with medicine.  You have problems breathing, and it is not from a stuffy nose.  You have new pain and swelling in your knees, ankles, wrists, or elbows. Summary  Pharyngitis is a sore throat (pharynx). This is when there is redness, pain, and swelling in your throat.  If you were prescribed an antibiotic medicine, take it as told by your doctor. Do not stop taking the antibiotic even if you start to feel better.  Most of the time, pharyngitis gets better on its own. Sometimes, you may need medicine. This information is not intended to replace advice given to you by your health care provider. Make sure you discuss any questions you have with your health care  provider. Document Released: 07/15/2007 Document Revised: 01/08/2017 Document Reviewed: 03/03/2016 Elsevier Patient Education  2020 [employer].

## 2019-01-09 NOTE — Telephone Encounter (Signed)
-----   Message from Larey Seat, MD sent at 01/09/2019  1:48 PM EST ----- The patient took bathroom breaks.  Snoring was noted. EKG was in keeping with normal sinus rhythm  (NSR).   IMPRESSION:   1. Severe Obstructive Sleep Apnea (OSA) at AHI of 33.6/h and REM  sleep accentuated to AHI of 59/h.   RECOMMENDATIONS:   1. Advise full-night, attended, CPAP titration study to optimize  therapy. Please evaluate for the best tolerated interface.

## 2019-01-09 NOTE — Procedures (Signed)
PATIENT'S NAME:  Evelyn Wade, Evelyn Wade DOB:      Jan 30, 1971      MR#:    [id number]     DATE OF RECORDING: 01/01/2019 REFERRING M.D.:  Glendale Chard MD Study Performed:   Baseline Polysomnogram HISTORY:  Evelyn Wade is a 48 year- old year old African American female patient seen upon a referral on 11/08/2018 from Dr Glendale Chard, MD.    Chief concern according to patient: The patient reported frequent headaches and her PCP wanted her to evaluate possible sleep apnea. She was diagnosed in a PSG about 8-10 years ago and used CPAP but quit after a year.    Evelyn Wade is a right -handed 69 or African American female with a medical history of Diabetes mellitus without complication ([place]), Hypertension, Pneumonia, and Sleep apnea, Morbid obesity, Nocturia, and Tonsillectomy at age 63. She has a brother with OSA.   The patient endorsed the Epworth Sleepiness Scale at 7/24 points.   The patient's weight 334 pounds with a height of 60 (inches), resulting in a BMI of 65.8 kg/m2. The patient's neck circumference measured 17 inches.  CURRENT MEDICATIONS: Ventolin, Hydrocodone, Advil, Norel, Benicar, Ozempic.   PROCEDURE:  This is a multichannel digital polysomnogram utilizing the Somnostar 11.2 system.  Electrodes and sensors were applied and monitored per AASM Specifications.   EEG, EOG, Chin and Limb EMG, were sampled at 200 Hz.  ECG, Snore and Nasal Pressure, Thermal Airflow, Respiratory Effort, CPAP Flow and Pressure, Oximetry was sampled at 50 Hz. Digital video and audio were recorded.      BASELINE STUDY: Lights Out was at 22:15 and Lights On at 05:01.  Total recording time (TRT) was 406 minutes, with a total sleep time (TST) of 378.5 minutes.   The patient's sleep latency was 13 minutes.  REM latency was 244 minutes.  The sleep efficiency was 93.2 %.     SLEEP ARCHITECTURE: WASO (Wake after sleep onset) was 14 minutes.  There were 4.5 minutes in Stage N1, 261.5 minutes Stage N2, 76 minutes Stage  N3 and 36.5 minutes in Stage REM.  The percentage of Stage N1 was 1.2%, Stage N2 was 69.1%, Stage N3 was 20.1% and Stage R (REM sleep) was 9.6%.   RESPIRATORY ANALYSIS:  There were a total of 212 respiratory events:  7 obstructive apneas, 0 central apneas 205 hypopneas.      The total APNEA/HYPOPNEA INDEX (AHI) was 33.6/h.  36 events occurred in REM sleep and 340 events in NREM. The REM AHI was 59.2 /hour, versus a non-REM AHI of 30.9/h. The patient spent 163.5 minutes of total sleep time in the supine position and 215 minutes in non-supine. The supine AHI was 37.1 versus a non-supine AHI of 31.0.  OXYGEN SATURATION & C02:  The Wake baseline 02 saturation was 96%, with the lowest being 70%. Time spent below 89% saturation equaled 89 minutes.    PERIODIC LIMB MOVEMENTS:  The patient had a total of 0 Periodic Limb Movements.   The arousals were noted as: 29 were spontaneous, 0 were associated with PLMs, 15 were associated with respiratory events.   The patient took bathroom breaks. Snoring was noted. EKG was in keeping with normal sinus rhythm (NSR).  IMPRESSION:  1. Severe Obstructive Sleep Apnea (OSA) at AHI of 33.6/h and REM sleep accentuated to AHI of 59/h.   RECOMMENDATIONS:  1. Advise full-night, attended, CPAP titration study to optimize therapy. Please evaluate for the best tolerated interface.    I  certify that I have reviewed the entire raw data recording prior to the issuance of this report in accordance with the Standards of Accreditation of the American Academy of Sleep Medicine (AASM)   Larey Seat, MD Diplomat, American Board of Psychiatry and Neurology  Diplomat, American Board of Sleep Medicine Market researcher, [facility] [employer]

## 2019-01-09 NOTE — Addendum Note (Signed)
Addended by: Larey Seat on: 01/09/2019 01:48 PM   Modules accepted: Orders

## 2019-01-16 ENCOUNTER — Encounter: Payer: Self-pay | Admitting: Internal Medicine

## 2019-01-16 ENCOUNTER — Other Ambulatory Visit: Payer: Self-pay | Admitting: Internal Medicine

## 2019-01-16 MED ORDER — HYDROCODONE-ACETAMINOPHEN 5-325 MG PO TABS: 1.0000 | ORAL_TABLET | Freq: Four times a day (QID) | ORAL | 0 refills | Status: DC | PRN

## 2019-01-27 ENCOUNTER — Other Ambulatory Visit: Payer: Self-pay | Admitting: Internal Medicine

## 2019-02-07 ENCOUNTER — Encounter: Payer: Self-pay | Admitting: Internal Medicine

## 2019-02-07 LAB — HM DIABETES EYE EXAM

## 2019-02-16 ENCOUNTER — Other Ambulatory Visit (HOSPITAL_COMMUNITY)
Admission: RE | Admit: 2019-02-16 | Discharge: 2019-02-16 | Disposition: A | Discharge status: Home / Self Care | Payer: BC Managed Care – PPO | Source: Ambulatory Visit | Attending: Neurology | Admitting: Neurology

## 2019-02-16 ENCOUNTER — Ambulatory Visit: Payer: BC Managed Care – PPO | Admitting: Family Medicine

## 2019-02-16 DIAGNOSIS — Z20822 Contact with and (suspected) exposure to covid-19: Secondary | ICD-10-CM | POA: Diagnosis not present

## 2019-02-16 DIAGNOSIS — Z01812 Encounter for preprocedural laboratory examination: Secondary | ICD-10-CM | POA: Insufficient documentation

## 2019-02-16 LAB — SARS CORONAVIRUS 2 (TAT 6-24 HRS): SARS Coronavirus 2: NEGATIVE

## 2019-02-19 ENCOUNTER — Ambulatory Visit (INDEPENDENT_AMBULATORY_CARE_PROVIDER_SITE_OTHER): Payer: BC Managed Care – PPO | Admitting: Neurology

## 2019-02-19 DIAGNOSIS — E669 Obesity, unspecified: Secondary | ICD-10-CM

## 2019-02-19 DIAGNOSIS — I10 Essential (primary) hypertension: Secondary | ICD-10-CM

## 2019-02-19 DIAGNOSIS — G4733 Obstructive sleep apnea (adult) (pediatric): Secondary | ICD-10-CM | POA: Diagnosis not present

## 2019-02-19 DIAGNOSIS — D509 Iron deficiency anemia, unspecified: Secondary | ICD-10-CM

## 2019-02-19 DIAGNOSIS — R0683 Snoring: Secondary | ICD-10-CM

## 2019-02-19 DIAGNOSIS — E662 Morbid (severe) obesity with alveolar hypoventilation: Secondary | ICD-10-CM

## 2019-02-24 ENCOUNTER — Ambulatory Visit: Payer: BC Managed Care – PPO | Attending: Internal Medicine

## 2019-02-24 DIAGNOSIS — Z20822 Contact with and (suspected) exposure to covid-19: Secondary | ICD-10-CM

## 2019-02-25 LAB — NOVEL CORONAVIRUS, NAA: SARS-CoV-2, NAA: NOT DETECTED

## 2019-02-28 ENCOUNTER — Ambulatory Visit (INDEPENDENT_AMBULATORY_CARE_PROVIDER_SITE_OTHER): Payer: BC Managed Care – PPO | Admitting: Internal Medicine

## 2019-02-28 ENCOUNTER — Encounter: Payer: Self-pay | Admitting: Internal Medicine

## 2019-02-28 ENCOUNTER — Other Ambulatory Visit: Payer: Self-pay | Admitting: Nurse Practitioner

## 2019-02-28 ENCOUNTER — Other Ambulatory Visit: Payer: Self-pay

## 2019-02-28 VITALS — BP 110/74 | HR 62 | Temp 98.5°F | Ht 60.0 in | Wt 319.8 lb

## 2019-02-28 DIAGNOSIS — Z20822 Contact with and (suspected) exposure to covid-19: Secondary | ICD-10-CM | POA: Diagnosis not present

## 2019-02-28 DIAGNOSIS — I10 Essential (primary) hypertension: Secondary | ICD-10-CM | POA: Diagnosis not present

## 2019-02-28 DIAGNOSIS — Z712 Person consulting for explanation of examination or test findings: Secondary | ICD-10-CM

## 2019-02-28 DIAGNOSIS — Z6841 Body Mass Index (BMI) 40.0 and over, adult: Secondary | ICD-10-CM

## 2019-02-28 NOTE — Progress Notes (Signed)
This visit occurred during the SARS-CoV-2 public health emergency.  Safety protocols were in place, including screening questions prior to the visit, additional usage of staff PPE, and extensive cleaning of exam room while observing appropriate contact time as indicated for disinfecting solutions.  Subjective:     Patient ID: Evelyn Wade , female    DOB: 1970-06-29 , 49 y.o.   MRN: [id number]   Chief Complaint  Patient presents with  . Hypertension    HPI  She is here today for BP check. She reports compliance with meds. She admits that she is not yet exercising on a regular basis.   Hypertension This is a chronic problem. The current episode started more than 1 year ago. The problem has been gradually improving since onset. The problem is controlled. Pertinent negatives include no blurred vision, chest pain, palpitations or shortness of breath. Past treatments include diuretics and angiotensin blockers. The current treatment provides moderate improvement. Compliance problems include exercise.      Past Medical History:  Diagnosis Date  . Diabetes mellitus without complication ([place])   . Hypertension   . Pneumonia    hx  . Sleep apnea    cpap 4-5 yrs     Family History  Problem Relation Age of Onset  . Ovarian cancer Mother   . Heart attack Father   . Diabetes Father   . Hypertension Brother      Current Outpatient Medications:  .  albuterol (VENTOLIN HFA) 108 (90 Base) MCG/ACT inhaler, Inhale 2 puffs every 4 to 8 hours as needed, Disp: 3 Inhaler, Rfl: 3 .  glucose blood test strip, Use as instructed, Disp: 50 each, Rfl: 12 .  HYDROcodone-acetaminophen (NORCO/VICODIN) 5-325 MG tablet, Take 1 tablet by mouth every 6 (six) hours as needed for moderate pain., Disp: 20 tablet, Rfl: 0 .  HYDROcodone-homatropine (HYDROMET) 5-1.5 MG/5ML syrup, Take 5 mLs by mouth every 6 (six) hours as needed for cough., Disp: 120 mL, Rfl: 0 .  ibuprofen (ADVIL,MOTRIN) 600 MG tablet, Take 1  tablet (600 mg total) by mouth every 6 (six) hours as needed., Disp: 30 tablet, Rfl: 2 .  Semaglutide,0.25 or 0.5MG /DOS, (OZEMPIC, 0.25 OR 0.5 MG/DOSE,) 2 MG/1.5ML SOPN, Inject 0.5 mg into the skin once a week., Disp: 1 pen, Rfl: 5 .  NOREL AD 4-10-325 MG TABS, TAKE 1 TABLET BY MOUTH TWICE A DAY (Patient not taking: Reported on 02/28/2019), Disp: 20 tablet, Rfl: 0 .  olmesartan-hydrochlorothiazide (BENICAR HCT) 20-12.5 MG tablet, TAKE 1 TABLET BY MOUTH EVERY DAY, Disp: 30 tablet, Rfl: 1 .  traMADol (ULTRAM) 50 MG tablet, Take 1 tablet (50 mg total) by mouth every 6 (six) hours as needed. (Patient not taking: Reported on 02/28/2019), Disp: 20 tablet, Rfl: 0   No Known Allergies   Review of Systems  Constitutional: Negative.   Eyes: Negative for blurred vision.  Respiratory: Negative.  Negative for shortness of breath.        She reports she was exposed to COVID through her child's daycare. She is not experiencing any sx. Her son and husband are without symptoms.   Cardiovascular: Negative.  Negative for chest pain and palpitations.  Gastrointestinal: Negative.   Neurological: Negative.   Psychiatric/Behavioral: Negative.      Today's Vitals   02/28/19 1547  BP: 110/74  Pulse: 62  Temp: 98.5 F (36.9 C)  TempSrc: Oral  Weight: (!) 319 lb 12.8 oz (145.1 kg)  Height: 5' (1.524 m)   Body mass index is 62.46  kg/m.   Objective:  Physical Exam Vitals and nursing note reviewed.  Constitutional:      Appearance: Normal appearance. She is obese.  HENT:     Head: Normocephalic and atraumatic.  Cardiovascular:     Rate and Rhythm: Normal rate and regular rhythm.     Heart sounds: Normal heart sounds.  Pulmonary:     Effort: Pulmonary effort is normal.     Breath sounds: Normal breath sounds.  Skin:    General: Skin is warm.  Neurological:     General: No focal deficit present.     Mental Status: She is alert.  Psychiatric:        Mood and Affect: Mood normal.        Behavior:  Behavior normal.         Assessment And Plan:     1. Essential hypertension, benign  Chronic, well controlled. Previous renal function labs reviewed - wnl. She will rto in June 2021 for her next physical examination.   2. Class 3 severe obesity due to excess calories with serious comorbidity and body mass index (BMI) of 60.0 to 69.9 in adult [facility])  Wt Readings from Last 3 Encounters:  02/28/19 (!) 319 lb 12.8 oz (145.1 kg)  11/28/18 (!) 326 lb 9.6 oz (148.1 kg)  11/08/18 (!) 334 lb (151.5 kg)   Chronic.  She was congratulated on another 7 pound weight loss since October 2020. She is encouraged to strive to get under 300 pounds to decrease cardiac risk. She is encouraged to aim for at least 150 minutes of exercise per week.   3. Exposure to COVID-19 virus  She had neg COVID test on 1/15. She is encouraged to monitor her temperature and let me know if she develops any symptoms. She is encouraged to continue to practice the 3 W's.        Maximino Greenland, MD    THE PATIENT IS ENCOURAGED TO PRACTICE SOCIAL DISTANCING DUE TO THE COVID-19 PANDEMIC.

## 2019-02-28 NOTE — Patient Instructions (Signed)
COVID-19 Vaccine Information can be found at: [email or web address].uk For questions related to vaccine distribution or appointments, please email [email or web address] or call [phone].     COVID-19: How to Protect Yourself and Others Know how it spreads  There is currently no vaccine to prevent coronavirus disease 2019 (COVID-19).  The best way to prevent illness is to avoid being exposed to this virus.  The virus is thought to spread mainly from person-to-person. ? Between people who are in close contact with one another (within about 6 feet). ? Through respiratory droplets produced when an infected person coughs, sneezes or talks. ? These droplets can land in the mouths or noses of people who are nearby or possibly be inhaled into the lungs. ? COVID-19 may be spread by people who are not showing symptoms. Everyone should Clean your hands often  Wash your hands often with soap and water for at least 20 seconds especially after you have been in a public place, or after blowing your nose, coughing, or sneezing.  If soap and water are not readily available, use a hand sanitizer that contains at least 60% alcohol. Cover all surfaces of your hands and rub them together until they feel dry.  Avoid touching your eyes, nose, and mouth with unwashed hands. Avoid close contact  Limit contact with others as much as possible.  Avoid close contact with people who are sick.  Put distance between yourself and other people. ? Remember that some people without symptoms may be able to spread virus. ? This is especially important for people who are at higher risk of getting very [employer]cy Cover your mouth and nose with a mask when around others  You could spread COVID-19 to others even if you do not feel sick.  Everyone should wear a mask in public settings and  when around people not living in their household, especially when social distancing is difficult to maintain. ? Masks should not be placed on young children under age 68, anyone who has trouble breathing, or is unconscious, incapacitated or otherwise unable to remove the mask without assistance.  The mask is meant to protect other people in case you are infected.  Do NOT use a facemask meant for a Dietitian.  Continue to keep about 6 feet between yourself and others. The mask is not a substitute for social distancing. Cover coughs and sneezes  Always cover your mouth and nose with a tissue when you cough or sneeze or use the inside of your elbow.  Throw used tissues in the trash.  Immediately wash your hands with soap and water for at least 20 seconds. If soap and water are not readily available, clean your hands with a hand sanitizer that contains at least 60% alcohol. Clean and disinfect  Clean AND disinfect frequently touched surfaces daily. This includes tables, doorknobs, light switches, countertops, handles, desks, phones, keyboards, toilets, faucets, and sinks. RackRewards[employer]fr  If surfaces are dirty, clean them: Use detergent or soap and water prior to disinfection.  Then, use a household disinfectant. You can see a list of EPA-registered household disinfectants here. michellinders.com 10/12/2018 This information is not intended to replace advice given to you by your health care provider. Make sure you discuss any questions you have with your health care provider. Document Revised: 10/20/2018 Document Reviewed: 08/18/2018 Elsevier Patient Education  [place].

## 2019-03-05 NOTE — Procedures (Signed)
PATIENT'S NAME:  Evelyn Wade, Evelyn Wade DOB:      1970-08-20      MR#:    [id number]     DATE OF RECORDING: 02/19/2019 [facility] -  REFERRING M.D.:  Glendale Chard, MD Study Performed:   Titration to Positive Airway Pressure  HISTORY:  Mrs. Hamade returned after her sleep study from 01/01/2019 documented severe Obstructive Sleep Apnea (OSA) with an AHI of 33.6, REM AHI of 59.2, supine AH of 37.1 and an oxygen nadir of 70%.  Advised to perform a full-night, attended, CPAP titration study to optimize therapy and evaluate for the best tolerated interface. The chief compliant of this patient had been headaches.   The patient endorsed the Epworth Sleepiness Scale at 7/24 points and the Fatigue Score at 22 points.   The patient's weight 334 pounds with a height of 60 (inches), resulting in a BMI of 65.8 kg/m2. The patient's neck circumference measured 17 inches.  CURRENT MEDICATIONS: Ventolin, Hydromet, Advil, Benicar, Ozempic.   PROCEDURE:  This is a multichannel digital polysomnogram utilizing the SomnoStar 11.2 system.  Electrodes and sensors were applied and monitored per AASM Specifications.   EEG, EOG, Chin and Limb EMG, were sampled at 200 Hz.  ECG, Snore and Nasal Pressure, Thermal Airflow, Respiratory Effort, CPAP Flow and Pressure, Oximetry was sampled at 50 Hz. Digital video and audio were recorded.      The patient was fitted with a F&P Simplus small full-face mask as best tolerated. CPAP was initiated at 5 cmH20 with heated humidity per AASM split night standards and pressure was advanced to 7cmH20 because of hypopneas, apneas and desaturations.  At a PAP pressure of 7 cmH20, there was a reduction of the AHI to 0.3 with improvement of sleep apnea.  Lights Out was at 21:08 and Lights On at 04:56. Total recording time (TRT) was 468.5 minutes, with a total sleep time (TST) of 434 minutes. The patient's sleep latency was 19 minutes. REM latency was 280.5 minutes. The sleep efficiency was 92.6 %.    SLEEP  ARCHITECTURE: WASO (Wake after sleep onset) was at 23 minutes. There were 4.5 minutes in Stage N1, 309.5 minutes Stage N2, 84 minutes Stage N3 and 36 minutes in Stage REM.  The percentage of Stage N1 was 1.%, Stage N2 was 71.3%, Stage N3 was 19.4% and Stage R (REM sleep) was 8.3%.      RESPIRATORY ANALYSIS:  There was a total of 14 respiratory events: 0 obstructive apneas, 0 central apneas and 0 mixed apneas with a total of 0 apneas and an apnea index (AI) of 0 /hour. There were 14 hypopneas with a hypopnea index of 1.9/hour. The patient also had 0 respiratory event related arousals (RERAs).      The total APNEA/HYPOPNEA INDEX  (AHI) was 1.9 /hour and the total RESPIRATORY DISTURBANCE INDEX was 1.9 /hour  1 events occurred in REM sleep and 13 events in NREM. The REM AHI was 1.7 /hour versus a non-REM AHI of 2. /hour.  The patient spent 434 minutes of total sleep time in the supine position and 0 minutes in non-supine. The supine AHI was 1.9, versus a non-supine AHI of 0.0.  OXYGEN SATURATION & C02:  The baseline 02 saturation was 98%, with the lowest being 91%. Time spent below 89% saturation equaled 0 minutes. The arousals were noted as: 20 were spontaneous, 0 were associated with PLMs, 2 were associated with respiratory events. The patient had a total of 0 Periodic Limb Movements.  Audio and  video analysis did not show any abnormal or unusual movements, behaviors, phonations or vocalizations. EKG was in keeping with normal sinus rhythm (NSR). The patient was fitted with a F&P Simplus small full-face mask.  DIAGNOSIS 1. Obstructive Sleep Apnea was remarkably responsive to very low CPAP pressures of 7 cm water    PLANS/RECOMMENDATIONS: The patient will be started on CPAP with an autotitration capable device, heated humidity and mask of choice. The patient was fitted with a F&P Simplus small full-face mask. I will set the CPAP from 5-10 cm water, with 1 cm EPR.    A follow up appointment will be  scheduled in the Sleep Clinic at [facility].   Please call [phone] with any questions.      I certify that I have reviewed the entire raw data recording prior to the issuance of this report in accordance with the Standards of Accreditation of the American Academy of Sleep Medicine (AASM)    Larey Seat, M.D. Diplomat, Tax adviser of Psychiatry and Neurology  Diplomat, Tax adviser of Sleep Medicine Market researcher, [employer] [facility] at [employer]

## 2019-03-05 NOTE — Addendum Note (Signed)
Addended by: Larey Seat on: 03/05/2019 04:46 PM   Modules accepted: Orders

## 2019-03-06 ENCOUNTER — Telehealth: Payer: Self-pay | Admitting: Neurology

## 2019-03-06 NOTE — Telephone Encounter (Signed)
-----   Message from Larey Seat, MD sent at 03/05/2019  4:46 PM EST ----- DIAGNOSIS  1. Obstructive Sleep Apnea was remarkably responsive to very low  CPAP pressures of 7 cm water    PLANS/RECOMMENDATIONS: The patient will be started on CPAP with  an autotitration capable device, heated humidity and mask of  choice. The patient was fitted with a F&P Simplus small full-face  mask.  I will set the CPAP from 5-10 cm water, with 1 cm EPR.

## 2019-03-06 NOTE — Telephone Encounter (Signed)
I called pt. I advised pt that Dr. Brett Fairy reviewed their sleep study results and found that pt was best tolerated at a CPAP pressure of 7 cm. Dr. Brett Fairy recommends that pt starts auto CPAP 5-10 cm water pressure. I reviewed PAP compliance expectations with the pt. Pt is agreeable to starting a CPAP. I advised pt that an order will be sent to a DME, [facility], and [facility] will call the pt within about one week after they file with the pt's insurance. [facility] will show the pt how to use the machine, fit for masks, and troubleshoot the CPAP if needed. A follow up appt was made for insurance purposes with Ward Givens, NP on May 11, 2019 at 9 am. Pt verbalized understanding to arrive 15 minutes early and bring their CPAP. A letter with all of this information in it will be mailed to the pt as a reminder. I verified with the pt that the address we have on file is correct. Pt verbalized understanding of results. Pt had no questions at this time but was encouraged to call back if questions arise. I have sent the order to [facility] and have received confirmation that they have received the order.

## 2019-03-30 NOTE — Telephone Encounter (Signed)
Received a notification from [place] in regards to the patient.  "02/03 called pt to go over financials and scheduling, no answer lvm, 02/03 pt returned my call and said her insurance should be covering her machine at 100% and wanted to call them then call me back. 02/10 called to follow up no answer lvm. 02/12 called to follow up no answer lvm. I am voiding this order, just wanted to make you all aware."  Pt will have to contact [facility] to get set up.

## 2019-03-31 ENCOUNTER — Other Ambulatory Visit: Payer: Self-pay | Admitting: Nurse Practitioner

## 2019-04-12 ENCOUNTER — Other Ambulatory Visit: Payer: Self-pay | Admitting: Otolaryngology

## 2019-04-12 DIAGNOSIS — R221 Localized swelling, mass and lump, neck: Secondary | ICD-10-CM

## 2019-04-19 ENCOUNTER — Other Ambulatory Visit: Payer: Self-pay | Admitting: Obstetrics and Gynecology

## 2019-05-11 ENCOUNTER — Ambulatory Visit: Payer: Self-pay | Admitting: Adult Health

## 2019-08-02 ENCOUNTER — Encounter: Payer: Self-pay | Admitting: Internal Medicine

## 2019-08-02 ENCOUNTER — Ambulatory Visit (INDEPENDENT_AMBULATORY_CARE_PROVIDER_SITE_OTHER): Payer: BC Managed Care – PPO | Admitting: Internal Medicine

## 2019-08-02 ENCOUNTER — Other Ambulatory Visit: Payer: Self-pay

## 2019-08-02 VITALS — BP 114/76 | HR 87 | Temp 98.1°F | Ht 60.2 in | Wt 331.0 lb

## 2019-08-02 DIAGNOSIS — E1169 Type 2 diabetes mellitus with other specified complication: Secondary | ICD-10-CM

## 2019-08-02 DIAGNOSIS — Z23 Encounter for immunization: Secondary | ICD-10-CM | POA: Diagnosis not present

## 2019-08-02 DIAGNOSIS — Z6841 Body Mass Index (BMI) 40.0 and over, adult: Secondary | ICD-10-CM

## 2019-08-02 DIAGNOSIS — Z0000 Encounter for general adult medical examination without abnormal findings: Secondary | ICD-10-CM | POA: Diagnosis not present

## 2019-08-02 DIAGNOSIS — I10 Essential (primary) hypertension: Secondary | ICD-10-CM | POA: Diagnosis not present

## 2019-08-02 LAB — POCT URINALYSIS DIPSTICK
Bilirubin, UA: NEGATIVE
Blood, UA: NEGATIVE
Glucose, UA: NEGATIVE
Ketones, UA: NEGATIVE
Leukocytes, UA: NEGATIVE
Nitrite, UA: NEGATIVE
Protein, UA: NEGATIVE
Spec Grav, UA: 1.02 (ref 1.010–1.025)
Urobilinogen, UA: 0.2 E.U./dL
pH, UA: 6.5 (ref 5.0–8.0)

## 2019-08-02 LAB — POCT UA - MICROALBUMIN
Albumin/Creatinine Ratio, Urine, POC: <30
Creatinine, POC: 100 mg/dL
Microalbumin Ur, POC: 10 mg/L

## 2019-08-02 NOTE — Patient Instructions (Signed)
Health Maintenance, Female Adopting a healthy lifestyle and getting preventive care are important in promoting health and wellness. Ask your health care provider about:  The right schedule for you to have regular tests and exams.  Things you can do on your own to prevent diseases and keep yourself healthy. What should I know about diet, weight, and exercise? Eat a healthy diet   Eat a diet that includes plenty of vegetables, fruits, low-fat dairy products, and lean protein.  Do not eat a lot of foods that are high in solid fats, added sugars, or sodium. Maintain a healthy weight Body mass index (BMI) is used to identify weight problems. It estimates body fat based on height and weight. Your health care provider can help determine your BMI and help you achieve or maintain a healthy weight. Get regular exercise Get regular exercise. This is one of the most important things you can do for your health. Most adults should:  Exercise for at least 150 minutes each week. The exercise should increase your heart rate and make you sweat (moderate-intensity exercise).  Do strengthening exercises at least twice a week. This is in addition to the moderate-intensity exercise.  Spend less time sitting. Even light physical activity can be beneficial. Watch cholesterol and blood lipids Have your blood tested for lipids and cholesterol at 47 years of age, then have this test every 5 years. Have your cholesterol levels checked more often if:  Your lipid or cholesterol levels are high.  You are older than 47 years of age.  You are at high risk for heart disease. What should I know about cancer screening? Depending on your health history and family history, you may need to have cancer screening at various ages. This may include screening for:  Breast cancer.  Cervical cancer.  Colorectal cancer.  Skin cancer.  Lung cancer. What should I know about heart disease, diabetes, and high blood  pressure? Blood pressure and heart disease  High blood pressure causes heart disease and increases the risk of stroke. This is more likely to develop in people who have high blood pressure readings, are of African descent, or are overweight.  Have your blood pressure checked: ? Every 3-5 years if you are 18-39 years of age. ? Every year if you are 40 years old or older. Diabetes Have regular diabetes screenings. This checks your fasting blood sugar level. Have the screening done:  Once every three years after age 40 if you are at a normal weight and have a low risk for diabetes.  More often and at a younger age if you are overweight or have a high risk for diabetes. What should I know about preventing infection? Hepatitis B If you have a higher risk for hepatitis B, you should be screened for this virus. Talk with your health care provider to find out if you are at risk for hepatitis B infection. Hepatitis C Testing is recommended for:  Everyone born from 1945 through 1965.  Anyone with known risk factors for hepatitis C. Sexually transmitted infections (STIs)  Get screened for STIs, including gonorrhea and chlamydia, if: ? You are sexually active and are younger than 47 years of age. ? You are older than 47 years of age and your health care provider tells you that you are at risk for this type of infection. ? Your sexual activity has changed since you were last screened, and you are at increased risk for chlamydia or gonorrhea. Ask your health care provider if   you are at risk.  Ask your health care provider about whether you are at high risk for HIV. Your health care provider may recommend a prescription medicine to help prevent HIV infection. If you choose to take medicine to prevent HIV, you should first get tested for HIV. You should then be tested every 3 months for as long as you are taking the medicine. Pregnancy  If you are about to stop having your period (premenopausal) and  you may become pregnant, seek counseling before you get pregnant.  Take 400 to 800 micrograms (mcg) of folic acid every day if you become pregnant.  Ask for birth control (contraception) if you want to prevent pregnancy. Osteoporosis and menopause Osteoporosis is a disease in which the bones lose minerals and strength with aging. This can result in bone fractures. If you are 65 years old or older, or if you are at risk for osteoporosis and fractures, ask your health care provider if you should:  Be screened for bone loss.  Take a calcium or vitamin D supplement to lower your risk of fractures.  Be given hormone replacement therapy (HRT) to treat symptoms of menopause. Follow these instructions at home: Lifestyle  Do not use any products that contain nicotine or tobacco, such as cigarettes, e-cigarettes, and chewing tobacco. If you need help quitting, ask your health care provider.  Do not use street drugs.  Do not share needles.  Ask your health care provider for help if you need support or information about quitting drugs. Alcohol use  Do not drink alcohol if: ? Your health care provider tells you not to drink. ? You are pregnant, may be pregnant, or are planning to become pregnant.  If you drink alcohol: ? Limit how much you use to 0-1 drink a day. ? Limit intake if you are breastfeeding.  Be aware of how much alcohol is in your drink. In the [place] one drink equals one 12 oz bottle of beer (355 mL), one 5 oz glass of wine (148 mL), or one 1 oz glass of hard liquor (44 mL). General instructions  Schedule regular health, dental, and eye exams.  Stay current with your vaccines.  Tell your health care provider if: ? You often feel depressed. ? You have ever been abused or do not feel safe at home. Summary  Adopting a healthy lifestyle and getting preventive care are important in promoting health and wellness.  Follow your health care provider's instructions about healthy  diet, exercising, and getting tested or screened for diseases.  Follow your health care provider's instructions on monitoring your cholesterol and blood pressure. This information is not intended to replace advice given to you by your health care provider. Make sure you discuss any questions you have with your health care provider. Document Revised: 01/19/2018 Document Reviewed: 01/19/2018 Elsevier Patient Education  2020 [employer].  

## 2019-08-02 NOTE — Progress Notes (Signed)
This visit occurred during the SARS-CoV-2 public health emergency.  Safety protocols were in place, including screening questions prior to the visit, additional usage of staff PPE, and extensive cleaning of exam room while observing appropriate contact time as indicated for disinfecting solutions.  Subjective:     Patient ID: Evelyn Wade , female    DOB: [birth year] , 49 y.o.   MRN: [id number]   Chief Complaint  Patient presents with  . Annual Exam  . Hypertension  . Diabetes    HPI  She is here today for a full physical exam. She has been followed by Dr. Dion Body for her GYN care.  She admits she has not been checking her sugars, nor has she been taking the Ozempic.   Hypertension This is a chronic problem. The current episode started more than 1 year ago. The problem is controlled. Pertinent negatives include no blurred vision or chest pain. Risk factors for coronary artery disease include diabetes mellitus, dyslipidemia, obesity and sedentary lifestyle. Past treatments include ACE inhibitors, diuretics and calcium channel blockers. The current treatment provides moderate improvement.  Diabetes She presents for her follow-up diabetic visit. She has type 2 diabetes mellitus. Her disease course has been stable. There are no hypoglycemic associated symptoms. Pertinent negatives for diabetes include no blurred vision and no chest pain. There are no hypoglycemic complications. Symptoms are stable. Risk factors for coronary artery disease include diabetes mellitus, hypertension, obesity and sedentary lifestyle. Her weight is decreasing steadily. She is following a diabetic diet. She participates in exercise intermittently. Her breakfast blood glucose is taken between 7-8 am. Her breakfast blood glucose range is generally 90-110 mg/dl. An ACE inhibitor/angiotensin II receptor blocker is contraindicated. She does not see a podiatrist.    Past Medical History:  Diagnosis Date  . Diabetes  mellitus without complication (HCC)   . Hypertension   . Pneumonia    hx  . Sleep apnea    cpap 4-5 yrs     Family History  Problem Relation Age of Onset  . Ovarian cancer Mother   . Heart attack Father   . Diabetes Father   . Hypertension Brother      Current Outpatient Medications:  .  albuterol (VENTOLIN HFA) 108 (90 Base) MCG/ACT inhaler, Inhale 2 puffs every 4 to 8 hours as needed, Disp: 3 Inhaler, Rfl: 3 .  glucose blood test strip, Use as instructed, Disp: 50 each, Rfl: 12 .  HYDROcodone-acetaminophen (NORCO/VICODIN) 5-325 MG tablet, Take 1 tablet by mouth every 6 (six) hours as needed for moderate pain., Disp: 20 tablet, Rfl: 0 .  HYDROcodone-homatropine (HYDROMET) 5-1.5 MG/5ML syrup, Take 5 mLs by mouth every 6 (six) hours as needed for cough., Disp: 120 mL, Rfl: 0 .  ibuprofen (ADVIL,MOTRIN) 600 MG tablet, Take 1 tablet (600 mg total) by mouth every 6 (six) hours as needed., Disp: 30 tablet, Rfl: 2 .  olmesartan-hydrochlorothiazide (BENICAR HCT) 20-12.5 MG tablet, TAKE 1 TABLET BY MOUTH EVERY DAY, Disp: 90 tablet, Rfl: 1 .  NOREL AD 4-10-325 MG TABS, TAKE 1 TABLET BY MOUTH TWICE A DAY (Patient not taking: Reported on 02/28/2019), Disp: 20 tablet, Rfl: 0 .  traMADol (ULTRAM) 50 MG tablet, Take 1 tablet (50 mg total) by mouth every 6 (six) hours as needed. (Patient not taking: Reported on 02/28/2019), Disp: 20 tablet, Rfl: 0   No Known Allergies    The patient states she uses none for birth control. Last LMP was No LMP recorded.. Negative for DysmenorrheaNegative for: breast  discharge, breast lump(s), breast pain and breast self exam. Associated symptoms include abnormal vaginal bleeding. Pertinent negatives include abnormal bleeding (hematology), anxiety, decreased libido, depression, difficulty falling sleep, dyspareunia, history of infertility, nocturia, sexual dysfunction, sleep disturbances, urinary incontinence, urinary urgency, vaginal discharge and vaginal itching. Diet  regular.The patient states her exercise level is   intermittent.   . The patient's tobacco use is:  Social History   Tobacco Use  Smoking Status Never Smoker  Smokeless Tobacco Never Used  . She has been exposed to passive smoke. The patient's alcohol use is:  Social History   Substance and Sexual Activity  Alcohol Use No    Review of Systems  Constitutional: Negative.   HENT: Negative.   Eyes: Negative.  Negative for blurred vision.  Respiratory: Negative.   Cardiovascular: Negative.  Negative for chest pain.  Endocrine: Negative.   Genitourinary: Negative.   Musculoskeletal: Negative.   Skin: Negative.   Allergic/Immunologic: Negative.   Neurological: Negative.   Hematological: Negative.   Psychiatric/Behavioral: Negative.      Today's Vitals   08/02/19 0857  BP: 114/76  Pulse: 87  Temp: 98.1 F (36.7 C)  TempSrc: Oral  Weight: (!) 331 lb (150.1 kg)  Height: 5' 0.2" (1.529 m)   Body mass index is 64.22 kg/m.   Objective:  Physical Exam Vitals and nursing note reviewed.  Constitutional:      Appearance: Normal appearance. She is obese.  HENT:     Head: Normocephalic and atraumatic.     Right Ear: Tympanic membrane, ear canal and external ear normal.     Left Ear: Tympanic membrane, ear canal and external ear normal.     Nose:     Comments: Deferred, masked    Mouth/Throat:     Comments: Deferred, masked Eyes:     Extraocular Movements: Extraocular movements intact.     Conjunctiva/sclera: Conjunctivae normal.     Pupils: Pupils are equal, round, and reactive to light.  Cardiovascular:     Rate and Rhythm: Normal rate and regular rhythm.     Pulses: Normal pulses.          Dorsalis pedis pulses are 2+ on the right side and 2+ on the left side.     Heart sounds: Normal heart sounds.  Pulmonary:     Effort: Pulmonary effort is normal.     Breath sounds: Normal breath sounds.  Chest:     Breasts: Tanner Score is 5.        Right: Normal.        Left:  Normal.  Abdominal:     General: Bowel sounds are normal.     Palpations: Abdomen is soft.     Comments: Obese, soft, difficult to assess organomegaly due to body habitus  Genitourinary:    Comments: deferred Musculoskeletal:        General: Normal range of motion.     Cervical back: Normal range of motion and neck supple.  Feet:     Right foot:     Protective Sensation: 5 sites tested. 5 sites sensed.     Skin integrity: Skin integrity normal.     Toenail Condition: Right toenails are normal.     Left foot:     Protective Sensation: 5 sites tested. 5 sites sensed.     Skin integrity: Skin integrity normal.     Toenail Condition: Left toenails are normal.  Skin:    General: Skin is warm and dry.  Neurological:  General: No focal deficit present.     Mental Status: She is alert and oriented to person, place, and time.  Psychiatric:        Mood and Affect: Mood normal.        Behavior: Behavior normal.         Assessment And Plan:     1. Routine general medical examination at a health care facility  A full exam was performed. Importance of monthly self breast exams was discussed with the patient. PATIENT IS ADVISED TO GET 30-45 MINUTES REGULAR EXERCISE NO LESS THAN FOUR TO FIVE DAYS PER WEEK - BOTH WEIGHTBEARING EXERCISES AND AEROBIC ARE RECOMMENDED.  SHE IS ADVISED TO FOLLOW A HEALTHY DIET WITH AT LEAST SIX FRUITS/VEGGIES PER DAY, DECREASE INTAKE OF RED MEAT, AND TO INCREASE FISH INTAKE TO TWO DAYS PER WEEK.  MEATS/FISH SHOULD NOT BE FRIED, BAKED OR BROILED IS PREFERABLE.  I SUGGEST WEARING SPF 50 SUNSCREEN ON EXPOSED PARTS AND ESPECIALLY WHEN IN THE DIRECT SUNLIGHT FOR AN EXTENDED PERIOD OF TIME.  PLEASE AVOID FAST FOOD RESTAURANTS AND INCREASE YOUR WATER INTAKE.  - CMP14+EGFR - CBC - Lipid panel - Hemoglobin A1c - Hepatitis C antibody  2. Essential hypertension, benign  Chronic, well controlled. She will continue with current meds. She is encouraged to avoid adding  salt to her foods. EKG performed, NSR w/o any changes.   - EKG 12-Lead  3. Type 2 diabetes mellitus with other specified complication, without long-term current use of insulin (HCC)  Diabetic foot exam was performed.  I DISCUSSED WITH THE PATIENT AT LENGTH REGARDING THE GOALS OF GLYCEMIC CONTROL AND POSSIBLE LONG-TERM COMPLICATIONS.  I  ALSO STRESSED THE IMPORTANCE OF COMPLIANCE WITH HOME GLUCOSE MONITORING, DIETARY RESTRICTIONS INCLUDING AVOIDANCE OF SUGARY DRINKS/PROCESSED FOODS,  ALONG WITH REGULAR EXERCISE.  I  ALSO STRESSED THE IMPORTANCE OF ANNUAL EYE EXAMS, SELF FOOT CARE AND COMPLIANCE WITH OFFICE VISITS.  - POCT Urinalysis Dipstick (81002) - POCT UA - Microalbumin  4. Immunization due  She was given Pneumovax-23 IM x 1 to update her immunization history.   5. Class 3 severe obesity due to excess calories with serious comorbidity and body mass index (BMI) of 60.0 to 69.9 in adult [facility])  She is upset about her 12 pound weight gain since Jan 2021. We discussed the use of Wegovy for obesity. She was advised that this is the same compound as Ozempic. She denies family/personal h/o thyroid cancer. She was given 0.'25mg'$  samples and advised how to administer the medication. She self-administered today's dose. She understands that she will not take her next dose until Wed of next week. She is reminded to stop eating when full. She will rto in 4 weeks for re-evaluation. I plan to fax form to obtain savings code later today.    Maximino Greenland, MD    THE PATIENT IS ENCOURAGED TO PRACTICE SOCIAL DISTANCING DUE TO THE COVID-19 PANDEMIC.

## 2019-08-03 LAB — CMP14+EGFR
ALT: 11 IU/L (ref 0–32)
AST: 11 IU/L (ref 0–40)
Albumin/Globulin Ratio: 1.4 (ref 1.2–2.2)
Albumin: 3.8 g/dL (ref 3.8–4.8)
Alkaline Phosphatase: 66 IU/L (ref 48–121)
BUN/Creatinine Ratio: 25 — ABNORMAL HIGH (ref 9–23)
BUN: 17 mg/dL (ref 6–24)
Bilirubin Total: 0.3 mg/dL (ref 0.0–1.2)
CO2: 25 mmol/L (ref 20–29)
Calcium: 9.7 mg/dL (ref 8.7–10.2)
Chloride: 102 mmol/L (ref 96–106)
Creatinine, Ser: 0.67 mg/dL (ref 0.57–1.00)
GFR calc Af Amer: 122 mL/min/{1.73_m2} (ref >59)
GFR calc non Af Amer: 106 mL/min/{1.73_m2} (ref >59)
Globulin, Total: 2.7 g/dL (ref 1.5–4.5)
Glucose: 95 mg/dL (ref 65–99)
Potassium: 4.3 mmol/L (ref 3.5–5.2)
Sodium: 138 mmol/L (ref 134–144)
Total Protein: 6.5 g/dL (ref 6.0–8.5)

## 2019-08-03 LAB — LIPID PANEL
Chol/HDL Ratio: 2.1 ratio (ref 0.0–4.4)
Cholesterol, Total: 157 mg/dL (ref 100–199)
HDL: 76 mg/dL (ref >39)
LDL Chol Calc (NIH): 69 mg/dL (ref 0–99)
Triglycerides: 60 mg/dL (ref 0–149)
VLDL Cholesterol Cal: 12 mg/dL (ref 5–40)

## 2019-08-03 LAB — HEMOGLOBIN A1C
Est. average glucose Bld gHb Est-mCnc: 120 mg/dL
Hgb A1c MFr Bld: 5.8 % — ABNORMAL HIGH (ref 4.8–5.6)

## 2019-08-03 LAB — CBC
Hematocrit: 37.4 % (ref 34.0–46.6)
Hemoglobin: 11.7 g/dL (ref 11.1–15.9)
MCH: 24.1 pg — ABNORMAL LOW (ref 26.6–33.0)
MCHC: 31.3 g/dL — ABNORMAL LOW (ref 31.5–35.7)
MCV: 77 fL — ABNORMAL LOW (ref 79–97)
Platelets: 391 10*3/uL (ref 150–450)
RBC: 4.85 x10E6/uL (ref 3.77–5.28)
RDW: 15.3 % (ref 11.7–15.4)
WBC: 8.4 10*3/uL (ref 3.4–10.8)

## 2019-08-03 LAB — HEPATITIS C ANTIBODY: Hep C Virus Ab: <0.1 s/co ratio (ref 0.0–0.9)

## 2019-08-10 ENCOUNTER — Other Ambulatory Visit: Payer: Self-pay | Admitting: Internal Medicine

## 2019-08-10 ENCOUNTER — Encounter: Payer: Self-pay | Admitting: Internal Medicine

## 2019-08-18 ENCOUNTER — Telehealth: Payer: Self-pay

## 2019-08-18 NOTE — Telephone Encounter (Signed)
The patient was notified that [employer] has different savings cards for the [employer] and that one will be placed up front for pickup

## 2019-08-21 ENCOUNTER — Other Ambulatory Visit: Payer: BC Managed Care – PPO

## 2019-08-29 MED ORDER — WEGOVY 0.5 MG/0.5ML ~~LOC~~ SOAJ: 0.5000 mg | SUBCUTANEOUS | 4 refills | Status: DC

## 2019-08-30 ENCOUNTER — Other Ambulatory Visit: Payer: Self-pay

## 2019-08-30 ENCOUNTER — Ambulatory Visit: Payer: BC Managed Care – PPO | Admitting: Internal Medicine

## 2019-08-30 ENCOUNTER — Encounter: Payer: Self-pay | Admitting: Internal Medicine

## 2019-08-30 VITALS — BP 132/84 | HR 74 | Temp 98.5°F | Ht 60.2 in | Wt 327.0 lb

## 2019-08-30 DIAGNOSIS — I10 Essential (primary) hypertension: Secondary | ICD-10-CM

## 2019-08-30 DIAGNOSIS — Z6841 Body Mass Index (BMI) 40.0 and over, adult: Secondary | ICD-10-CM | POA: Diagnosis not present

## 2019-08-30 MED ORDER — WEGOVY 0.5 MG/0.5ML ~~LOC~~ SOAJ: 0.5000 mg | SUBCUTANEOUS | 4 refills | Status: DC

## 2019-08-30 NOTE — Progress Notes (Signed)
This visit occurred during the SARS-CoV-2 public health emergency.  Safety protocols were in place, including screening questions prior to the visit, additional usage of staff PPE, and extensive cleaning of exam room while observing appropriate contact time as indicated for disinfecting solutions.  Subjective:     Patient ID: Evelyn Wade , female    DOB: [birth year] , 49 y.o.   MRN: [id number]   Chief Complaint  Patient presents with  . Weight Check    HPI  She presents for f/u obesity.  She reports compliance with WEgovy. She feels well. Currently on 0.5mg  once weekly. She has yet to start exercising regularly.    Past Medical History:  Diagnosis Date  . Diabetes mellitus without complication ([place])   . Hypertension   . Pneumonia    hx  . Sleep apnea    cpap 4-5 yrs     Family History  Problem Relation Age of Onset  . Ovarian cancer Mother   . Heart attack Father   . Diabetes Father   . Hypertension Brother      Current Outpatient Medications:  .  albuterol (VENTOLIN HFA) 108 (90 Base) MCG/ACT inhaler, Inhale 2 puffs every 4 to 8 hours as needed, Disp: 3 Inhaler, Rfl: 3 .  glucose blood test strip, Use as instructed, Disp: 50 each, Rfl: 12 .  HYDROcodone-acetaminophen (NORCO/VICODIN) 5-325 MG tablet, Take 1 tablet by mouth every 6 (six) hours as needed for moderate pain., Disp: 20 tablet, Rfl: 0 .  HYDROcodone-homatropine (HYDROMET) 5-1.5 MG/5ML syrup, Take 5 mLs by mouth every 6 (six) hours as needed for cough., Disp: 120 mL, Rfl: 0 .  ibuprofen (ADVIL,MOTRIN) 600 MG tablet, Take 1 tablet (600 mg total) by mouth every 6 (six) hours as needed., Disp: 30 tablet, Rfl: 2 .  olmesartan-hydrochlorothiazide (BENICAR HCT) 20-12.5 MG tablet, TAKE 1 TABLET BY MOUTH EVERY DAY, Disp: 90 tablet, Rfl: 1 .  ferrous sulfate 325 (65 FE) MG EC tablet, ferrous sulfate 325 mg (65 mg iron) tablet,delayed release  Take 1 tablet every day by oral route after meals for 30 days., Disp: , Rfl:   .  Semaglutide-Weight Management (WEGOVY) 0.5 MG/0.5ML SOAJ, Inject 0.5 mg into the skin once a week., Disp: 2.4 mL, Rfl: 4   No Known Allergies   Review of Systems  Constitutional: Negative.  Negative for fatigue.  HENT: Negative.   Eyes: Negative.   Respiratory: Negative.   Cardiovascular: Negative.   Gastrointestinal: Negative.   Endocrine: Negative for polydipsia and polyuria.  Musculoskeletal: Negative.   Skin: Negative.   Neurological: Negative for dizziness and headaches.  Psychiatric/Behavioral: Negative.      Today's Vitals   08/30/19 1638  BP: 132/84  Pulse: 74  Temp: 98.5 F (36.9 C)  TempSrc: Oral  SpO2: 97%  Weight: (!) 327 lb (148.3 kg)  Height: 5' 0.2" (1.529 m)  PainSc: 0-No pain   Body mass index is 63.44 kg/m.   Objective:  Physical Exam Vitals and nursing note reviewed.  Constitutional:      Appearance: Normal appearance. She is obese.  HENT:     Head: Normocephalic and atraumatic.  Cardiovascular:     Rate and Rhythm: Normal rate and regular rhythm.     Heart sounds: Normal heart sounds.  Pulmonary:     Effort: Pulmonary effort is normal.     Breath sounds: Normal breath sounds.  Skin:    General: Skin is warm.  Neurological:     General: No focal deficit present.  Mental Status: She is alert.  Psychiatric:        Mood and Affect: Mood normal.        Behavior: Behavior normal.         Assessment And Plan:     1. Class 3 severe obesity due to excess calories with serious comorbidity and body mass index (BMI) of 60.0 to 69.9 in adult ([place]  BMI 63.  She is encouraged to initially strive for BMI less than 55 to decrease cardiac risk. Advised to aim for at least 150 minutes of exercise per week.  I plan to increase her Wegovy to 1mg  once weekly. She will RTO in four to six weeks for re-evaluation.   Wt Readings from Last 3 Encounters:  08/30/19 (!) 327 lb (148.3 kg)  08/02/19 (!) 331 lb (150.1 kg)  02/28/19 (!) 319 lb 12.8 oz  (145.1 kg)    2. Essential hypertension, benign  Chronic, fair control. Goal BP is less than 130/80. Again, importance of regular exercise was discussed with the patient.    Patient was given opportunity to ask questions. Patient verbalized understanding of the plan and was able to repeat key elements of the plan. All questions were answered to their satisfaction.  Maximino Greenland, MD   I, Maximino Greenland, MD, have reviewed all documentation for this visit. The documentation on 09/03/19 for the exam, diagnosis, procedures, and orders are all accurate and complete.  THE PATIENT IS ENCOURAGED TO PRACTICE SOCIAL DISTANCING DUE TO THE COVID-19 PANDEMIC.

## 2019-09-04 ENCOUNTER — Ambulatory Visit
Admission: RE | Admit: 2019-09-04 | Discharge: 2019-09-04 | Disposition: A | Discharge status: Home / Self Care | Payer: BC Managed Care – PPO | Source: Ambulatory Visit | Attending: Otolaryngology | Admitting: Otolaryngology

## 2019-09-04 DIAGNOSIS — R221 Localized swelling, mass and lump, neck: Secondary | ICD-10-CM

## 2019-09-04 MED ORDER — IOPAMIDOL (ISOVUE-300) INJECTION 61%
75.0000 mL | Freq: Once | INTRAVENOUS | Status: AC | PRN
  Administered 2019-09-04: 75 mL via INTRAVENOUS

## 2019-09-28 ENCOUNTER — Encounter: Payer: Self-pay | Admitting: Internal Medicine

## 2019-09-28 ENCOUNTER — Other Ambulatory Visit: Payer: Self-pay

## 2019-09-28 ENCOUNTER — Ambulatory Visit (INDEPENDENT_AMBULATORY_CARE_PROVIDER_SITE_OTHER): Payer: BC Managed Care – PPO | Admitting: Internal Medicine

## 2019-09-28 VITALS — BP 110/82 | HR 79 | Temp 98.2°F | Ht 60.2 in | Wt 326.0 lb

## 2019-09-28 DIAGNOSIS — G44209 Tension-type headache, unspecified, not intractable: Secondary | ICD-10-CM | POA: Diagnosis not present

## 2019-09-28 DIAGNOSIS — J3489 Other specified disorders of nose and nasal sinuses: Secondary | ICD-10-CM | POA: Diagnosis not present

## 2019-09-28 DIAGNOSIS — I10 Essential (primary) hypertension: Secondary | ICD-10-CM | POA: Diagnosis not present

## 2019-09-28 DIAGNOSIS — Z6841 Body Mass Index (BMI) 40.0 and over, adult: Secondary | ICD-10-CM

## 2019-09-28 MED ORDER — WEGOVY 1 MG/0.5ML ~~LOC~~ SOAJ: 1.0000 mg | SUBCUTANEOUS | 0 refills | Status: DC

## 2019-09-28 NOTE — Progress Notes (Signed)
I,Katawbba Wiggins,acting as a Education administrator for Maximino Greenland, MD.,have documented all relevant documentation on the behalf of Maximino Greenland, MD,as directed by  Maximino Greenland, MD while in the presence of Maximino Greenland, MD.  This visit occurred during the SARS-CoV-2 public health emergency.  Safety protocols were in place, including screening questions prior to the visit, additional usage of staff PPE, and extensive cleaning of exam room while observing appropriate contact time as indicated for disinfecting solutions.  Subjective:     Patient ID: Evelyn Wade , female    DOB: 02/21/1970 , 49 y.o.   MRN: [id number]   Chief Complaint  Patient presents with  . Obesity    HPI  She presents today for f/u obesity. She has been taking Wegovy without any issues.  She admits she is not exercising as much as she should.     Past Medical History:  Diagnosis Date  . Diabetes mellitus without complication ([place])   . Hypertension   . Pneumonia    hx  . Sleep apnea    cpap 4-5 yrs     Family History  Problem Relation Age of Onset  . Ovarian cancer Mother   . Heart attack Father   . Diabetes Father   . Hypertension Brother      Current Outpatient Medications:  .  albuterol (VENTOLIN HFA) 108 (90 Base) MCG/ACT inhaler, Inhale 2 puffs every 4 to 8 hours as needed, Disp: 3 Inhaler, Rfl: 3 .  ferrous sulfate 325 (65 FE) MG EC tablet, ferrous sulfate 325 mg (65 mg iron) tablet,delayed release  Take 1 tablet every day by oral route after meals for 30 days., Disp: , Rfl:  .  glucose blood test strip, Use as instructed, Disp: 50 each, Rfl: 12 .  HYDROcodone-acetaminophen (NORCO/VICODIN) 5-325 MG tablet, Take 1 tablet by mouth every 6 (six) hours as needed for moderate pain. (Patient not taking: Reported on 09/27/2019), Disp: 20 tablet, Rfl: 0 .  HYDROcodone-homatropine (HYDROMET) 5-1.5 MG/5ML syrup, Take 5 mLs by mouth every 6 (six) hours as needed for cough. (Patient not taking: Reported on  09/27/2019), Disp: 120 mL, Rfl: 0 .  ibuprofen (ADVIL,MOTRIN) 600 MG tablet, Take 1 tablet (600 mg total) by mouth every 6 (six) hours as needed. (Patient not taking: Reported on 09/27/2019), Disp: 30 tablet, Rfl: 2 .  olmesartan-hydrochlorothiazide (BENICAR HCT) 20-12.5 MG tablet, TAKE 1 TABLET BY MOUTH EVERY DAY, Disp: 90 tablet, Rfl: 2 .  Semaglutide-Weight Management (WEGOVY) 1 MG/0.5ML SOAJ, Inject 1 mg into the skin once a week., Disp: 2 mL, Rfl: 0   No Known Allergies   Review of Systems  Constitutional: Negative.  Negative for chills and fever.  HENT: Positive for rhinorrhea. Negative for sore throat.   Respiratory: Negative.  Negative for cough.   Cardiovascular: Negative.   Gastrointestinal: Negative.   Neurological: Positive for headaches.  Psychiatric/Behavioral: Negative.      Today's Vitals   09/28/19 1644  BP: 110/82  Pulse: 79  Temp: 98.2 F (36.8 C)  TempSrc: Oral  Weight: (!) 326 lb (147.9 kg)  Height: 5' 0.2" (1.529 m)   Body mass index is 63.25 kg/m.  Wt Readings from Last 3 Encounters:  09/28/19 (!) 326 lb (147.9 kg)  08/30/19 (!) 327 lb (148.3 kg)  08/02/19 (!) 331 lb (150.1 kg)   Objective:  Physical Exam Vitals and nursing note reviewed.  Constitutional:      Appearance: Normal appearance. She is obese.  HENT:  Head: Normocephalic and atraumatic.  Cardiovascular:     Rate and Rhythm: Normal rate and regular rhythm.     Heart sounds: Normal heart sounds.  Pulmonary:     Effort: Pulmonary effort is normal.     Breath sounds: Normal breath sounds.  Skin:    General: Skin is warm.  Neurological:     General: No focal deficit present.     Mental Status: She is alert.  Psychiatric:        Mood and Affect: Mood normal.        Behavior: Behavior normal.         Assessment And Plan:     1. Class 3 severe obesity due to excess calories with serious comorbidity and body mass index (BMI) of 60.0 to 69.9 in adult [facility]) Comments: She has lost  one pound since her last visit. I will increase the Wegovy to 1mg  once weekly. She is also encouraged to incorporate more exercise into her daily routine.   2. Essential hypertension, benign Comments: Chronic, well controlled. She will continue with current meds. She is encouraged to avoid adding salt to her foods.   3. Acute non intractable tension-type headache Comments: She is advised to apply Vicks to her temples and the nape of her neck. She will let me know if her sx persist. I do not think this is related to [facility] use.   4. Rhinorrhea Comments: I will check COVID test as requested.  - Novel Coronavirus, NAA ([facility])  She is encouraged to strive for BMI less than 30 to decrease cardiac risk. Advised to aim for at least 150 minutes of exercise per week.   Patient was given opportunity to ask questions. Patient verbalized understanding of the plan and was able to repeat key elements of the plan. All questions were answered to their satisfaction.  Maximino Greenland, MD   I, Maximino Greenland, MD, have reviewed all documentation for this visit. The documentation on 10/08/19 for the exam, diagnosis, procedures, and orders are all accurate and complete.  THE PATIENT IS ENCOURAGED TO PRACTICE SOCIAL DISTANCING DUE TO THE COVID-19 PANDEMIC.

## 2019-09-29 ENCOUNTER — Other Ambulatory Visit: Payer: Self-pay | Admitting: Nurse Practitioner

## 2019-09-29 LAB — NOVEL CORONAVIRUS, NAA: SARS-CoV-2, NAA: NOT DETECTED

## 2019-09-29 LAB — SARS-COV-2, NAA 2 DAY TAT

## 2019-10-23 ENCOUNTER — Encounter: Payer: Self-pay | Admitting: Internal Medicine

## 2019-10-24 ENCOUNTER — Other Ambulatory Visit: Payer: Self-pay

## 2019-10-24 ENCOUNTER — Ambulatory Visit: Payer: BC Managed Care – PPO

## 2019-10-24 VITALS — BP 134/70 | HR 83 | Ht 60.2 in | Wt 326.0 lb

## 2019-10-24 DIAGNOSIS — Z1152 Encounter for screening for COVID-19: Secondary | ICD-10-CM

## 2019-10-24 NOTE — Progress Notes (Signed)
Pt is here for covid test. She complains of headache and nausea. She was exposed at work

## 2019-10-26 LAB — NOVEL CORONAVIRUS, NAA: SARS-CoV-2, NAA: NOT DETECTED

## 2019-10-26 LAB — SARS-COV-2, NAA 2 DAY TAT

## 2019-10-27 ENCOUNTER — Other Ambulatory Visit: Payer: Self-pay

## 2019-10-27 DIAGNOSIS — Z1152 Encounter for screening for COVID-19: Secondary | ICD-10-CM

## 2019-10-27 MED ORDER — WEGOVY 1.7 MG/0.75ML ~~LOC~~ SOAJ: 1.7000 mg | SUBCUTANEOUS | 0 refills | Status: DC

## 2019-10-30 ENCOUNTER — Ambulatory Visit: Payer: BC Managed Care – PPO | Admitting: Internal Medicine

## 2019-10-30 ENCOUNTER — Other Ambulatory Visit: Payer: Self-pay | Admitting: Internal Medicine

## 2019-10-30 ENCOUNTER — Other Ambulatory Visit: Payer: Self-pay

## 2019-10-30 LAB — NOVEL CORONAVIRUS, NAA: SARS-CoV-2, NAA: NOT DETECTED

## 2019-10-30 MED ORDER — WEGOVY 1.7 MG/0.75ML ~~LOC~~ SOAJ: 1.7000 mg | SUBCUTANEOUS | 0 refills | Status: DC

## 2019-10-30 MED ORDER — WEGOVY 1 MG/0.5ML ~~LOC~~ SOAJ: 1.0000 mg | SUBCUTANEOUS | 0 refills | Status: DC

## 2019-10-30 MED FILL — WEGOVY 1 MG/0.5ML SOAJ: 1 | 28 days supply | Qty: 2 | Fill #0

## 2019-11-09 ENCOUNTER — Other Ambulatory Visit: Payer: Self-pay

## 2019-11-09 ENCOUNTER — Ambulatory Visit (INDEPENDENT_AMBULATORY_CARE_PROVIDER_SITE_OTHER): Payer: BC Managed Care – PPO | Admitting: Internal Medicine

## 2019-11-09 ENCOUNTER — Encounter: Payer: Self-pay | Admitting: Internal Medicine

## 2019-11-09 VITALS — BP 114/82 | HR 80 | Temp 98.6°F | Ht 60.2 in | Wt 325.0 lb

## 2019-11-09 DIAGNOSIS — Z6841 Body Mass Index (BMI) 40.0 and over, adult: Secondary | ICD-10-CM

## 2019-11-09 DIAGNOSIS — Z23 Encounter for immunization: Secondary | ICD-10-CM

## 2019-11-09 NOTE — Progress Notes (Signed)
I,Katawbba Wiggins,acting as a Education administrator for Maximino Greenland, MD.,have documented all relevant documentation on the behalf of Maximino Greenland, MD,as directed by  Maximino Greenland, MD while in the prese  This visit occurred during the SARS-CoV-2 public health emergency.  Safety protocols were in place, including screening questions prior to the visit, additional usage of staff PPE, and extensive cleaning of exam room while observing appropriate contact time as indicated for disinfecting solutions.  Subjective:     Patient ID: Evelyn Wade , female    DOB: 07-04-1970 , 49 y.o.   MRN: [id number]   Chief Complaint  Patient presents with  . Obesity    HPI  She presents today for f/u obesity. She has been taking Wegovy without any issues.  She admits she is not exercising as much as she should.     Past Medical History:  Diagnosis Date  . Diabetes mellitus without complication ([place])   . Hypertension   . Pneumonia    hx  . Sleep apnea    cpap 4-5 yrs     Family History  Problem Relation Age of Onset  . Ovarian cancer Mother   . Heart attack Father   . Diabetes Father   . Hypertension Brother      Current Outpatient Medications:  .  albuterol (VENTOLIN HFA) 108 (90 Base) MCG/ACT inhaler, Inhale 2 puffs every 4 to 8 hours as needed, Disp: 3 Inhaler, Rfl: 3 .  ferrous sulfate 325 (65 FE) MG EC tablet, ferrous sulfate 325 mg (65 mg iron) tablet,delayed release  Take 1 tablet every day by oral route after meals for 30 days., Disp: , Rfl:  .  glucose blood test strip, Use as instructed, Disp: 50 each, Rfl: 12 .  olmesartan-hydrochlorothiazide (BENICAR HCT) 20-12.5 MG tablet, TAKE 1 TABLET BY MOUTH EVERY DAY, Disp: 90 tablet, Rfl: 2 .  Semaglutide-Weight Management (WEGOVY) 1 MG/0.5ML SOAJ, Inject 1 mg into the skin once a week., Disp: 2 mL, Rfl: 0 .  HYDROcodone-acetaminophen (NORCO/VICODIN) 5-325 MG tablet, Take 1 tablet by mouth every 6 (six) hours as needed for moderate pain.  (Patient not taking: Reported on 09/27/2019), Disp: 20 tablet, Rfl: 0 .  HYDROcodone-homatropine (HYDROMET) 5-1.5 MG/5ML syrup, Take 5 mLs by mouth every 6 (six) hours as needed for cough. (Patient not taking: Reported on 09/27/2019), Disp: 120 mL, Rfl: 0 .  ibuprofen (ADVIL,MOTRIN) 600 MG tablet, Take 1 tablet (600 mg total) by mouth every 6 (six) hours as needed. (Patient not taking: Reported on 09/27/2019), Disp: 30 tablet, Rfl: 2 .  Semaglutide-Weight Management (WEGOVY) 1.7 MG/0.75ML SOAJ, Inject 1.7 mg into the skin once a week. (Patient not taking: Reported on 11/09/2019), Disp: 3 mL, Rfl: 0   No Known Allergies   Review of Systems  Constitutional: Negative.   Respiratory: Negative.   Cardiovascular: Negative.   Gastrointestinal: Negative.   Psychiatric/Behavioral: Negative.   All other systems reviewed and are negative.    Today's Vitals   11/09/19 1640  BP: 114/82  Pulse: 80  Temp: 98.6 F (37 C)  TempSrc: Oral  Weight: (!) 325 lb (147.4 kg)  Height: 5' 0.2" (1.529 m)   Body mass index is 63.05 kg/m.  Wt Readings from Last 3 Encounters:  11/09/19 (!) 325 lb (147.4 kg)  10/24/19 (!) 326 lb (147.9 kg)  09/28/19 (!) 326 lb (147.9 kg)   Objective:  Physical Exam Vitals and nursing note reviewed.  Constitutional:      Appearance: Normal appearance. She  is obese.  HENT:     Head: Normocephalic and atraumatic.  Cardiovascular:     Rate and Rhythm: Normal rate and regular rhythm.     Heart sounds: Normal heart sounds.  Pulmonary:     Breath sounds: Normal breath sounds.  Skin:    General: Skin is warm.  Neurological:     General: No focal deficit present.     Mental Status: She is alert and oriented to person, place, and time.         Assessment And Plan:     1. Class 3 severe obesity due to excess calories with serious comorbidity and body mass index (BMI) of 60.0 to 69.9 in adult [facility]) Comments: She has lost one pound since last visit. Encouraged to incorporate  more exercise into her daily routine. Advised to aim for at least 150 minutes of exercise per week. She is encouraged to continue with Wegovy 1mg  once weekly, until the 1.7mg  dose is available to her. I will send in a rx to her local pharmacy. She will rto in six to eight weeks for re-evaluation.   2. Need for vaccination Comments: She was given flu vaccine to update her immunization history. - Flu Vaccine QUAD 6+ mos PF IM (Fluarix Quad PF)     Patient was given opportunity to ask questions. Patient verbalized understanding of the plan and was able to repeat key elements of the plan. All questions were answered to their satisfaction.  Maximino Greenland, MD   I, Maximino Greenland, MD, have reviewed all documentation for this visit. The documentation on 11/12/19 for the exam, diagnosis, procedures, and orders are all accurate and complete.  THE PATIENT IS ENCOURAGED TO PRACTICE SOCIAL DISTANCING DUE TO THE COVID-19 PANDEMIC.  nce of Maximino Greenland, MD.

## 2019-11-09 NOTE — Patient Instructions (Signed)

## 2019-11-16 MED FILL — WEGOVY 1.7 MG/0.75ML SOAJ: 1.7 | 28 days supply | Qty: 3 | Fill #0

## 2019-11-23 ENCOUNTER — Other Ambulatory Visit: Payer: Self-pay

## 2019-11-23 MED ORDER — INTEGRA 62.5-62.5-40-3 MG PO CAPS: ORAL_CAPSULE | ORAL | 1 refills | Status: DC

## 2019-12-27 ENCOUNTER — Encounter: Payer: Self-pay | Admitting: Internal Medicine

## 2019-12-27 ENCOUNTER — Other Ambulatory Visit: Payer: Self-pay | Admitting: Internal Medicine

## 2019-12-27 ENCOUNTER — Ambulatory Visit: Payer: BC Managed Care – PPO | Admitting: Internal Medicine

## 2019-12-27 ENCOUNTER — Other Ambulatory Visit: Payer: Self-pay

## 2019-12-27 VITALS — BP 112/68 | HR 86 | Temp 98.6°F | Ht 60.2 in | Wt 325.4 lb

## 2019-12-27 DIAGNOSIS — I10 Essential (primary) hypertension: Secondary | ICD-10-CM

## 2019-12-27 DIAGNOSIS — Z6841 Body Mass Index (BMI) 40.0 and over, adult: Secondary | ICD-10-CM

## 2019-12-27 DIAGNOSIS — E1169 Type 2 diabetes mellitus with other specified complication: Secondary | ICD-10-CM

## 2019-12-27 MED ORDER — WEGOVY 2.4 MG/0.75ML ~~LOC~~ SOAJ: 2.4000 mg | SUBCUTANEOUS | 2 refills | Status: DC

## 2019-12-27 NOTE — Progress Notes (Signed)
I,Katawbba Wiggins,acting as a Education administrator for Maximino Greenland, MD.,have documented all relevant documentation on the behalf of Maximino Greenland, MD,as directed by  Maximino Greenland, MD while in the presence of Maximino Greenland, MD.  This visit occurred during the SARS-CoV-2 public health emergency.  Safety protocols were in place, including screening questions prior to the visit, additional usage of staff PPE, and extensive cleaning of exam room while observing appropriate contact time as indicated for disinfecting solutions.  Subjective:     Patient ID: Evelyn Wade , female    DOB: 06-17-1970 , 49 y.o.   MRN: [id number]   Chief Complaint  Patient presents with  . Obesity    HPI  She presents today for f/u obesity. She has been taking Wegovy without any issues.  She admits she is not exercising as much as she should.  Diabetes She presents for her follow-up diabetic visit. She has type 2 diabetes mellitus. Her disease course has been stable. There are no hypoglycemic associated symptoms. Pertinent negatives for diabetes include no blurred vision and no chest pain. There are no hypoglycemic complications. Symptoms are stable. Risk factors for coronary artery disease include diabetes mellitus, hypertension, obesity and sedentary lifestyle. Her weight is decreasing steadily. She is following a diabetic diet. Her breakfast blood glucose is taken between 7-8 am. Her breakfast blood glucose range is generally 90-110 mg/dl. An ACE inhibitor/angiotensin II receptor blocker is being taken. Eye exam is current.  Hypertension This is a chronic problem. The current episode started more than 1 year ago. The problem is controlled. Associated symptoms include neck pain. Pertinent negatives include no blurred vision or chest pain. Past treatments include ACE inhibitors.     Past Medical History:  Diagnosis Date  . Diabetes mellitus without complication ([place])   . Hypertension   . Pneumonia    hx  . Sleep  apnea    cpap 4-5 yrs     Family History  Problem Relation Age of Onset  . Ovarian cancer Mother   . Heart attack Father   . Diabetes Father   . Hypertension Brother      Current Outpatient Medications:  .  albuterol (VENTOLIN HFA) 108 (90 Base) MCG/ACT inhaler, Inhale 2 puffs every 4 to 8 hours as needed, Disp: 3 Inhaler, Rfl: 3 .  Fe Fum-FePoly-Vit C-Vit B3 (INTEGRA) 62.5-62.5-40-3 MG CAPS, Take 1 capsule by mouth daily, Disp: 90 capsule, Rfl: 1 .  ferrous sulfate 325 (65 FE) MG EC tablet, ferrous sulfate 325 mg (65 mg iron) tablet,delayed release  Take 1 tablet every day by oral route after meals for 30 days., Disp: , Rfl:  .  glucose blood test strip, Use as instructed, Disp: 50 each, Rfl: 12 .  olmesartan-hydrochlorothiazide (BENICAR HCT) 20-12.5 MG tablet, TAKE 1 TABLET BY MOUTH EVERY DAY, Disp: 90 tablet, Rfl: 2 .  Semaglutide-Weight Management (WEGOVY) 2.4 MG/0.75ML SOAJ, Inject 2.4 mg into the skin once a week., Disp: 3 mL, Rfl: 2   No Known Allergies   Review of Systems  Constitutional: Negative.   Eyes: Negative for blurred vision.  Respiratory: Negative.   Cardiovascular: Negative.  Negative for chest pain.  Gastrointestinal: Negative.   Musculoskeletal: Positive for neck pain.  Psychiatric/Behavioral: Negative.   All other systems reviewed and are negative.    Today's Vitals   12/27/19 1633  BP: 112/68  Pulse: 86  Temp: 98.6 F (37 C)  TempSrc: Oral  Weight: (!) 325 lb 6.4 oz (147.6 kg)  Height: 5' 0.2" (1.529 m)   Body mass index is 63.13 kg/m.  Wt Readings from Last 3 Encounters:  12/27/19 (!) 325 lb 6.4 oz (147.6 kg)  11/09/19 (!) 325 lb (147.4 kg)  10/24/19 (!) 326 lb (147.9 kg)   Objective:  Physical Exam Vitals and nursing note reviewed.  Constitutional:      Appearance: Normal appearance. She is obese.  HENT:     Head: Normocephalic and atraumatic.  Cardiovascular:     Rate and Rhythm: Normal rate and regular rhythm.     Heart sounds:  Normal heart sounds.  Pulmonary:     Breath sounds: Normal breath sounds.  Skin:    General: Skin is warm.  Neurological:     General: No focal deficit present.     Mental Status: She is alert and oriented to person, place, and time.         Assessment And Plan:     1. Class 3 severe obesity due to excess calories with serious comorbidity and body mass index (BMI) of 60.0 to 69.9 in adult [facility]) Comments: She will continue with Wegovy, I will send in higher dose 2.$RemoveBefor'4mg'[id number]$  once weekly. She is reminded of importance of regular exercise. Encouraged to start with 15 minutes and work up to at least 30 minutes five days per week. She will f/u in 8 weeks for re-evaluation.   2. Type 2 diabetes mellitus with other specified complication, without long-term current use of insulin ([facility]) Comments: Chronic, I will check labs as listed below. Encouraged to avoid sugary beverages.  - BMP8+EGFR - Hemoglobin A1c  3. Essential hypertension, benign Comments: Chronic, well controlled. She will continue with current meds. I will check renal function today.      Patient was given opportunity to ask questions. Patient verbalized understanding of the plan and was able to repeat key elements of the plan. All questions were answered to their satisfaction.  Maximino Greenland, MD   I, Maximino Greenland, MD, have reviewed all documentation for this visit. The documentation on 01/21/20 for the exam, diagnosis, procedures, and orders are all accurate and complete.  THE PATIENT IS ENCOURAGED TO PRACTICE SOCIAL DISTANCING DUE TO THE COVID-19 PANDEMIC.

## 2019-12-27 NOTE — Patient Instructions (Signed)

## 2019-12-28 LAB — BMP8+EGFR
BUN/Creatinine Ratio: 21 (ref 9–23)
BUN: 16 mg/dL (ref 6–24)
CO2: 25 mmol/L (ref 20–29)
Calcium: 10.3 mg/dL — ABNORMAL HIGH (ref 8.7–10.2)
Chloride: 105 mmol/L (ref 96–106)
Creatinine, Ser: 0.76 mg/dL (ref 0.57–1.00)
GFR calc Af Amer: 108 mL/min/{1.73_m2} (ref >59)
GFR calc non Af Amer: 94 mL/min/{1.73_m2} (ref >59)
Glucose: 98 mg/dL (ref 65–99)
Potassium: 4.7 mmol/L (ref 3.5–5.2)
Sodium: 141 mmol/L (ref 134–144)

## 2019-12-28 LAB — HEMOGLOBIN A1C
Est. average glucose Bld gHb Est-mCnc: 108 mg/dL
Hgb A1c MFr Bld: 5.4 % (ref 4.8–5.6)

## 2019-12-28 MED FILL — WEGOVY 2.4 MG/0.75ML SOAJ: 2.4 | 28 days supply | Qty: 3 | Fill #0

## 2020-01-11 ENCOUNTER — Ambulatory Visit: Payer: BC Managed Care – PPO | Admitting: Internal Medicine

## 2020-01-21 DIAGNOSIS — I10 Essential (primary) hypertension: Secondary | ICD-10-CM | POA: Insufficient documentation

## 2020-02-08 LAB — HM DIABETES EYE EXAM

## 2020-02-12 ENCOUNTER — Encounter: Payer: Self-pay | Admitting: Internal Medicine

## 2020-02-21 ENCOUNTER — Ambulatory Visit: Payer: BC Managed Care – PPO | Admitting: Internal Medicine

## 2020-02-21 ENCOUNTER — Other Ambulatory Visit: Payer: Self-pay

## 2020-02-21 ENCOUNTER — Other Ambulatory Visit: Payer: Self-pay | Admitting: Internal Medicine

## 2020-02-21 ENCOUNTER — Encounter: Payer: Self-pay | Admitting: Internal Medicine

## 2020-02-21 VITALS — BP 114/78 | HR 84 | Temp 98.1°F | Ht 60.2 in | Wt 332.3 lb

## 2020-02-21 DIAGNOSIS — Z6841 Body Mass Index (BMI) 40.0 and over, adult: Secondary | ICD-10-CM | POA: Diagnosis not present

## 2020-02-21 DIAGNOSIS — F4329 Adjustment disorder with other symptoms: Secondary | ICD-10-CM | POA: Diagnosis not present

## 2020-02-21 DIAGNOSIS — E66813 Obesity, class 3: Secondary | ICD-10-CM

## 2020-02-21 DIAGNOSIS — I10 Essential (primary) hypertension: Secondary | ICD-10-CM

## 2020-02-21 MED ORDER — WEGOVY 2.4 MG/0.75ML ~~LOC~~ SOAJ
2.4000 mg | SUBCUTANEOUS | 2 refills | Status: DC
Start: 2020-02-21 — End: 2020-02-21

## 2020-02-21 MED FILL — WEGOVY 2.4 MG/0.75ML SOAJ: 2.4 | 28 days supply | Qty: 3 | Fill #0

## 2020-02-21 NOTE — Progress Notes (Signed)
I,Katawbba Wiggins,acting as a Education administrator for Maximino Greenland, MD.,have documented all relevant documentation on the behalf of Maximino Greenland, MD,as directed by  Maximino Greenland, MD while in the presence of Maximino Greenland, MD.  This visit occurred during the SARS-CoV-2 public health emergency.  Safety protocols were in place, including screening questions prior to the visit, additional usage of staff PPE, and extensive cleaning of exam room while observing appropriate contact time as indicated for disinfecting solutions.  Subjective:     Patient ID: Evelyn Wade , female    DOB: 1970/08/06 , 50 y.o.   MRN: [id number]   Chief Complaint  Patient presents with   Obesity    HPI  She presents today for f/u obesity. She has been taking Wegovy without any issues.  However, states she has been out for about 3 weeks. She admits she is not exercising as much as she should.   She is feeling overwhelmed. She is in grad school, married and has a young son. Her husband works 3rd shift so she usually handles day to day needs for her son.  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 Unfortunately, the cancer has returned. The most recent chemo regimen was ineffective. She is having difficulty dealing with her Mom's illness.   Hypertension This is a chronic problem. The current episode started more than 1 year ago. The problem is controlled. Past treatments include ACE inhibitors.     Past Medical History:  Diagnosis Date   Diabetes mellitus without complication (HCC)    Hypertension    Pneumonia    hx   Sleep apnea    cpap 4-5 yrs     Family History  Problem Relation Age of Onset   Ovarian cancer Mother    Heart attack Father    Diabetes Father    Hypertension Brother      Current Outpatient Medications:    albuterol (VENTOLIN HFA) 108 (90 Base) MCG/ACT inhaler, Inhale 2 puffs every 4 to 8 hours as needed, Disp: 3 Inhaler, Rfl: 3    Fe Fum-FePoly-Vit C-Vit B3 (INTEGRA) 62.5-62.5-40-3 MG CAPS, Take 1 capsule by mouth daily, Disp: 90 capsule, Rfl: 1   glucose blood test strip, Use as instructed, Disp: 50 each, Rfl: 12   olmesartan-hydrochlorothiazide (BENICAR HCT) 20-12.5 MG tablet, TAKE 1 TABLET BY MOUTH EVERY DAY, Disp: 90 tablet, Rfl: 2   Semaglutide-Weight Management (WEGOVY) 2.4 MG/0.75ML SOAJ, Inject 2.4 mg into the skin once a week., Disp: 3 mL, Rfl: 2   No Known Allergies   Review of Systems  Constitutional: Negative.   Respiratory: Negative.   Cardiovascular: Negative.   Gastrointestinal: Negative.   All other systems reviewed and are negative.    Today's Vitals   02/21/20 1559  BP: 114/78  Pulse: 84  Temp: 98.1 F (36.7 C)  TempSrc: Oral  Weight: (!) 332 lb 4.8 oz (150.7 kg)  Height: 5' 0.2" (1.529 m)   Body mass index is 64.47 kg/m.  Wt Readings from Last 3 Encounters:  02/21/20 (!) 332 lb 4.8 oz (150.7 kg)  12/27/19 (!) 325 lb 6.4 oz (147.6 kg)  11/09/19 (!) 325 lb (147.4 kg)    Objective:  Physical Exam Vitals and nursing note reviewed.  Constitutional:      Appearance: Normal appearance. She is obese.  HENT:     Head: Normocephalic and atraumatic.  Cardiovascular:     Rate and Rhythm: Normal rate and regular  rhythm.     Heart sounds: Normal heart sounds.  Pulmonary:     Breath sounds: Normal breath sounds.  Skin:    General: Skin is warm.  Neurological:     General: No focal deficit present.     Mental Status: She is alert and oriented to person, place, and time.         Assessment And Plan:     1. Class 3 severe obesity due to excess calories with serious comorbidity and body mass index (BMI) of 60.0 to 69.9 in adult [facility]) Comments: I will send refills for [facility] to [facility] pharmacy. Advised to take weekly as directed. Importance of regular exercise was stressed to the patient. I will also refer her to [facility] for their Bariatric program. She had bariatric sleeve surgery in  2014. I think she would benefit from their comprehensive program. She will f/u in 6-8 weeks for re-evaluation.  - Ambulatory referral to Internal Medicine  2. Essential hypertension, benign Comments: Despite her stress, her BP is well controlled. Advised to c/w olmesartan/hctz.   3. Stress and adjustment reaction Comments: She agrees to referral for Psychology evaluation. I think therapy will help her deal with current stressors. She is willing to give this a try. She is also encouraged to make time for herself - walking, exercise, sitting in coffee shop, etc. May also benefit from magnesium and B-complex supplementation.  - Ambulatory referral to Psychology  Patient was given opportunity to ask questions. Patient verbalized understanding of the plan and was able to repeat key elements of the plan. All questions were answered to their satisfaction.  Maximino Greenland, MD   I, Maximino Greenland, MD, have reviewed all documentation for this visit. The documentation on 02/22/20 for the exam, diagnosis, procedures, and orders are all accurate and complete.  THE PATIENT IS ENCOURAGED TO PRACTICE SOCIAL DISTANCING DUE TO THE COVID-19 PANDEMIC.

## 2020-02-21 NOTE — Patient Instructions (Signed)

## 2020-04-16 ENCOUNTER — Encounter: Payer: Self-pay | Admitting: Internal Medicine

## 2020-04-17 ENCOUNTER — Ambulatory Visit: Payer: BC Managed Care – PPO | Admitting: Internal Medicine

## 2020-05-23 ENCOUNTER — Other Ambulatory Visit: Payer: Self-pay

## 2020-05-23 ENCOUNTER — Encounter: Payer: Self-pay | Admitting: Internal Medicine

## 2020-05-23 ENCOUNTER — Ambulatory Visit: Payer: BC Managed Care – PPO | Admitting: Internal Medicine

## 2020-05-23 ENCOUNTER — Other Ambulatory Visit (HOSPITAL_COMMUNITY): Payer: Self-pay

## 2020-05-23 VITALS — BP 114/70 | HR 73 | Temp 98.0°F | Ht 60.2 in | Wt 337.0 lb

## 2020-05-23 DIAGNOSIS — R1031 Right lower quadrant pain: Secondary | ICD-10-CM | POA: Diagnosis not present

## 2020-05-23 DIAGNOSIS — E1169 Type 2 diabetes mellitus with other specified complication: Secondary | ICD-10-CM | POA: Diagnosis not present

## 2020-05-23 DIAGNOSIS — I10 Essential (primary) hypertension: Secondary | ICD-10-CM

## 2020-05-23 DIAGNOSIS — Z6841 Body Mass Index (BMI) 40.0 and over, adult: Secondary | ICD-10-CM

## 2020-05-23 MED ORDER — SEMAGLUTIDE-WEIGHT MANAGEMENT 2.4 MG/0.75ML ~~LOC~~ SOAJ
2.4000 mg | SUBCUTANEOUS | 2 refills | Status: DC
  Filled 2020-05-23: qty 3, 28d supply, fill #0
  Filled 2020-07-04 – 2021-01-10 (×3): qty 3, 28d supply, fill #1

## 2020-05-23 MED ORDER — INTEGRA 62.5-62.5-40-3 MG PO CAPS
1.0000 | ORAL_CAPSULE | Freq: Every day | ORAL | 1 refills | Status: DC
  Filled 2020-05-23: qty 90, 90d supply, fill #0
  Filled 2021-01-10 (×2): qty 90, 90d supply, fill #1

## 2020-05-23 NOTE — Patient Instructions (Addendum)
Psoas muscle stretches   E2M fitness  Exercising to Lose Weight Exercise is structured, repetitive physical activity to improve fitness and health. Getting regular exercise is important for everyone. It is especially important if you are overweight. Being overweight increases your risk of heart disease, stroke, diabetes, high blood pressure, and several types of cancer. Reducing your calorie intake and exercising can help you lose weight. Exercise is usually categorized as moderate or vigorous intensity. To lose weight, most people need to do a certain amount of moderate-intensity or vigorous-intensity exercise each week. Moderate-intensity exercise Moderate-intensity exercise is any activity that gets you moving enough to burn at least three times more energy (calories) than if you were sitting. Examples of moderate exercise include:  Walking a mile in 15 minutes.  Doing light yard work.  Biking at an easy pace. Most people should get at least 150 minutes (2 hours and 30 minutes) a week of moderate-intensity exercise to maintain their body weight.   Vigorous-intensity exercise Vigorous-intensity exercise is any activity that gets you moving enough to burn at least six times more calories than if you were sitting. When you exercise at this intensity, you should be working hard enough that you are not able to carry on a conversation. Examples of vigorous exercise include:  Running.  Playing a team sport, such as football, basketball, and soccer.  Jumping rope. Most people should get at least 75 minutes (1 hour and 15 minutes) a week of vigorous-intensity exercise to maintain their body weight. How can exercise affect me? When you exercise enough to burn more calories than you eat, you lose weight. Exercise also reduces body fat and builds muscle. The more muscle you have, the more calories you burn. Exercise also:  Improves mood.  Reduces stress and tension.  Improves your overall  fitness, flexibility, and endurance.  Increases bone strength. The amount of exercise you need to lose weight depends on:  Your age.  The type of exercise.  Any health conditions you have.  Your overall physical ability. Talk to your health care provider about how much exercise you need and what types of activities are safe for you. What actions can I take to lose weight? Nutrition  Make changes to your diet as told by your health care provider or diet and nutrition specialist (dietitian). This may include: ? Eating fewer calories. ? Eating more protein. ? Eating less unhealthy fats. ? Eating a diet that includes fresh fruits and vegetables, whole grains, low-fat dairy products, and lean protein. ? Avoiding foods with added fat, salt, and sugar.  Drink plenty of water while you exercise to prevent dehydration or heat stroke.   Activity  Choose an activity that you enjoy and set realistic goals. Your health care provider can help you make an exercise plan that works for you.  Exercise at a moderate or vigorous intensity most days of the week. ? The intensity of exercise may vary from person to person. You can tell how intense a workout is for you by paying attention to your breathing and heartbeat. Most people will notice their breathing and heartbeat get faster with more intense exercise.  Do resistance training twice each week, such as: ? Push-ups. ? Sit-ups. ? Lifting weights. ? Using resistance bands.  Getting short amounts of exercise can be just as helpful as long structured periods of exercise. If you have trouble finding time to exercise, try to include exercise in your daily routine. ? Get up, stretch, and walk around  every 30 minutes throughout the day. ? Go for a walk during your lunch break. ? Park your car farther away from your destination. ? If you take public transportation, get off one stop early and walk the rest of the way. ? Make phone calls while standing  up and walking around. ? Take the stairs instead of elevators or escalators.  Wear comfortable clothes and shoes with good support.  Do not exercise so much that you hurt yourself, feel dizzy, or get very short of breath. Where to find more information  [place]. Department of Health and Human Services: [employer]at  Centers for Disease Control and Prevention (CDC): http://www.wolf.info/ Contact a health care provider:  Before starting a new exercise program.  If you have questions or concerns about your weight.  If you have a medical problem that keeps you from exercising. Get help right away if you have any of the following while exercising:  Injury.  Dizziness.  Difficulty breathing or shortness of breath that does not go away when you stop exercising.  Chest pain.  Rapid heartbeat. Summary  Being overweight increases your risk of heart disease, stroke, diabetes, high blood pressure, and several types of cancer.  Losing weight happens when you burn more calories than you eat.  Reducing the amount of calories you eat in addition to getting regular moderate or vigorous exercise each week helps you lose weight. This information is not intended to replace advice given to you by your health care provider. Make sure you discuss any questions you have with your health care provider. Document Revised: 05/25/2019 Document Reviewed: 05/25/2019 Elsevier Patient Education  2021 [employer].

## 2020-05-23 NOTE — Progress Notes (Signed)
I,Katawbba Wiggins,acting as a Education administrator for Maximino Greenland, MD.,have documented all relevant documentation on the behalf of Maximino Greenland, MD,as directed by  Maximino Greenland, MD while in the presence of Maximino Greenland, MD  This visit occurred during the SARS-CoV-2 public health emergency.  Safety protocols were in place, including screening questions prior to the visit, additional usage of staff PPE, and extensive cleaning of exam room while observing appropriate contact time as indicated for disinfecting solutions.  Subjective:     Patient ID: Evelyn Wade , female    DOB: 04-07-1970 , 50 y.o.   MRN: [id number]   Chief Complaint  Patient presents with  . Obesity  . Diabetes  . Hypertension    HPI  She presents today for f/u obesity.  The patient admits that she hasn't had her Wegovy in 2 weeks.  She admits that she did not call for a refill. Also admits she is not exercising as much as she should.   Hypertension This is a chronic problem. The current episode started more than 1 year ago. The problem is controlled. Past treatments include ACE inhibitors.     Past Medical History:  Diagnosis Date  . Diabetes mellitus without complication ([place])   . Hypertension   . Pneumonia    hx  . Sleep apnea    cpap 4-5 yrs     Family History  Problem Relation Age of Onset  . Ovarian cancer Mother   . Heart attack Father   . Diabetes Father   . Hypertension Brother      Current Outpatient Medications:  .  albuterol (VENTOLIN HFA) 108 (90 Base) MCG/ACT inhaler, Inhale 2 puffs every 4 to 8 hours as needed, Disp: 3 Inhaler, Rfl: 3 .  glucose blood test strip, Use as instructed, Disp: 50 each, Rfl: 12 .  olmesartan-hydrochlorothiazide (BENICAR HCT) 20-12.5 MG tablet, TAKE 1 TABLET BY MOUTH EVERY DAY, Disp: 90 tablet, Rfl: 2 .  celecoxib (CELEBREX) 200 MG capsule, Take 1 capsule (200 mg total) by mouth daily., Disp: 30 capsule, Rfl: 3 .  Fe Fum-FePoly-Vit C-Vit B3 (INTEGRA)  62.5-62.5-40-3 MG CAPS, Take 1 capsule by mouth daily., Disp: 90 capsule, Rfl: 1 .  Semaglutide-Weight Management 2.4 MG/0.75ML SOAJ, Inject 2.4 mg into the skin once a week., Disp: 3 mL, Rfl: 2   No Known Allergies   Review of Systems  Constitutional: Negative.   Respiratory: Negative.   Cardiovascular: Negative.   Gastrointestinal: Negative.   Musculoskeletal: Positive for myalgias.       She c/o r groin pain. Denies fall/trauma. Not sure what may have triggered sx. There is some pain with ambulation.   Psychiatric/Behavioral: Negative.   All other systems reviewed and are negative.    Today's Vitals   05/23/20 1626  BP: 114/70  Pulse: 73  Temp: 98 F (36.7 C)  TempSrc: Oral  Weight: (!) 337 lb (152.9 kg)  Height: 5' 0.2" (1.529 m)   Body mass index is 65.38 kg/m.  Wt Readings from Last 3 Encounters:  05/23/20 (!) 337 lb (152.9 kg)  02/21/20 (!) 332 lb 4.8 oz (150.7 kg)  12/27/19 (!) 325 lb 6.4 oz (147.6 kg)   Objective:  Physical Exam Vitals and nursing note reviewed.  Constitutional:      Appearance: Normal appearance. She is obese.  HENT:     Head: Normocephalic and atraumatic.     Nose:     Comments: Masked     Mouth/Throat:  Comments: Masked  Eyes:     Extraocular Movements: Extraocular movements intact.  Cardiovascular:     Rate and Rhythm: Normal rate and regular rhythm.     Heart sounds: Normal heart sounds.  Pulmonary:     Effort: Pulmonary effort is normal.     Breath sounds: Normal breath sounds.  Musculoskeletal:     Cervical back: Normal range of motion.     Comments: r groin tenderness to palpation  Skin:    General: Skin is warm.  Neurological:     General: No focal deficit present.     Mental Status: She is alert.  Psychiatric:        Mood and Affect: Mood normal.        Behavior: Behavior normal.         Assessment And Plan:     1. Class 3 severe obesity due to excess calories with serious comorbidity and body mass index (BMI)  of 60.0 to 69.9 in adult [facility]) Comments: She is aware of 5 pound weight gain. Advised to look into [employer] for nutrition/exercise guidance. She will c/w Wegovy. She will f/u in 8 weeks for re-evalu  2. Essential hypertension, benign Comments: Well controlled. She will c/w current meds. Advised to limit her salt intake.  3. Type 2 diabetes mellitus with other specified complication, without long-term current use of insulin ([place]) Comments: I will check labs as listed below. Again, importance of regular exercise was d/w patient.  - CMP14+EGFR - Hemoglobin A1c  4. Right groin pain Comments: Likely due to pulled hip flexor/psoas. She was given some stretching exercises to perform daily. I will refer her to [facility] should her sx persist.     Patient was given opportunity to ask questions. Patient verbalized understanding of the plan and was able to repeat key elements of the plan. All questions were answered to their satisfaction.   I, Maximino Greenland, MD, have reviewed all documentation for this visit. The documentation on 05/23/20 for the exam, diagnosis, procedures, and orders are all accurate and complete.   IF YOU HAVE BEEN REFERRED TO A SPECIALIST, IT MAY TAKE 1-2 WEEKS TO SCHEDULE/PROCESS THE REFERRAL. IF YOU HAVE NOT HEARD FROM US/SPECIALIST IN TWO WEEKS, PLEASE GIVE [place] A CALL AT [phone].   THE PATIENT IS ENCOURAGED TO PRACTICE SOCIAL DISTANCING DUE TO THE COVID-19 PANDEMIC.

## 2020-05-24 ENCOUNTER — Other Ambulatory Visit (HOSPITAL_COMMUNITY): Payer: Self-pay

## 2020-05-24 LAB — CMP14+EGFR
ALT: 18 IU/L (ref 0–32)
AST: 17 IU/L (ref 0–40)
Albumin/Globulin Ratio: 1.6 (ref 1.2–2.2)
Albumin: 4.4 g/dL (ref 3.8–4.8)
Alkaline Phosphatase: 82 IU/L (ref 44–121)
BUN/Creatinine Ratio: 21 (ref 9–23)
BUN: 16 mg/dL (ref 6–24)
Bilirubin Total: 0.3 mg/dL (ref 0.0–1.2)
CO2: 22 mmol/L (ref 20–29)
Calcium: 10.6 mg/dL — ABNORMAL HIGH (ref 8.7–10.2)
Chloride: 102 mmol/L (ref 96–106)
Creatinine, Ser: 0.78 mg/dL (ref 0.57–1.00)
Globulin, Total: 2.7 g/dL (ref 1.5–4.5)
Glucose: 96 mg/dL (ref 65–99)
Potassium: 4.5 mmol/L (ref 3.5–5.2)
Sodium: 139 mmol/L (ref 134–144)
Total Protein: 7.1 g/dL (ref 6.0–8.5)
eGFR: 94 mL/min/{1.73_m2} (ref >59)

## 2020-05-24 LAB — HEMOGLOBIN A1C
Est. average glucose Bld gHb Est-mCnc: 108 mg/dL
Hgb A1c MFr Bld: 5.4 % (ref 4.8–5.6)

## 2020-05-27 ENCOUNTER — Other Ambulatory Visit (HOSPITAL_COMMUNITY): Payer: Self-pay

## 2020-05-27 MED ORDER — CELECOXIB 200 MG PO CAPS
200.0000 mg | ORAL_CAPSULE | Freq: Every day | ORAL | 3 refills | Status: DC
  Filled 2020-05-27: qty 30, 30d supply, fill #0
  Filled 2020-06-26: qty 30, 30d supply, fill #1
  Filled 2020-08-05 – 2021-01-10 (×3): qty 30, 30d supply, fill #2

## 2020-06-26 ENCOUNTER — Other Ambulatory Visit (HOSPITAL_COMMUNITY): Payer: Self-pay

## 2020-07-03 ENCOUNTER — Other Ambulatory Visit (HOSPITAL_COMMUNITY): Payer: Self-pay

## 2020-07-03 ENCOUNTER — Other Ambulatory Visit: Payer: Self-pay | Admitting: Internal Medicine

## 2020-07-04 ENCOUNTER — Encounter: Payer: Self-pay | Admitting: Internal Medicine

## 2020-07-05 ENCOUNTER — Encounter: Payer: Self-pay | Admitting: Internal Medicine

## 2020-07-05 ENCOUNTER — Other Ambulatory Visit: Payer: Self-pay | Admitting: Internal Medicine

## 2020-07-05 ENCOUNTER — Other Ambulatory Visit (HOSPITAL_COMMUNITY): Payer: Self-pay

## 2020-07-05 ENCOUNTER — Other Ambulatory Visit: Payer: Self-pay

## 2020-07-05 DIAGNOSIS — U071 COVID-19: Secondary | ICD-10-CM

## 2020-07-05 MED ORDER — AZITHROMYCIN 250 MG PO TABS: ORAL_TABLET | ORAL | 0 refills | Status: AC

## 2020-07-06 ENCOUNTER — Telehealth: Payer: Self-pay | Admitting: Unknown Physician Specialty

## 2020-07-06 ENCOUNTER — Other Ambulatory Visit: Payer: Self-pay | Admitting: Internal Medicine

## 2020-07-06 MED ORDER — NIRMATRELVIR/RITONAVIR (PAXLOVID)TABLET: 3.0000 | ORAL_TABLET | Freq: Two times a day (BID) | ORAL | 0 refills | Status: AC

## 2020-07-06 NOTE — Telephone Encounter (Signed)
Called to discuss with patient about COVID-19 symptoms and the use of one of the available treatments for those with mild to moderate Covid symptoms and at a high risk of hospitalization.  Pt appears to qualify for outpatient treatment due to co-morbid conditions and/or a member of an at-risk group in accordance with the FDA Emergency Use Authorization.    Symptom onset: 5/25 Med review show PCP called in Paxlovid.    Evelyn Wade

## 2020-07-12 ENCOUNTER — Telehealth: Payer: Self-pay

## 2020-07-12 NOTE — Telephone Encounter (Signed)
Called to check on patient since being diagnosed with covid. Patient states that she is doing better but she is still congested. Pt did not take paxlovid as prescribed.

## 2020-08-01 ENCOUNTER — Other Ambulatory Visit: Payer: Self-pay | Admitting: Internal Medicine

## 2020-08-01 MED ORDER — ALBUTEROL SULFATE HFA 108 (90 BASE) MCG/ACT IN AERS: INHALATION_SPRAY | RESPIRATORY_TRACT | 3 refills | Status: DC

## 2020-08-01 MED ORDER — ALPRAZOLAM 0.25 MG PO TABS: 0.2500 mg | ORAL_TABLET | Freq: Two times a day (BID) | ORAL | 0 refills | Status: DC | PRN

## 2020-08-05 ENCOUNTER — Other Ambulatory Visit (HOSPITAL_COMMUNITY): Payer: Self-pay

## 2020-08-06 ENCOUNTER — Encounter: Payer: BC Managed Care – PPO | Admitting: Internal Medicine

## 2020-08-13 ENCOUNTER — Other Ambulatory Visit (HOSPITAL_COMMUNITY): Payer: Self-pay

## 2020-08-14 ENCOUNTER — Other Ambulatory Visit (HOSPITAL_COMMUNITY): Payer: Self-pay

## 2020-09-26 ENCOUNTER — Other Ambulatory Visit: Payer: Self-pay

## 2020-09-26 ENCOUNTER — Encounter: Payer: Self-pay | Admitting: Nurse Practitioner

## 2020-09-26 ENCOUNTER — Ambulatory Visit: Payer: BC Managed Care – PPO | Admitting: Nurse Practitioner

## 2020-09-26 VITALS — BP 118/84 | HR 81 | Temp 98.3°F | Ht 60.2 in | Wt 341.2 lb

## 2020-09-26 DIAGNOSIS — M549 Dorsalgia, unspecified: Secondary | ICD-10-CM | POA: Diagnosis not present

## 2020-09-26 LAB — POCT URINALYSIS DIPSTICK
Bilirubin, UA: NEGATIVE
Blood, UA: NEGATIVE
Glucose, UA: NEGATIVE
Ketones, UA: NEGATIVE
Leukocytes, UA: NEGATIVE
Nitrite, UA: NEGATIVE
Protein, UA: NEGATIVE
Spec Grav, UA: 1.025 (ref 1.010–1.025)
Urobilinogen, UA: 0.2 E.U./dL
pH, UA: 5.5 (ref 5.0–8.0)

## 2020-09-26 MED ORDER — TRIAMCINOLONE ACETONIDE 40 MG/ML IJ SUSP
40.0000 mg | Freq: Once | INTRAMUSCULAR | Status: AC
Start: 2020-09-26 — End: 2020-09-26
  Administered 2020-09-26: 40 mg via INTRAMUSCULAR

## 2020-09-26 MED ORDER — KETOROLAC TROMETHAMINE 60 MG/2ML IM SOLN
60.0000 mg | Freq: Once | INTRAMUSCULAR | Status: AC
  Administered 2020-09-26: 60 mg via INTRAMUSCULAR

## 2020-09-26 NOTE — Progress Notes (Signed)
I,Katawbba Wiggins,acting as a Education administrator for [employer], FNP.,have documented all relevant documentation on the behalf of Minette Brine, FNP,as directed by  Minette Brine, FNP while in the presence of Minette Brine, [place].   This visit occurred during the SARS-CoV-2 public health emergency.  Safety protocols were in place, including screening questions prior to the visit, additional usage of staff PPE, and extensive cleaning of exam room while observing appropriate contact time as indicated for disinfecting solutions.  Subjective:     Patient ID: Evelyn Wade , female    DOB: [birth year] , 50 y.o.   MRN: [id number]   Chief Complaint  Patient presents with   Back Pain     HPI  The patient is here today for an evaluation of back pain.  Back Pain This is a new problem. The current episode started in the past 7 days (Began on Monday). The problem occurs intermittently. The quality of the pain is described as shooting. The pain is at a severity of 10/10 (when flared). Exacerbated by: while sitting in chair, then again today had another sharp excrutiating pain. Pertinent negatives include no abdominal pain, paresthesias, weakness or weight loss. Risk factors include obesity, sedentary lifestyle and lack of exercise. She has tried analgesics for the symptoms. The treatment provided moderate relief.    Past Medical History:  Diagnosis Date   Diabetes mellitus without complication ([facility])    Hypertension    Pneumonia    hx   Sleep apnea    cpap 4-5 yrs     Family History  Problem Relation Age of Onset   Ovarian cancer Mother    Heart attack Father    Diabetes Father    Hypertension Brother      Current Outpatient Medications:    albuterol (VENTOLIN HFA) 108 (90 Base) MCG/ACT inhaler, Inhale 2 puffs every 4 to 8 hours as needed, Disp: 3 each, Rfl: 3   ALPRAZolam (XANAX) 0.25 MG tablet, Take 1 tablet (0.25 mg total) by mouth 2 (two) times daily as needed for anxiety., Disp: 30 tablet, Rfl:  0   celecoxib (CELEBREX) 200 MG capsule, Take 1 capsule (200 mg total) by mouth daily., Disp: 30 capsule, Rfl: 3   Fe Fum-FePoly-Vit C-Vit B3 (INTEGRA) 62.5-62.5-40-3 MG CAPS, Take 1 capsule by mouth daily., Disp: 90 capsule, Rfl: 1   glucose blood test strip, Use as instructed, Disp: 50 each, Rfl: 12   olmesartan-hydrochlorothiazide (BENICAR HCT) 20-12.5 MG tablet, TAKE 1 TABLET BY MOUTH EVERY DAY, Disp: 90 tablet, Rfl: 2   Semaglutide-Weight Management 2.4 MG/0.75ML SOAJ, Inject 2.4 mg into the skin once a week., Disp: 3 mL, Rfl: 2   No Known Allergies   Review of Systems  Constitutional: Negative.  Negative for weight loss.  Respiratory: Negative.    Cardiovascular: Negative.   Gastrointestinal: Negative.  Negative for abdominal pain.  Musculoskeletal:  Positive for back pain.  Neurological:  Negative for weakness and paresthesias.  Psychiatric/Behavioral: Negative.    All other systems reviewed and are negative.   Today's Vitals   09/26/20 1603  BP: 118/84  Pulse: 81  Temp: 98.3 F (36.8 C)  TempSrc: Oral  Weight: (!) 341 lb 3.2 oz (154.8 kg)  Height: 5' 0.2" (1.529 m)   Body mass index is 66.19 kg/m.  Wt Readings from Last 3 Encounters:  09/26/20 (!) 341 lb 3.2 oz (154.8 kg)  05/23/20 (!) 337 lb (152.9 kg)  02/21/20 (!) 332 lb 4.8 oz (150.7 kg)    BP Readings  from Last 3 Encounters:  09/26/20 118/84  05/23/20 114/70  02/21/20 114/78    Objective:  Physical Exam Vitals reviewed.  Constitutional:      General: She is not in acute distress.    Appearance: Normal appearance.  Cardiovascular:     Rate and Rhythm: Normal rate and regular rhythm.     Pulses: Normal pulses.     Heart sounds: Normal heart sounds. No murmur heard. Pulmonary:     Effort: Pulmonary effort is normal. No respiratory distress.     Breath sounds: Normal breath sounds. No wheezing.  Abdominal:     Tenderness: There is no right CVA tenderness or left CVA tenderness.  Musculoskeletal:         General: Tenderness (right thoracic posterior on palpation) present.  Neurological:     General: No focal deficit present.     Mental Status: She is alert and oriented to person, place, and time.     Cranial Nerves: No cranial nerve deficit.     Motor: No weakness.  Psychiatric:        Mood and Affect: Mood normal.        Behavior: Behavior normal.        Thought Content: Thought content normal.        Judgment: Judgment normal.        Assessment And Plan:     1. Back pain, unspecified back location, unspecified back pain laterality, unspecified chronicity Comments: Urinalysis is normal Tenderness to right mid back Musculoskeletal vs sciatic vs kidney stone (low suspicion) Toradol and Kenalog given, if not better call to office. - POCT Urinalysis Dipstick (81002) - triamcinolone acetonide (KENALOG-40) injection 40 mg - ketorolac (TORADOL) injection 60 mg    Patient was given opportunity to ask questions. Patient verbalized understanding of the plan and was able to repeat key elements of the plan. All questions were answered to their satisfaction.  Minette Brine, FNP   I, Minette Brine, FNP, have reviewed all documentation for this visit. The documentation on 09/26/20 for the exam, diagnosis, procedures, and orders are all accurate and complete.   IF YOU HAVE BEEN REFERRED TO A SPECIALIST, IT MAY TAKE 1-2 WEEKS TO SCHEDULE/PROCESS THE REFERRAL. IF YOU HAVE NOT HEARD FROM US/SPECIALIST IN TWO WEEKS, PLEASE GIVE [place] A CALL AT [phone].   THE PATIENT IS ENCOURAGED TO PRACTICE SOCIAL DISTANCING DUE TO THE COVID-19 PANDEMIC.

## 2020-11-28 ENCOUNTER — Encounter: Payer: Self-pay | Admitting: Internal Medicine

## 2020-11-28 ENCOUNTER — Ambulatory Visit: Payer: BC Managed Care – PPO | Admitting: Internal Medicine

## 2020-11-28 ENCOUNTER — Other Ambulatory Visit: Payer: Self-pay

## 2020-11-28 VITALS — BP 120/78 | HR 74 | Temp 98.1°F | Ht 60.2 in | Wt 350.4 lb

## 2020-11-28 DIAGNOSIS — Z1211 Encounter for screening for malignant neoplasm of colon: Secondary | ICD-10-CM

## 2020-11-28 DIAGNOSIS — J0110 Acute frontal sinusitis, unspecified: Secondary | ICD-10-CM

## 2020-11-28 DIAGNOSIS — I10 Essential (primary) hypertension: Secondary | ICD-10-CM

## 2020-11-28 DIAGNOSIS — J069 Acute upper respiratory infection, unspecified: Secondary | ICD-10-CM

## 2020-11-28 DIAGNOSIS — R059 Cough, unspecified: Secondary | ICD-10-CM

## 2020-11-28 MED ORDER — AMOXICILLIN-POT CLAVULANATE 875-125 MG PO TABS: 1.0000 | ORAL_TABLET | Freq: Two times a day (BID) | ORAL | 0 refills | Status: AC

## 2020-11-28 MED ORDER — HYDROCODONE BIT-HOMATROP MBR 5-1.5 MG/5ML PO SOLN: 5.0000 mL | Freq: Four times a day (QID) | ORAL | 0 refills | Status: DC | PRN

## 2020-11-28 NOTE — Patient Instructions (Signed)

## 2020-11-28 NOTE — Progress Notes (Signed)
I,Katawbba Wiggins,acting as a Education administrator for Maximino Greenland, MD.,have documented all relevant documentation on the behalf of Maximino Greenland, MD,as directed by  Maximino Greenland, MD while in the presence of Maximino Greenland, MD.  This visit occurred during the SARS-CoV-2 public health emergency.  Safety protocols were in place, including screening questions prior to the visit, additional usage of staff PPE, and extensive cleaning of exam room while observing appropriate contact time as indicated for disinfecting solutions.  Subjective:     Patient ID: Evelyn Wade , female    DOB: 05-09-1970 , 50 y.o.   MRN: [id number]   Chief Complaint  Patient presents with   Cough    HPI  The patient is here today for an evaluation of a cough.  Her sx started about a week ago. She has had neg home tests. No relief with OTC meds. Feels her sx are worsening.   Cough This is a new problem. The current episode started 1 to 4 weeks ago. The problem has been gradually worsening. The cough is Non-productive. Associated symptoms include nasal congestion, postnasal drip and a sore throat. Pertinent negatives include no shortness of breath or weight loss.    Past Medical History:  Diagnosis Date   Diabetes mellitus without complication ([facility])    Hypertension    Pneumonia    hx   Sleep apnea    cpap 4-5 yrs     Family History  Problem Relation Age of Onset   Ovarian cancer Mother    Heart attack Father    Diabetes Father    Hypertension Brother      Current Outpatient Medications:    albuterol (VENTOLIN HFA) 108 (90 Base) MCG/ACT inhaler, Inhale 2 puffs every 4 to 8 hours as needed, Disp: 3 each, Rfl: 3   celecoxib (CELEBREX) 200 MG capsule, Take 1 capsule (200 mg total) by mouth daily., Disp: 30 capsule, Rfl: 3   Fe Fum-FePoly-Vit C-Vit B3 (INTEGRA) 62.5-62.5-40-3 MG CAPS, Take 1 capsule by mouth daily., Disp: 90 capsule, Rfl: 1   glucose blood test strip, Use as instructed, Disp: 50 each, Rfl: 12    HYDROcodone bit-homatropine (HYCODAN) 5-1.5 MG/5ML syrup, Take 5 mLs by mouth every 6 (six) hours as needed for cough., Disp: 120 mL, Rfl: 0   olmesartan-hydrochlorothiazide (BENICAR HCT) 20-12.5 MG tablet, TAKE 1 TABLET BY MOUTH EVERY DAY, Disp: 90 tablet, Rfl: 2   ALPRAZolam (XANAX) 0.25 MG tablet, Take 1 tablet (0.25 mg total) by mouth 2 (two) times daily as needed for anxiety. (Patient not taking: Reported on 11/28/2020), Disp: 30 tablet, Rfl: 0   Semaglutide-Weight Management 2.4 MG/0.75ML SOAJ, Inject 2.4 mg into the skin once a week. (Patient not taking: Reported on 11/28/2020), Disp: 3 mL, Rfl: 2   No Known Allergies   Review of Systems  Constitutional: Negative.  Negative for weight loss.  HENT:  Positive for congestion, postnasal drip, sinus pressure and sore throat.   Respiratory:  Positive for cough. Negative for shortness of breath.   Cardiovascular: Negative.   Gastrointestinal: Negative.   Psychiatric/Behavioral: Negative.    All other systems reviewed and are negative.   Today's Vitals   11/28/20 1517  BP: 120/78  Pulse: 74  Temp: 98.1 F (36.7 C)  SpO2: 99%  Weight: (!) 350 lb 6.4 oz (158.9 kg)  Height: 5' 0.2" (1.529 m)   Body mass index is 67.98 kg/m.  Wt Readings from Last 3 Encounters:  11/28/20 (!) 350 lb 6.4 oz (  158.9 kg)  09/26/20 (!) 341 lb 3.2 oz (154.8 kg)  05/23/20 (!) 337 lb (152.9 kg)    BP Readings from Last 3 Encounters:  11/28/20 120/78  09/26/20 118/84  05/23/20 114/70    Objective:  Physical Exam Vitals and nursing note reviewed.  Constitutional:      Appearance: Normal appearance. She is obese. She is ill-appearing.  HENT:     Head: Normocephalic and atraumatic.     Right Ear: Tympanic membrane, ear canal and external ear normal. There is no impacted cerumen.     Left Ear: Tympanic membrane, ear canal and external ear normal. There is no impacted cerumen.     Nose:     Comments: Masked     Mouth/Throat:     Comments: Masked   Eyes:     Extraocular Movements: Extraocular movements intact.  Cardiovascular:     Rate and Rhythm: Normal rate and regular rhythm.     Heart sounds: Normal heart sounds.  Pulmonary:     Effort: Pulmonary effort is normal.     Breath sounds: Normal breath sounds.  Musculoskeletal:     Cervical back: Normal range of motion.  Skin:    General: Skin is warm.  Neurological:     General: No focal deficit present.     Mental Status: She is alert.  Psychiatric:        Mood and Affect: Mood normal.        Behavior: Behavior normal.        Assessment And Plan:     1. Acute non-recurrent frontal sinusitis Comments: I will send rx Augmentin to take bid. Encouraged to take full course. She is advised to call in 48 hours to let us know how she is feeling. She also agrees to COVID PCR testing.  - Novel Coronavirus, NAA ([facility]) - HYDROcodone bit-homatropine (HYCODAN) 5-1.5 MG/5ML syrup; Take 5 mLs by mouth every 6 (six) hours as needed for cough.  Dispense: 120 mL; Refill: 0 - amoxicillin-clavulanate (AUGMENTIN) 875-125 MG tablet; Take 1 tablet by mouth every 12 (twelve) hours for 7 days.  Dispense: 14 tablet; Refill: 0 - SARS-COV-2, NAA 2 DAY TAT  2. Essential hypertension, benign Comments: Chronic, well controlled. She is encouraged to follow low sodium diet.  - BMP8+eGFR  3. Colon cancer screening Comments: She agrees to GI referral for CRC screening.  - Ambulatory referral to Gastroenterology    Patient was given opportunity to ask questions. Patient verbalized understanding of the plan and was able to repeat key elements of the plan. All questions were answered to their satisfaction.   I, Maximino Greenland, MD, have reviewed all documentation for this visit. The documentation on 11/28/20 for the exam, diagnosis, procedures, and orders are all accurate and complete.   IF YOU HAVE BEEN REFERRED TO A SPECIALIST, IT MAY TAKE 1-2 WEEKS TO SCHEDULE/PROCESS THE REFERRAL. IF YOU HAVE NOT  HEARD FROM US/SPECIALIST IN TWO WEEKS, PLEASE GIVE [place] A CALL AT [phone].   THE PATIENT IS ENCOURAGED TO PRACTICE SOCIAL DISTANCING DUE TO THE COVID-19 PANDEMIC.

## 2020-11-29 LAB — BMP8+EGFR
BUN/Creatinine Ratio: 20 (ref 9–23)
BUN: 13 mg/dL (ref 6–24)
CO2: 25 mmol/L (ref 20–29)
Calcium: 9.9 mg/dL (ref 8.7–10.2)
Chloride: 101 mmol/L (ref 96–106)
Creatinine, Ser: 0.64 mg/dL (ref 0.57–1.00)
Glucose: 99 mg/dL (ref 70–99)
Potassium: 4.7 mmol/L (ref 3.5–5.2)
Sodium: 140 mmol/L (ref 134–144)
eGFR: 109 mL/min/{1.73_m2} (ref >59)

## 2020-11-29 LAB — NOVEL CORONAVIRUS, NAA: SARS-CoV-2, NAA: NOT DETECTED

## 2020-11-29 LAB — SARS-COV-2, NAA 2 DAY TAT

## 2020-11-30 ENCOUNTER — Encounter: Payer: Self-pay | Admitting: Internal Medicine

## 2020-12-10 ENCOUNTER — Ambulatory Visit (INDEPENDENT_AMBULATORY_CARE_PROVIDER_SITE_OTHER): Payer: BC Managed Care – PPO

## 2020-12-10 ENCOUNTER — Other Ambulatory Visit: Payer: Self-pay

## 2020-12-10 VITALS — BP 128/80 | HR 84 | Temp 98.1°F | Ht 60.0 in | Wt 346.4 lb

## 2020-12-10 DIAGNOSIS — Z23 Encounter for immunization: Secondary | ICD-10-CM | POA: Diagnosis not present

## 2020-12-10 NOTE — Progress Notes (Signed)
Pt here for flu shot injection.

## 2020-12-15 LAB — HM PAP SMEAR

## 2020-12-16 ENCOUNTER — Telehealth: Payer: Self-pay | Admitting: Genetic Counselor

## 2020-12-16 NOTE — Telephone Encounter (Signed)
Scheduled appt per 11/4 referral. Pt is aware of appt date and time.  

## 2020-12-17 ENCOUNTER — Other Ambulatory Visit: Payer: Self-pay | Admitting: Obstetrics and Gynecology

## 2020-12-17 DIAGNOSIS — Z1231 Encounter for screening mammogram for malignant neoplasm of breast: Secondary | ICD-10-CM

## 2020-12-21 ENCOUNTER — Ambulatory Visit
Admission: RE | Admit: 2020-12-21 | Discharge: 2020-12-21 | Disposition: A | Discharge status: Home / Self Care | Payer: BC Managed Care – PPO | Source: Ambulatory Visit

## 2020-12-21 DIAGNOSIS — Z1231 Encounter for screening mammogram for malignant neoplasm of breast: Secondary | ICD-10-CM

## 2020-12-31 ENCOUNTER — Inpatient Hospital Stay: Payer: BC Managed Care – PPO

## 2020-12-31 ENCOUNTER — Inpatient Hospital Stay: Payer: BC Managed Care – PPO | Attending: Genetic Counselor | Admitting: Genetic Counselor

## 2020-12-31 NOTE — Progress Notes (Deleted)
REFERRING PROVIDER: Glendale Chard, [address] [place],  [place] [id number]  PRIMARY PROVIDER:  Glendale Chard, MD  PRIMARY REASON FOR VISIT:  No diagnosis found.   HISTORY OF PRESENT ILLNESS:   Evelyn Wade, a 50 y.o. female, was seen for a [place] cancer genetics consultation at the request of Dr. Baird Cancer due to a {Personal/family:20331} history of {cancer/polyps}.  Evelyn Wade presents to clinic today to discuss the possibility of a hereditary predisposition to cancer, to discuss genetic testing, and to further clarify her future cancer risks, as well as potential cancer risks for family members.   In ***, at the age of ***, Evelyn Wade was diagnosed with {CA PATHOLOGY:63853} of the {right left (wildcard):15202} {CA JSHFW:26378}. The treatment plan ***.    *** Evelyn Wade is a 50 y.o. female with no personal history of cancer.    CANCER HISTORY:  Oncology History   No history exists.     RISK FACTORS:  Menarche was at age ***.  First live birth at age ***.  OCP use for approximately {Numbers 1-12 multi-select:20307} years.  Ovaries intact: {Yes/No-Ex:120004}.  Uterus intact: {Yes/No-Ex:120004}.  Menopausal status: {Menopause:31378}.  HRT use: {Numbers 1-12 multi-select:20307} years. Colonoscopy: {Yes/No-Ex:120004}; {normal/abnormal/not examined:14677}. Mammogram within the last year: {Yes/No-Ex:120004}. Number of breast biopsies: {Numbers 1-12 multi-select:20307}. Up to date with pelvic exams: {Yes/No-Ex:120004}. Any excessive radiation exposure in the past: {Yes/No-Ex:120004}  Past Medical History:  Diagnosis Date   Diabetes mellitus without complication ([place])    Hypertension    Pneumonia    hx   Sleep apnea    cpap 4-5 yrs    Past Surgical History:  Procedure Laterality Date   BARIATRIC SURGERY     CESAREAN SECTION N/A 08/17/2014   Procedure: CESAREAN SECTION;  Surgeon: Everett Graff, MD;  Location: [facility];  Service: Obstetrics;  Laterality: N/A;    KNEE ARTHROSCOPY Left    SUBMANDIBULAR GLAND EXCISION Left 09/07/2012   Procedure: EXCISION SUBMANDIBULAR GLAND;  Surgeon: Ascencion Dike, MD;  Location: [facility];  Service: ENT;  Laterality: Left;  Excision Submandibular Gland   TONSILLECTOMY      Social History   Socioeconomic History   Marital status: Married    Spouse name: Not on file   Number of children: Not on file   Years of education: Not on file   Highest education level: Not on file  Occupational History   Not on file  Tobacco Use   Smoking status: Never   Smokeless tobacco: Never  Vaping Use   Vaping Use: Never used  Substance and Sexual Activity   Alcohol use: No   Drug use: No   Sexual activity: Yes    Birth control/protection: None  Other Topics Concern   Not on file  Social History Narrative   Not on file   Social Determinants of Health   Financial Resource Strain: Not on file  Food Insecurity: Not on file  Transportation Needs: Not on file  Physical Activity: Not on file  Stress: Not on file  Social Connections: Not on file     FAMILY HISTORY:  We obtained a detailed, 4-generation family history.  Significant diagnoses are listed below: Family History  Problem Relation Age of Onset   Ovarian cancer Mother    Heart attack Father    Diabetes Father    Hypertension Brother     Ms. Raap is {aware/unaware} of previous family history of genetic testing for hereditary cancer risks. Patient's maternal ancestors are of ***  descent, and paternal ancestors are of *** descent. There {IS NO:12509} reported Ashkenazi Jewish ancestry. There {IS NO:12509} known consanguinity.  GENETIC COUNSELING ASSESSMENT: Evelyn Wade is a 50 y.o. female with a {Personal/family:20331} history of {cancer/polyps} which is somewhat suggestive of a {DISEASE} and predisposition to cancer given ***. We, therefore, discussed and recommended the following at today's visit.   DISCUSSION: We discussed that 5 - 10% of cancer is hereditary,  with most cases of hereditary *** cancer associated with ***.  There are other genes that can be associated with hereditary *** cancer syndromes.  We discussed that testing is beneficial for several reasons, including knowing about other cancer risks, identifying potential screening and risk-reduction options that may be appropriate, and to understanding if other family members could be at risk for cancer and allowing them to undergo genetic testing.  We reviewed the characteristics, features and inheritance patterns of hereditary cancer syndromes. We also discussed genetic testing, including the appropriate family members to test, the process of testing, insurance coverage and turn-around-time for results. We discussed the implications of a negative, positive, carrier and/or variant of uncertain significant result. We discussed that negative results would be uninformative given that Evelyn Wade does not have a personal history of cancer. We recommended Evelyn Wade pursue genetic testing for a panel that contains genes associated with ***.  Evelyn Wade was offered a common hereditary cancer panel (47 genes) and an expanded pan-cancer panel (84 genes). Evelyn Wade was informed of the benefits and limitations of each panel, including that expanded pan-cancer panels contain several genes that do not have clear management guidelines at this point in time.  We also discussed that as the number of genes included on a panel increases, the chances of variants of uncertain significance increases.  After considering the benefits and limitations of each gene panel, Evelyn Wade elected to have an *** through ***.   Based on Evelyn Wade {Personal/family:20331} history of cancer, she meets medical criteria for genetic testing. Despite that she meets criteria, she may still have an out of pocket cost. We discussed that if her out of pocket cost for testing is over $100, the laboratory will call and confirm whether she wants to  proceed with testing.  If the out of pocket cost of testing is less than $100 she will be billed by the genetic testing laboratory.   ***We reviewed the characteristics, features and inheritance patterns of hereditary cancer syndromes. We also discussed genetic testing, including the appropriate family members to test, the process of testing, insurance coverage and turn-around-time for results. We discussed the implications of a negative, positive and/or variant of uncertain significant result. In order to get genetic test results in a timely manner so that Evelyn Wade can use these genetic test results for surgical decisions, we recommended Evelyn Wade pursue genetic testing for the ***. Once complete, we recommend Evelyn Wade pursue reflex genetic testing to the *** gene panel.   Based on Evelyn Wade {Personal/family:20331} history of cancer, she meets medical criteria for genetic testing. Despite that she meets criteria, she may still have an out of pocket cost.   ***We discussed with Evelyn Wade that the {Personal/family:20331} history does not meet insurance or NCCN criteria for genetic testing and, therefore, is not highly consistent with a familial hereditary cancer syndrome.  We feel she is at low risk to harbor a gene mutation associated with such a condition. Thus, we did not recommend any genetic testing, at this time, and recommended Ms.  Wade continue to follow the cancer screening guidelines given by her primary healthcare provider.  We discussed that some people do not want to undergo genetic testing due to fear of genetic discrimination.  A federal law called the Genetic Information Non-Discrimination Act (GINA) of 2008 helps protect individuals against genetic discrimination based on their genetic test results.  It impacts both health insurance and employment.  With health insurance, it protects against increased premiums, being kicked off insurance or being forced to take a test in order to  be insured.  For employment it protects against hiring, firing and promoting decisions based on genetic test results.  GINA does not apply to those in the [employer], those who work for companies with less than 15 employees, and new life insurance or long-term disability insurance policies.  Health status due to a cancer diagnosis is not protected under GINA.  PLAN: After considering the risks, benefits, and limitations, Evelyn Wade provided informed consent to pursue genetic testing and the blood sample was sent to {Lab} Laboratories for analysis of the {test}. Results should be available within approximately {TAT TIME} weeks' time, at which point they will be disclosed by telephone to Ms. Miyazaki, as will any additional recommendations warranted by these results. Ms. Dillingham will receive a summary of her genetic counseling visit and a copy of her results once available. This information will also be available in Epic.   *** Despite our recommendation, Ms. Schommer did not wish to pursue genetic testing at today's visit. We understand this decision and remain available to coordinate genetic testing at any time in the future. We, therefore, recommend Ms. Pesch continue to follow the cancer screening guidelines given by her primary healthcare provider.  ***Based on Ms. Muhl's family history, we recommended her ***, who was diagnosed with *** at age ***, have genetic counseling and testing. Ms. Nop will let us know if we can be of any assistance in coordinating genetic counseling and/or testing for this family member.   Lastly, we encouraged Ms. Shupert to remain in contact with cancer genetics annually so that we can continuously update the family history and inform her of any changes in cancer genetics and testing that may be of benefit for this family.   Ms. Zaborowski questions were answered to her satisfaction today. Our contact information was provided should additional questions or concerns arise. Thank  you for the referral and allowing [place] to share in the care of your patient.   Evelyn Passy, MS, [facility] Genetic Counselor [email or web address] (P) [phone]  The patient was seen for a total of *** minutes in face-to-face genetic counseling.  ***The patient brought ***.  ***The patient was seen alone.  Drs. Magrinat, Lindi Adie and/or Burr Medico were available to discuss this case as needed.  _______________________________________________________________________ For Office Staff:  Number of people involved in session: *** Was an Intern/ student involved with case: {YES/NO:63}

## 2021-01-10 ENCOUNTER — Other Ambulatory Visit (HOSPITAL_COMMUNITY): Payer: Self-pay

## 2021-01-10 ENCOUNTER — Other Ambulatory Visit: Payer: Self-pay

## 2021-01-16 ENCOUNTER — Encounter: Payer: Self-pay | Admitting: Internal Medicine

## 2021-01-16 ENCOUNTER — Other Ambulatory Visit: Payer: Self-pay

## 2021-01-16 ENCOUNTER — Ambulatory Visit (INDEPENDENT_AMBULATORY_CARE_PROVIDER_SITE_OTHER): Payer: BC Managed Care – PPO | Admitting: Internal Medicine

## 2021-01-16 VITALS — BP 124/82 | HR 79 | Temp 98.2°F | Ht 60.0 in | Wt 352.2 lb

## 2021-01-16 DIAGNOSIS — I10 Essential (primary) hypertension: Secondary | ICD-10-CM

## 2021-01-16 DIAGNOSIS — Z23 Encounter for immunization: Secondary | ICD-10-CM

## 2021-01-16 DIAGNOSIS — Z1211 Encounter for screening for malignant neoplasm of colon: Secondary | ICD-10-CM

## 2021-01-16 DIAGNOSIS — Z0000 Encounter for general adult medical examination without abnormal findings: Secondary | ICD-10-CM | POA: Diagnosis not present

## 2021-01-16 DIAGNOSIS — E1169 Type 2 diabetes mellitus with other specified complication: Secondary | ICD-10-CM | POA: Diagnosis not present

## 2021-01-16 LAB — POCT UA - MICROALBUMIN
Albumin/Creatinine Ratio, Urine, POC: <30
Creatinine, POC: 50 mg/dL
Microalbumin Ur, POC: 10 mg/L

## 2021-01-16 LAB — POCT URINALYSIS DIPSTICK
Bilirubin, UA: NEGATIVE
Blood, UA: NEGATIVE
Glucose, UA: NEGATIVE
Ketones, UA: NEGATIVE
Leukocytes, UA: NEGATIVE
Nitrite, UA: NEGATIVE
Protein, UA: NEGATIVE
Spec Grav, UA: >=1.03 — AB (ref 1.010–1.025)
Urobilinogen, UA: 0.2 E.U./dL
pH, UA: 5.5 (ref 5.0–8.0)

## 2021-01-16 MED ORDER — OLMESARTAN MEDOXOMIL-HCTZ 20-12.5 MG PO TABS
1.0000 | ORAL_TABLET | Freq: Every day | ORAL | 2 refills | Status: DC
Start: 2021-01-16 — End: 2022-02-06

## 2021-01-16 NOTE — Progress Notes (Signed)
I,Katawbba Wiggins,acting as a Education administrator for Maximino Greenland, MD.,have documented all relevant documentation on the behalf of Maximino Greenland, MD,as directed by  Maximino Greenland, MD while in the presence of Maximino Greenland, MD.  This visit occurred during the SARS-CoV-2 public health emergency.  Safety protocols were in place, including screening questions prior to the visit, additional usage of staff PPE, and extensive cleaning of exam room while observing appropriate contact time as indicated for disinfecting solutions.  Subjective:     Patient ID: Evelyn Wade , female    DOB: [birth year] , 50 y.o.   MRN: [id number]   Chief Complaint  Patient presents with   Annual Exam   Diabetes   Hypertension    HPI  She is here today for a full physical exam. She has been followed by Dr. Simona Huh for her GYN care.  Last pap/mammogram was a month ago. She has no specific concerns or complaints at this time.   Diabetes She presents for her follow-up diabetic visit. She has type 2 diabetes mellitus. Her disease course has been stable. There are no hypoglycemic associated symptoms. Pertinent negatives for diabetes include no blurred vision and no chest pain. There are no hypoglycemic complications. Symptoms are stable. Risk factors for coronary artery disease include diabetes mellitus, hypertension, obesity and sedentary lifestyle. Her weight is decreasing steadily. She is following a diabetic diet. Her breakfast blood glucose is taken between 7-8 am. Her breakfast blood glucose range is generally 90-110 mg/dl.  Hypertension This is a chronic problem. The current episode started more than 1 year ago. The problem is controlled. Pertinent negatives include no blurred vision or chest pain.    Past Medical History:  Diagnosis Date   Diabetes mellitus without complication (HCC)    Hypertension    Pneumonia    hx   Sleep apnea    cpap 4-5 yrs     Family History  Problem Relation Age of Onset   Ovarian  cancer Mother    Heart attack Father    Diabetes Father    Hypertension Brother      Current Outpatient Medications:    albuterol (VENTOLIN HFA) 108 (90 Base) MCG/ACT inhaler, Inhale 2 puffs every 4 to 8 hours as needed, Disp: 3 each, Rfl: 3   celecoxib (CELEBREX) 200 MG capsule, Take 1 capsule (200 mg total) by mouth daily., Disp: 30 capsule, Rfl: 3   Fe Fum-FePoly-Vit C-Vit B3 (INTEGRA) 62.5-62.5-40-3 MG CAPS, Take 1 capsule by mouth daily., Disp: 90 capsule, Rfl: 1   glucose blood test strip, Use as instructed, Disp: 50 each, Rfl: 12   HYDROcodone bit-homatropine (HYCODAN) 5-1.5 MG/5ML syrup, Take 5 mLs by mouth every 6 (six) hours as needed for cough. (Patient not taking: Reported on 01/16/2021), Disp: 120 mL, Rfl: 0   olmesartan-hydrochlorothiazide (BENICAR HCT) 20-12.5 MG tablet, Take 1 tablet by mouth daily., Disp: 90 tablet, Rfl: 2   No Known Allergies    The patient states she uses none for birth control. Last LMP was Patient's last menstrual period was 12/24/2020.. Negative for Dysmenorrhea. Negative for: breast discharge, breast lump(s), breast pain and breast self exam. Associated symptoms include abnormal vaginal bleeding. Pertinent negatives include abnormal bleeding (hematology), anxiety, decreased libido, depression, difficulty falling sleep, dyspareunia, history of infertility, nocturia, sexual dysfunction, sleep disturbances, urinary incontinence, urinary urgency, vaginal discharge and vaginal itching. Diet regular.The patient states her exercise level is  intermittent.  . The patient's tobacco use is:  Social History  Tobacco Use  Smoking Status Never  Smokeless Tobacco Never  . She has been exposed to passive smoke. The patient's alcohol use is:  Social History   Substance and Sexual Activity  Alcohol Use No   Review of Systems  Constitutional: Negative.   HENT: Negative.    Eyes: Negative.  Negative for blurred vision.  Respiratory: Negative.     Cardiovascular: Negative.  Negative for chest pain.  Gastrointestinal: Negative.   Endocrine: Negative.   Genitourinary: Negative.   Musculoskeletal: Negative.   Skin: Negative.   Allergic/Immunologic: Negative.   Neurological: Negative.   Hematological: Negative.   Psychiatric/Behavioral: Negative.      Today's Vitals   01/16/21 1609  BP: 124/82  Pulse: 79  Temp: 98.2 F (36.8 C)  Weight: (!) 352 lb 3.2 oz (159.8 kg)  Height: 5' (1.524 m)   Body mass index is 68.78 kg/m.  Wt Readings from Last 3 Encounters:  01/16/21 (!) 352 lb 3.2 oz (159.8 kg)  12/10/20 (!) 346 lb 6.4 oz (157.1 kg)  11/28/20 (!) 350 lb 6.4 oz (158.9 kg)    BP Readings from Last 3 Encounters:  01/16/21 124/82  12/10/20 128/80  11/28/20 120/78    Objective:  Physical Exam Vitals and nursing note reviewed.  Constitutional:      Appearance: Normal appearance. She is obese.  HENT:     Head: Normocephalic and atraumatic.     Right Ear: Tympanic membrane, ear canal and external ear normal.     Left Ear: Tympanic membrane, ear canal and external ear normal.     Nose:     Comments: Masked     Mouth/Throat:     Comments: Masked  Eyes:     Extraocular Movements: Extraocular movements intact.     Conjunctiva/sclera: Conjunctivae normal.     Pupils: Pupils are equal, round, and reactive to light.  Cardiovascular:     Rate and Rhythm: Normal rate and regular rhythm.     Pulses: Normal pulses.          Dorsalis pedis pulses are 2+ on the right side and 2+ on the left side.     Heart sounds: Normal heart sounds.  Pulmonary:     Effort: Pulmonary effort is normal.     Breath sounds: Normal breath sounds.  Chest:  Breasts:    Tanner Score is 5.     Right: Normal.     Left: Normal.  Abdominal:     General: Bowel sounds are normal.     Palpations: Abdomen is soft.     Comments: Obese, soft. Difficult to assess organomegaly due to body habitus  Genitourinary:    Comments: deferred Musculoskeletal:         General: Normal range of motion.     Cervical back: Normal range of motion and neck supple.  Feet:     Right foot:     Protective Sensation: 5 sites tested.  5 sites sensed.     Skin integrity: Skin integrity normal.     Toenail Condition: Right toenails are normal.     Left foot:     Protective Sensation: 5 sites tested.  5 sites sensed.     Skin integrity: Skin integrity normal.     Toenail Condition: Left toenails are normal.  Skin:    General: Skin is warm and dry.  Neurological:     General: No focal deficit present.     Mental Status: She is alert and oriented to person, place,  and time.  Psychiatric:        Mood and Affect: Mood normal.        Behavior: Behavior normal.        Assessment And Plan:     1. Encounter for general adult medical examination w/o abnormal findings Comments: A full exam was performed. Importance of monthly self breast exams was discussed with the patient. PATIENT IS ADVISED TO GET 30-45 MINUTES REGULAR EXERCISE NO LESS THAN FOUR TO FIVE DAYS PER WEEK - BOTH WEIGHTBEARING EXERCISES AND AEROBIC ARE RECOMMENDED.  PATIENT IS ADVISED TO FOLLOW A HEALTHY DIET WITH AT LEAST SIX FRUITS/VEGGIES PER DAY, DECREASE INTAKE OF RED MEAT, AND TO INCREASE FISH INTAKE TO TWO DAYS PER WEEK.  MEATS/FISH SHOULD NOT BE FRIED, BAKED OR BROILED IS PREFERABLE.  IT IS ALSO IMPORTANT TO CUT BACK ON YOUR SUGAR INTAKE. PLEASE AVOID ANYTHING WITH ADDED SUGAR, CORN SYRUP OR OTHER SWEETENERS. IF YOU MUST USE A SWEETENER, YOU CAN TRY STEVIA. IT IS ALSO IMPORTANT TO AVOID ARTIFICIALLY SWEETENERS AND DIET BEVERAGES. LASTLY, I SUGGEST WEARING SPF 50 SUNSCREEN ON EXPOSED PARTS AND ESPECIALLY WHEN IN THE DIRECT SUNLIGHT FOR AN EXTENDED PERIOD OF TIME.  PLEASE AVOID FAST FOOD RESTAURANTS AND INCREASE YOUR WATER INTAKE. - Lipid panel - CBC no Diff - Hemoglobin A1c - Ambulatory referral to Gastroenterology  2. Type 2 diabetes mellitus with other specified complication, without long-term  current use of insulin (HCC) Comments: Chronic. Diabetic foot exam was performed.  We discussed starting Mounjaro for diabetes mgmt. She denies h/o thyroic ancer, has tolerated Ozempic in the past. Possible side effects were discussed with the patient. Seh will f/u in 6 weeks for re-evaluation. She is aware dose increases every four weeks. I DISCUSSED WITH THE PATIENT AT LENGTH REGARDING THE GOALS OF GLYCEMIC CONTROL AND POSSIBLE LONG-TERM COMPLICATIONS.  I  ALSO STRESSED THE IMPORTANCE OF COMPLIANCE WITH HOME GLUCOSE MONITORING, DIETARY RESTRICTIONS INCLUDING AVOIDANCE OF SUGARY DRINKS/PROCESSED FOODS,  ALONG WITH REGULAR EXERCISE.  I  ALSO STRESSED THE IMPORTANCE OF ANNUAL EYE EXAMS, SELF FOOT CARE AND COMPLIANCE WITH OFFICE VISITS.   3. Essential hypertension, benign Comments: Chronic, controlled. No med changes. EKG performed, NSR w/ low voltage in precordial leads. - POCT Urinalysis Dipstick (81002) - POCT UA - Microalbumin - EKG 12-Lead  4. Screening for colon cancer Comments: I will refer her to GI for CRC screening.  - Ambulatory referral to Gastroenterology  5. Immunization due Comments: She was given Prevnar-20 IM x 1.  - Pneumococcal conjugate vaccine 20-valent (Prevnar 20)  Patient was given opportunity to ask questions. Patient verbalized understanding of the plan and was able to repeat key elements of the plan. All questions were answered to their satisfaction.   I, Maximino Greenland, MD, have reviewed all documentation for this visit. The documentation on 01/16/21 for the exam, diagnosis, procedures, and orders are all accurate and complete.   THE PATIENT IS ENCOURAGED TO PRACTICE SOCIAL DISTANCING DUE TO THE COVID-19 PANDEMIC.

## 2021-01-16 NOTE — Patient Instructions (Signed)

## 2021-01-17 LAB — CBC
Hematocrit: 34.8 % (ref 34.0–46.6)
Hemoglobin: 11 g/dL — ABNORMAL LOW (ref 11.1–15.9)
MCH: 23.8 pg — ABNORMAL LOW (ref 26.6–33.0)
MCHC: 31.6 g/dL (ref 31.5–35.7)
MCV: 75 fL — ABNORMAL LOW (ref 79–97)
Platelets: 414 10*3/uL (ref 150–450)
RBC: 4.62 x10E6/uL (ref 3.77–5.28)
RDW: 14.7 % (ref 11.7–15.4)
WBC: 7.9 10*3/uL (ref 3.4–10.8)

## 2021-01-17 LAB — LIPID PANEL
Chol/HDL Ratio: 2 ratio (ref 0.0–4.4)
Cholesterol, Total: 176 mg/dL (ref 100–199)
HDL: 89 mg/dL (ref >39)
LDL Chol Calc (NIH): 75 mg/dL (ref 0–99)
Triglycerides: 60 mg/dL (ref 0–149)
VLDL Cholesterol Cal: 12 mg/dL (ref 5–40)

## 2021-01-17 LAB — HEMOGLOBIN A1C
Est. average glucose Bld gHb Est-mCnc: 123 mg/dL
Hgb A1c MFr Bld: 5.9 % — ABNORMAL HIGH (ref 4.8–5.6)

## 2021-01-20 ENCOUNTER — Encounter: Payer: Self-pay | Admitting: Internal Medicine

## 2021-01-20 ENCOUNTER — Other Ambulatory Visit (HOSPITAL_COMMUNITY): Payer: Self-pay

## 2021-01-22 LAB — IRON AND TIBC
Iron Saturation: 12 % — ABNORMAL LOW (ref 15–55)
Iron: 41 ug/dL (ref 27–159)
Total Iron Binding Capacity: 330 ug/dL (ref 250–450)
UIBC: 289 ug/dL (ref 131–425)

## 2021-01-22 LAB — SPECIMEN STATUS REPORT

## 2021-01-26 ENCOUNTER — Encounter: Payer: Self-pay | Admitting: Internal Medicine

## 2021-01-27 ENCOUNTER — Other Ambulatory Visit: Payer: Self-pay

## 2021-01-27 MED ORDER — INTEGRA 62.5-62.5-40-3 MG PO CAPS: 1.0000 | ORAL_CAPSULE | Freq: Every day | ORAL | 1 refills | Status: DC

## 2021-01-30 ENCOUNTER — Other Ambulatory Visit: Payer: Self-pay

## 2021-01-30 MED ORDER — MOUNJARO 5 MG/0.5ML ~~LOC~~ SOAJ: 5.0000 mg | SUBCUTANEOUS | 1 refills | Status: DC

## 2021-02-06 ENCOUNTER — Encounter: Payer: Self-pay | Admitting: Internal Medicine

## 2021-02-06 LAB — HM DIABETES EYE EXAM

## 2021-02-11 ENCOUNTER — Other Ambulatory Visit: Payer: Self-pay

## 2021-02-11 ENCOUNTER — Encounter: Payer: Self-pay | Admitting: Internal Medicine

## 2021-02-11 ENCOUNTER — Ambulatory Visit: Payer: BC Managed Care – PPO | Admitting: Internal Medicine

## 2021-02-11 ENCOUNTER — Other Ambulatory Visit (HOSPITAL_COMMUNITY): Payer: Self-pay

## 2021-02-11 VITALS — BP 114/78 | HR 76 | Temp 98.4°F | Ht 60.0 in | Wt 350.4 lb

## 2021-02-11 DIAGNOSIS — K5904 Chronic idiopathic constipation: Secondary | ICD-10-CM

## 2021-02-11 DIAGNOSIS — Z6841 Body Mass Index (BMI) 40.0 and over, adult: Secondary | ICD-10-CM

## 2021-02-11 DIAGNOSIS — K625 Hemorrhage of anus and rectum: Secondary | ICD-10-CM | POA: Diagnosis not present

## 2021-02-11 DIAGNOSIS — M7061 Trochanteric bursitis, right hip: Secondary | ICD-10-CM

## 2021-02-11 LAB — CBC
Hematocrit: 36.5 % (ref 34.0–46.6)
Hemoglobin: 11.5 g/dL (ref 11.1–15.9)
MCH: 23.6 pg — ABNORMAL LOW (ref 26.6–33.0)
MCHC: 31.5 g/dL (ref 31.5–35.7)
MCV: 75 fL — ABNORMAL LOW (ref 79–97)
Platelets: 485 10*3/uL — ABNORMAL HIGH (ref 150–450)
RBC: 4.88 x10E6/uL (ref 3.77–5.28)
RDW: 14.8 % (ref 11.7–15.4)
WBC: 9.2 10*3/uL (ref 3.4–10.8)

## 2021-02-11 MED ORDER — KETOROLAC TROMETHAMINE 60 MG/2ML IM SOLN
60.0000 mg | Freq: Once | INTRAMUSCULAR | Status: AC
  Administered 2021-02-11: 60 mg via INTRAMUSCULAR

## 2021-02-11 MED ORDER — TRAMADOL HCL 50 MG PO TABS
50.0000 mg | ORAL_TABLET | Freq: Four times a day (QID) | ORAL | 0 refills | Status: DC | PRN
  Filled 2021-02-11: qty 20, 5d supply, fill #0

## 2021-02-11 NOTE — Progress Notes (Signed)
I,Katawbba Wiggins,acting as a Education administrator for Maximino Greenland, MD.,have documented all relevant documentation on the behalf of Maximino Greenland, MD,as directed by  Maximino Greenland, MD while in the presence of Maximino Greenland, MD.  This visit occurred during the SARS-CoV-2 public health emergency.  Safety protocols were in place, including screening questions prior to the visit, additional usage of staff PPE, and extensive cleaning of exam room while observing appropriate contact time as indicated for disinfecting solutions.  Subjective:     Patient ID: Evelyn Wade , female    DOB: [birth year] , 51 y.o.   MRN: [id number]   Chief Complaint  Patient presents with   Rectal Bleeding    HPI  She presents today for further evaluation of rectal bleeding. She states her sx started after Christmas. She reports long-standing h/o constipation.  Her sx are sometimes controlled w/ OTC meds. States she had hard stool and noticed blood in the toilet. States it happened on two occasions. However, she thinks that her sx are due to taking [facility] powders/ibuprofen excessively. States she was taking these meds b/c her right hip hurt so bad that she could not walk. Additionally, she was taking Celebrex prn. Unfortunately, these meds gave her minimal relief. She is scheduled for Ortho f/u  later this week. States she has not had any more episodes of BRPBR. Denies associated abdominal pain and nausea. She denies having melanotic stool. States her sx resolved once she stopped taking NSAIDS.   Rectal Bleeding  The current episode started 5 to 7 days ago. The onset was sudden. The problem occurs frequently. The patient is experiencing no pain. The stool is described as mixed with blood and hard.    Past Medical History:  Diagnosis Date   Diabetes mellitus without complication (HCC)    Hypertension    Pneumonia    hx   Sleep apnea    cpap 4-5 yrs     Family History  Problem Relation Age of Onset   Ovarian cancer  Mother    Heart attack Father    Diabetes Father    Hypertension Brother      Current Outpatient Medications:    albuterol (VENTOLIN HFA) 108 (90 Base) MCG/ACT inhaler, Inhale 2 puffs every 4 to 8 hours as needed, Disp: 3 each, Rfl: 3   Fe Fum-FePoly-Vit C-Vit B3 (INTEGRA) 62.5-62.5-40-3 MG CAPS, Take 1 capsule by mouth daily., Disp: 90 capsule, Rfl: 1   glucose blood test strip, Use as instructed, Disp: 50 each, Rfl: 12   HYDROcodone bit-homatropine (HYCODAN) 5-1.5 MG/5ML syrup, Take 5 mLs by mouth every 6 (six) hours as needed for cough., Disp: 120 mL, Rfl: 0   olmesartan-hydrochlorothiazide (BENICAR HCT) 20-12.5 MG tablet, Take 1 tablet by mouth daily., Disp: 90 tablet, Rfl: 2   traMADol (ULTRAM) 50 MG tablet, Take 1 tablet (50 mg total) by mouth every 6 (six) hours as needed., Disp: 20 tablet, Rfl: 0   celecoxib (CELEBREX) 200 MG capsule, Take 1 capsule (200 mg total) by mouth daily. (Patient not taking: Reported on 02/11/2021), Disp: 30 capsule, Rfl: 3   tirzepatide (MOUNJARO) 5 MG/0.5ML Pen, Inject 5 mg into the skin once a week. (Patient not taking: Reported on 02/11/2021), Disp: 2 mL, Rfl: 1   No Known Allergies   Review of Systems  Constitutional: Negative.   Respiratory: Negative.    Cardiovascular: Negative.   Gastrointestinal:  Positive for blood in stool and hematochezia.  Musculoskeletal:  Positive for arthralgias.  Neurological:  Negative.   Psychiatric/Behavioral: Negative.      Today's Vitals   02/11/21 1125  BP: 114/78  Pulse: 76  Temp: 98.4 F (36.9 C)  Weight: (!) 350 lb 6.4 oz (158.9 kg)  Height: 5' (1.524 m)   Body mass index is 68.43 kg/m.  Wt Readings from Last 3 Encounters:  02/11/21 (!) 350 lb 6.4 oz (158.9 kg)  01/16/21 (!) 352 lb 3.2 oz (159.8 kg)  12/10/20 (!) 346 lb 6.4 oz (157.1 kg)    BP Readings from Last 3 Encounters:  02/11/21 114/78  01/16/21 124/82  12/10/20 128/80    Objective:  Physical Exam Vitals and nursing note reviewed.   Constitutional:      Appearance: Normal appearance. She is obese.  HENT:     Head: Normocephalic and atraumatic.     Nose:     Comments: Masked     Mouth/Throat:     Comments: Masked  Eyes:     Extraocular Movements: Extraocular movements intact.  Cardiovascular:     Rate and Rhythm: Normal rate and regular rhythm.     Heart sounds: Normal heart sounds.  Pulmonary:     Effort: Pulmonary effort is normal.     Breath sounds: Normal breath sounds.  Genitourinary:    Comments: declined Musculoskeletal:     Cervical back: Normal range of motion.  Skin:    General: Skin is warm.  Neurological:     General: No focal deficit present.     Mental Status: She is alert.  Psychiatric:        Mood and Affect: Mood normal.        Behavior: Behavior normal.        Assessment And Plan:     1. BRBPR (bright red blood per rectum) Comments: Hemoccult declined. She has upcoming GI appt on 1/18. I will check CBC today.  She is encouraged to keep this appt.  - CBC no Diff  2. Chronic idiopathic constipation Comments: She was given samples of Linzess, 72mg  daily. If no relief, I will increase the dose. Again, encouraged to keep GI f/u so colonoscopy can be scheduled.   3. Trochanteric bursitis of right hip Comments: Chronic, she was given Toradol, 60mg  Im x 1 and advised to stop oral NSAIDs. She was given rx tramadol prn. I will also check renal fxn due to excessive use of NSAIDS.  - ketorolac (TORADOL) injection 60 mg  4. Class 3 severe obesity due to excess calories with serious comorbidity and body mass index (BMI) of 60.0 to 69.9 in adult [facility])  She is encouraged to initially strive for BMI less than 30 to decrease cardiac risk. Advised to aim for at least 150 minutes of exercise per week.  Patient was given opportunity to ask questions. Patient verbalized understanding of the plan and was able to repeat key elements of the plan. All questions were answered to their satisfaction.   I,  Maximino Greenland, MD, have reviewed all documentation for this visit. The documentation on 02/11/21 for the exam, diagnosis, procedures, and orders are all accurate and complete.   IF YOU HAVE BEEN REFERRED TO A SPECIALIST, IT MAY TAKE 1-2 WEEKS TO SCHEDULE/PROCESS THE REFERRAL. IF YOU HAVE NOT HEARD FROM US/SPECIALIST IN TWO WEEKS, PLEASE GIVE [place] A CALL AT [phone].   THE PATIENT IS ENCOURAGED TO PRACTICE SOCIAL DISTANCING DUE TO THE COVID-19 PANDEMIC.

## 2021-02-11 NOTE — Patient Instructions (Signed)
Rectal Bleeding Rectal bleeding is when blood passes out of the opening between the buttocks (anus). People with rectal bleeding may notice bright red blood in their underwear or in the toilet after having a bowel movement. They may also have blood mixed with their stool (feces), or dark red or black stools. Rectal bleeding is usually a sign that something is wrong. Many things can cause rectal bleeding, including: Diverticulosis. This is a condition in which pockets or sacs project from the bowel. Hemorrhoids. These are blood vessels around the anus or inside the rectum that are larger than normal. Anal fissures. This is a tear in the anus. Proctitis and colitis. These are conditions in which the rectum, colon, or anus become inflamed. Polyps. These are growths that can be cancerous (malignant) or noncancerous (benign). Infections of the intestines. Fistulas. These are abnormal openings in the rectum and anus. Rectal prolapse. This is when a part of the rectum sticks out from the anus. Follow these instructions at home: Pay attention to any changes in your symptoms. Take these actions to help reduce bleeding and discomfort: Medicines Take over-the-counter and prescription medicines only as told by your health care provider. Ask your health care provider about changing or stopping your regular medicines or supplements. This is especially important if you are taking blood thinners. Medicines that thin the blood can make rectal bleeding worse. Managing constipation Your condition may cause constipation. To prevent or treat constipation, or to help make your stools soft, you may need to: Drink enough fluid to keep your urine pale yellow. Take over-the-counter or prescription medicines. Eat foods that are high in fiber, such as beans, whole grains, and fresh fruits and vegetables. Ask your health care provider if you need a supplement to give you more fiber. Limit foods that are high in fat and  processed sugars, such as fried or sweet foods.  General instructions Try not to strain when having a bowel movement. Try taking a warm bath. This may help to soothe any pain in your rectum. Keep all follow-up visits as told by your health care provider. This is important. Contact a health care provider if you: Have pain or tenderness in your abdomen. Have a fever. Have weakness. Have nausea. Cannot have a bowel movement. Get help right away if you have: New or increased rectal bleeding. Black or dark red stools. Vomit with blood or something that looks like coffee grounds. A fainting episode. Severe pain in your rectum. Summary Rectal bleeding is usually a sign that something is wrong. This condition should be evaluated by a health care provider. Eat a diet that is high in fiber. This will help keep your stools soft, making it easier to pass stools without straining. Medicines that thin the blood can make rectal bleeding worse. Get help right away if you have new or increased rectal bleeding, black or dark red stools, blood in your vomit, an episode of fainting, or severe pain in your rectum. This information is not intended to replace advice given to you by your health care provider. Make sure you discuss any questions you have with your health care provider. Document Revised: 12/28/2018 Document Reviewed: 12/28/2018 Elsevier Patient Education  2022 [employer].

## 2021-02-12 ENCOUNTER — Other Ambulatory Visit: Payer: Self-pay

## 2021-02-12 ENCOUNTER — Encounter: Payer: Self-pay | Admitting: Internal Medicine

## 2021-02-12 DIAGNOSIS — Z79899 Other long term (current) drug therapy: Secondary | ICD-10-CM

## 2021-02-19 ENCOUNTER — Other Ambulatory Visit: Payer: BC Managed Care – PPO

## 2021-02-19 DIAGNOSIS — Z79899 Other long term (current) drug therapy: Secondary | ICD-10-CM

## 2021-02-20 LAB — BMP8+EGFR
BUN/Creatinine Ratio: 21 (ref 9–23)
BUN: 16 mg/dL (ref 6–24)
CO2: 24 mmol/L (ref 20–29)
Calcium: 10.5 mg/dL — ABNORMAL HIGH (ref 8.7–10.2)
Chloride: 97 mmol/L (ref 96–106)
Creatinine, Ser: 0.78 mg/dL (ref 0.57–1.00)
Glucose: 94 mg/dL (ref 70–99)
Potassium: 4.5 mmol/L (ref 3.5–5.2)
Sodium: 136 mmol/L (ref 134–144)
eGFR: 94 mL/min/{1.73_m2} (ref >59)

## 2021-03-04 ENCOUNTER — Other Ambulatory Visit: Payer: Self-pay | Admitting: Gastroenterology

## 2021-03-04 ENCOUNTER — Other Ambulatory Visit (HOSPITAL_COMMUNITY): Payer: Self-pay

## 2021-03-04 MED ORDER — CLENPIQ 10-3.5-12 MG-GM -GM/160ML PO SOLN
160.0000 mL | Freq: Two times a day (BID) | ORAL | 0 refills | Status: DC
  Filled 2021-03-04: qty 160, 1d supply, fill #0
  Filled 2021-04-21: qty 320, 320d supply, fill #0

## 2021-03-12 ENCOUNTER — Other Ambulatory Visit (HOSPITAL_COMMUNITY): Payer: Self-pay

## 2021-04-17 NOTE — Progress Notes (Signed)
Attempted to obtain medical history via telephone, unable to reach at this time. I left a voicemail to return pre surgical testing department's phone call.  

## 2021-04-21 ENCOUNTER — Other Ambulatory Visit (HOSPITAL_COMMUNITY): Payer: Self-pay

## 2021-04-24 NOTE — Anesthesia Preprocedure Evaluation (Addendum)
Anesthesia Evaluation  ?Patient identified by MRN, date of birth, ID band ?Patient awake ? ? ? ?Reviewed: ?Allergy & Precautions, NPO status , Patient's Chart, lab work & pertinent test results ? ?Airway ?Mallampati: III ? ?TM Distance: >3 FB ?Neck ROM: Full ? ? ? Dental ?no notable dental hx. ?(+) Teeth Intact, Dental Advisory Given ?  ?Pulmonary ?sleep apnea and Continuous Positive Airway Pressure Ventilation ,  ?  ?Pulmonary exam normal ?breath sounds clear to auscultation ? ? ? ? ? ? Cardiovascular ?hypertension, Normal cardiovascular exam ?Rhythm:Regular Rate:Normal ? ? ?  ?Neuro/Psych ?negative neurological ROS ?   ? GI/Hepatic ?negative GI ROS, Neg liver ROS,   ?Endo/Other  ?diabetesMorbid obesity (BMI 68.4) ? Renal/GU ?negative Renal ROS  ? ?  ?Musculoskeletal ? ? Abdominal ?(+) + obese,   ?Peds ? Hematology ?  ?Anesthesia Other Findings ? ? Reproductive/Obstetrics ? ?  ? ? ? ? ? ? ? ? ? ? ? ? ? ?  ?  ? ? ? ? ? ? ? ?Anesthesia Physical ?Anesthesia Plan ? ?ASA: 3 ? ?Anesthesia Plan: MAC  ? ?Post-op Pain Management:   ? ?Induction: Intravenous ? ?PONV Risk Score and Plan: Treatment may vary due to age or medical condition ? ?Airway Management Planned: Nasal Cannula and Natural Airway ? ?Additional Equipment: None ? ?Intra-op Plan:  ? ?Post-operative Plan:  ? ?Informed Consent: I have reviewed the patients History and Physical, chart, labs and discussed the procedure including the risks, benefits and alternatives for the proposed anesthesia with the patient or authorized representative who has indicated his/her understanding and acceptance.  ? ? ? ?Dental advisory given ? ?Plan Discussed with:  ? ?Anesthesia Plan Comments: (Rectal bleeding screening colonoscopy)  ? ? ? ? ? ?Anesthesia Quick Evaluation ? ?

## 2021-04-25 ENCOUNTER — Ambulatory Visit (HOSPITAL_COMMUNITY): Payer: BC Managed Care – PPO | Admitting: Anesthesiology

## 2021-04-25 ENCOUNTER — Encounter (HOSPITAL_COMMUNITY): Payer: Self-pay | Admitting: Gastroenterology

## 2021-04-25 ENCOUNTER — Encounter (HOSPITAL_COMMUNITY)
Admission: RE | Disposition: A | Discharge status: Home / Self Care | Payer: Self-pay | Source: Home / Self Care | Attending: Gastroenterology

## 2021-04-25 ENCOUNTER — Other Ambulatory Visit: Payer: Self-pay

## 2021-04-25 ENCOUNTER — Ambulatory Visit (HOSPITAL_COMMUNITY)
Admission: RE | Admit: 2021-04-25 | Discharge: 2021-04-25 | Disposition: A | Discharge status: Home / Self Care | Payer: BC Managed Care – PPO | Attending: Gastroenterology | Admitting: Gastroenterology

## 2021-04-25 DIAGNOSIS — D123 Benign neoplasm of transverse colon: Secondary | ICD-10-CM | POA: Diagnosis not present

## 2021-04-25 DIAGNOSIS — G4730 Sleep apnea, unspecified: Secondary | ICD-10-CM | POA: Insufficient documentation

## 2021-04-25 DIAGNOSIS — Z6841 Body Mass Index (BMI) 40.0 and over, adult: Secondary | ICD-10-CM | POA: Diagnosis not present

## 2021-04-25 DIAGNOSIS — K635 Polyp of colon: Secondary | ICD-10-CM | POA: Diagnosis not present

## 2021-04-25 DIAGNOSIS — Z1211 Encounter for screening for malignant neoplasm of colon: Secondary | ICD-10-CM | POA: Insufficient documentation

## 2021-04-25 DIAGNOSIS — I10 Essential (primary) hypertension: Secondary | ICD-10-CM | POA: Insufficient documentation

## 2021-04-25 DIAGNOSIS — Z800 Family history of malignant neoplasm of digestive organs: Secondary | ICD-10-CM | POA: Insufficient documentation

## 2021-04-25 HISTORY — PX: POLYPECTOMY: SHX5525

## 2021-04-25 HISTORY — PX: COLONOSCOPY WITH PROPOFOL: SHX5780

## 2021-04-25 SURGERY — COLONOSCOPY WITH PROPOFOL
Anesthesia: Monitor Anesthesia Care

## 2021-04-25 MED ORDER — LACTATED RINGERS IV SOLN: INTRAVENOUS | Status: DC

## 2021-04-25 MED ORDER — LIDOCAINE HCL (CARDIAC) PF 100 MG/5ML IV SOSY
PREFILLED_SYRINGE | INTRAVENOUS | Status: DC | PRN
  Administered 2021-04-25: 100 mg via INTRAVENOUS

## 2021-04-25 MED ORDER — LACTATED RINGERS IV SOLN
INTRAVENOUS | Status: AC | PRN
  Administered 2021-04-25: 1000 mL via INTRAVENOUS

## 2021-04-25 MED ORDER — PROPOFOL 10 MG/ML IV BOLUS
INTRAVENOUS | Status: DC | PRN
  Administered 2021-04-25: 20 mg via INTRAVENOUS
  Administered 2021-04-25: 50 mg via INTRAVENOUS

## 2021-04-25 MED ORDER — PROPOFOL 500 MG/50ML IV EMUL
INTRAVENOUS | Status: DC | PRN
  Administered 2021-04-25: 120 ug/kg/min via INTRAVENOUS

## 2021-04-25 MED ORDER — SODIUM CHLORIDE 0.9 % IV SOLN: INTRAVENOUS | Status: DC

## 2021-04-25 SURGICAL SUPPLY — 22 items

## 2021-04-25 NOTE — Transfer of Care (Signed)
Immediate Anesthesia Transfer of Care Note ? ?Patient: Evelyn Wade ? ?Procedure(s) Performed: COLONOSCOPY WITH PROPOFOL ?POLYPECTOMY ? ?Patient Location: Endoscopy Unit ? ?Anesthesia Type:MAC ? ?Level of Consciousness: awake, alert , oriented and patient cooperative ? ?Airway & Oxygen Therapy: Patient Spontanous Breathing and Patient connected to face mask oxygen ? ?Post-op Assessment: Report given to RN and Post -op Vital signs reviewed and stable ? ?Post vital signs: Reviewed and stable ? ?Last Vitals:  ?Vitals Value Taken Time  ?BP 109/60 04/25/21 1009  ?Temp    ?Pulse 79 04/25/21 1010  ?Resp 10 04/25/21 1010  ?SpO2 100 % 04/25/21 1010  ?Vitals shown include unvalidated device data. ? ?Last Pain:  ?Vitals:  ? 04/25/21 0827  ?TempSrc: Oral  ?PainSc:   ?   ? ?  ? ?Complications: No notable events documented. ?

## 2021-04-25 NOTE — Op Note (Signed)
[facility] ?Patient Name: Evelyn Wade ?Procedure Date: 04/25/2021 ?MRN: [id number] ?Attending MD: Carol Ada , MD ?Date of Birth: 1970-02-23 ?CSN: [id number] ?Age: 51 ?Admit Type: Outpatient ?Procedure:                Colonoscopy ?Indications:              Screening for colorectal malignant neoplasm ?Providers:                Carol Ada, MD, Mikey College, RN, Elberta Fortis  ?                          Johnella Moloney, Technician ?Referring MD:              ?Medicines:                Propofol per Anesthesia ?Complications:            No immediate complications. ?Estimated Blood Loss:     Estimated blood loss: none. ?Procedure:                Pre-Anesthesia Assessment: ?                          - Prior to the procedure, a History and Physical  ?                          was performed, and patient medications and  ?                          allergies were reviewed. The patient's tolerance of  ?                          previous anesthesia was also reviewed. The risks  ?                          and benefits of the procedure and the sedation  ?                          options and risks were discussed with the patient.  ?                          All questions were answered, and informed consent  ?                          was obtained. Prior Anticoagulants: The patient has  ?                          taken no previous anticoagulant or antiplatelet  ?                          agents. ASA Grade Assessment: III - A patient with  ?                          severe systemic disease. After reviewing the risks  ?  and benefits, the patient was deemed in  ?                          satisfactory condition to undergo the procedure. ?                          - Sedation was administered by an anesthesia  ?                          professional. Deep sedation was attained. ?                          After obtaining informed consent, the colonoscope  ?                          was passed under  direct vision. Throughout the  ?                          procedure, the patient's blood pressure, pulse, and  ?                          oxygen saturations were monitored continuously. The  ?                          CF-HQ190L ([id number]) Olympus colonoscope was  ?                          introduced through the anus and advanced to the the  ?                          cecum, identified by appendiceal orifice and  ?                          ileocecal valve. The colonoscopy was performed  ?                          without difficulty. The patient tolerated the  ?                          procedure well. The quality of the bowel  ?                          preparation was evaluated using the BBPS [facility]  ?                          Bowel Preparation Scale) with scores of: Right  ?                          Colon = 3 (entire mucosa seen well with no residual  ?                          staining, small fragments of stool or opaque  ?  liquid), Transverse Colon = 3 (entire mucosa seen  ?                          well with no residual staining, small fragments of  ?                          stool or opaque liquid) and Left Colon = 3 (entire  ?                          mucosa seen well with no residual staining, small  ?                          fragments of stool or opaque liquid). The total  ?                          BBPS score equals 9. The quality of the bowel  ?                          preparation was good. The ileocecal valve,  ?                          appendiceal orifice, and rectum were photographed. ?Scope In: 9:48:11 AM ?Scope Out: 84:69:62 AM ?Scope Withdrawal Time: 0 hours 13 minutes 20 seconds  ?Total Procedure Duration: 0 hours 17 minutes 53 seconds  ?Findings: ?     Three sessile polyps were found in the transverse colon and ascending  ?     colon. The polyps were 2 to 5 mm in size. These polyps were removed with  ?     a cold snare. Resection and retrieval were  complete. ?Impression:               - Three 2 to 5 mm polyps in the transverse colon  ?                          and in the ascending colon, removed with a cold  ?                          snare. Resected and retrieved. ?Moderate Sedation: ?     Not Applicable - Patient had care per Anesthesia. ?Recommendation:           - Patient has a contact number available for  ?                          emergencies. The signs and symptoms of potential  ?                          delayed complications were discussed with the  ?                          patient. Return to normal activities tomorrow.  ?                          Written discharge instructions were provided to the  ?  patient. ?                          - Resume previous diet. ?                          - Continue present medications. ?                          - Await pathology results. ?                          - Repeat colonoscopy in 5 years for surveillance. ?Procedure Code(s):        --- Professional --- ?                          [phone], Colonoscopy, flexible; with removal of  ?                          tumor(s), polyp(s), or other lesion(s) by snare  ?                          technique ?Diagnosis Code(s):        --- Professional --- ?                          Z12.11, Encounter for screening for malignant  ?                          neoplasm of colon ?                          K63.5, Polyp of colon ?CPT copyright 2019 American Medical Association. All rights reserved. ?The codes documented in this report are preliminary and upon coder review may  ?be revised to meet current compliance requirements. ?Carol Ada, MD ?Carol Ada, MD ?04/25/2021 10:15:37 AM ?This report has been signed electronically. ?Number of Addenda: 0 ?

## 2021-04-25 NOTE — H&P (Signed)
Evelyn Wade ?HPI: At this time the patient denies any problems with nausea, vomiting, fevers, chills, abdominal pain, diarrhea, constipation, hematochezia, melena, GERD, or dysphagia. The patient's maternal aunt had colon cancer, but she does not recall the age.  No complaints of chest pain, SOB, MI, or sleep apnea.   ? ?Past Medical History:  ?Diagnosis Date  ? Diabetes mellitus without complication ([place])   ? Hypertension   ? Pneumonia   ? hx  ? Sleep apnea   ? cpap 4-5 yrs  ? ? ?Past Surgical History:  ?Procedure Laterality Date  ? BARIATRIC SURGERY    ? CESAREAN SECTION N/A 08/17/2014  ? Procedure: CESAREAN SECTION;  Surgeon: Everett Graff, MD;  Location: [facility];  Service: Obstetrics;  Laterality: N/A;  ? KNEE ARTHROSCOPY Left   ? SUBMANDIBULAR GLAND EXCISION Left 09/07/2012  ? Procedure: EXCISION SUBMANDIBULAR GLAND;  Surgeon: Ascencion Dike, MD;  Location: [facility];  Service: ENT;  Laterality: Left;  Excision Submandibular Gland  ? TONSILLECTOMY    ? ? ?Family History  ?Problem Relation Age of Onset  ? Ovarian cancer Mother   ? Heart attack Father   ? Diabetes Father   ? Hypertension Brother   ? ? ?Social History:  reports that she has never smoked. She has never used smokeless tobacco. She reports that she does not drink alcohol and does not use drugs. ? ?Allergies: No Known Allergies ? ?Medications: Scheduled: ?Continuous: ? sodium chloride    ? lactated ringers    ? lactated ringers 10 mL/hr at 04/25/21 5726  ? ? ?No results found for this or any previous visit (from the past 24 hour(s)).  ? ?No results found. ? ?ROS:  As stated above in the HPI otherwise negative. ? ?Blood pressure (!) 126/98, pulse 68, temperature 98.4 ?F (36.9 ?C), temperature source Oral, resp. rate 15, height 5' (1.524 m), weight (!) 158.9 kg, last menstrual period 04/20/2021, SpO2 100 %.   ? ?PE: ?Gen: NAD, Alert and Oriented ?HEENT:  /AT, EOMI ?Neck: Supple, no LAD ?Lungs: CTA Bilaterally ?CV: RRR without M/G/R ?ABD: Soft, NTND,  +BS ?Ext: No C/C/E ? ?Assessment/Plan: ?1) Screening colonoscopy. ? ?Evelyn Wade D ?04/25/2021, 9:27 AM  ? ? ?  ? ?

## 2021-04-25 NOTE — Anesthesia Postprocedure Evaluation (Signed)
Anesthesia Post Note ? ?Patient: Evelyn Wade ? ?Procedure(s) Performed: COLONOSCOPY WITH PROPOFOL ?POLYPECTOMY ? ?  ? ?Patient location during evaluation: Endoscopy ?Anesthesia Type: MAC ?Level of consciousness: awake and alert ?Pain management: pain level controlled ?Vital Signs Assessment: post-procedure vital signs reviewed and stable ?Respiratory status: spontaneous breathing, nonlabored ventilation, respiratory function stable and patient connected to nasal cannula oxygen ?Cardiovascular status: blood pressure returned to baseline and stable ?Postop Assessment: no apparent nausea or vomiting ?Anesthetic complications: no ? ? ?No notable events documented. ? ?Last Vitals:  ?Vitals:  ? 04/25/21 1020 04/25/21 1030  ?BP: (!) 126/113 126/72  ?Pulse: 75 65  ?Resp: 18 14  ?Temp:    ?SpO2: 100% 100%  ?  ?Last Pain:  ?Vitals:  ? 04/25/21 1030  ?TempSrc:   ?PainSc: 0-No pain  ? ? ?  ?  ?  ?  ?  ?  ? ?[place] ? ? ? ? ?

## 2021-04-28 ENCOUNTER — Encounter (HOSPITAL_COMMUNITY): Payer: Self-pay | Admitting: Gastroenterology

## 2021-04-28 LAB — SURGICAL PATHOLOGY

## 2021-09-09 ENCOUNTER — Encounter: Payer: Self-pay | Admitting: Internal Medicine

## 2021-09-09 ENCOUNTER — Ambulatory Visit (INDEPENDENT_AMBULATORY_CARE_PROVIDER_SITE_OTHER): Payer: BC Managed Care – PPO | Admitting: Internal Medicine

## 2021-09-09 VITALS — BP 118/68 | HR 78 | Temp 98.7°F | Ht 60.0 in | Wt 345.2 lb

## 2021-09-09 DIAGNOSIS — R5383 Other fatigue: Secondary | ICD-10-CM

## 2021-09-09 DIAGNOSIS — I10 Essential (primary) hypertension: Secondary | ICD-10-CM | POA: Diagnosis not present

## 2021-09-09 DIAGNOSIS — E1169 Type 2 diabetes mellitus with other specified complication: Secondary | ICD-10-CM | POA: Diagnosis not present

## 2021-09-09 DIAGNOSIS — G4733 Obstructive sleep apnea (adult) (pediatric): Secondary | ICD-10-CM | POA: Diagnosis not present

## 2021-09-09 DIAGNOSIS — Z566 Other physical and mental strain related to work: Secondary | ICD-10-CM

## 2021-09-09 DIAGNOSIS — Z9989 Dependence on other enabling machines and devices: Secondary | ICD-10-CM

## 2021-09-09 DIAGNOSIS — Z6841 Body Mass Index (BMI) 40.0 and over, adult: Secondary | ICD-10-CM

## 2021-09-09 NOTE — Progress Notes (Signed)
Barnet Glasgow Martin,acting as a Education administrator for Maximino Greenland, MD.,have documented all relevant documentation on the behalf of Maximino Greenland, MD,as directed by  Maximino Greenland, MD while in the presence of Maximino Greenland, MD.    Subjective:     Patient ID: Evelyn Wade , female    DOB: 1971-01-08 , 51 y.o.   MRN: [id number]   Chief Complaint  Patient presents with   Diabetes    HPI  Patient presents today for a DM check. Patient is compliant with all medications, but admits to feeling really tired lately. Patient states she is not sure if its due to work stress. Patient states she has been feeling more fatigued for the past 2-3 months.    BP Readings from Last 3 Encounters: 09/09/21 : 118/68 04/25/21 : 126/72 02/11/21 : 114/78    Diabetes She presents for her follow-up diabetic visit. She has type 2 diabetes mellitus. Her disease course has been stable. There are no hypoglycemic associated symptoms. Associated symptoms include fatigue. Pertinent negatives for diabetes include no blurred vision and no chest pain. There are no hypoglycemic complications. Symptoms are stable. Risk factors for coronary artery disease include diabetes mellitus, hypertension, obesity and sedentary lifestyle. Her weight is decreasing steadily. She is following a diabetic diet. Her breakfast blood glucose is taken between 7-8 am. Her breakfast blood glucose range is generally 90-110 mg/dl.  Hypertension This is a chronic problem. The current episode started more than 1 year ago. The problem is controlled. Pertinent negatives include no blurred vision or chest pain.     Past Medical History:  Diagnosis Date   Diabetes mellitus without complication (HCC)    Hypertension    Pneumonia    hx   Sleep apnea    cpap 4-5 yrs     Family History  Problem Relation Age of Onset   Ovarian cancer Mother    Heart attack Father    Diabetes Father    Hypertension Brother      Current Outpatient Medications:     acetaminophen (TYLENOL) 650 MG CR tablet, Take 1,300 mg by mouth every 8 (eight) hours as needed for pain., Disp: , Rfl:    albuterol (VENTOLIN HFA) 108 (90 Base) MCG/ACT inhaler, Inhale 2 puffs every 4 to 8 hours as needed, Disp: 3 each, Rfl: 3   Fe Fum-FePoly-Vit C-Vit B3 (INTEGRA) 62.5-62.5-40-3 MG CAPS, Take 1 capsule by mouth daily., Disp: 90 capsule, Rfl: 1   glucose blood test strip, Use as instructed, Disp: 50 each, Rfl: 12   olmesartan-hydrochlorothiazide (BENICAR HCT) 20-12.5 MG tablet, Take 1 tablet by mouth daily., Disp: 90 tablet, Rfl: 2   Olopatadine HCl (PATADAY OP), Place 1 drop into both eyes daily as needed (allergies)., Disp: , Rfl:    celecoxib (CELEBREX) 200 MG capsule, Take 1 capsule (200 mg total) by mouth daily. (Patient not taking: Reported on 02/11/2021), Disp: 30 capsule, Rfl: 3   Sod Picosulfate-Mag Ox-Cit Acd (CLENPIQ) 10-3.5-12 MG-GM -GM/160ML SOLN, Take 160 mLs by mouth twice. Use as directed per office and not instructions on packaging. Do not refrigerate (Patient not taking: Reported on 09/09/2021), Disp: 320 mL, Rfl: 0   tirzepatide (MOUNJARO) 5 MG/0.5ML Pen, Inject 5 mg into the skin once a week. (Patient not taking: Reported on 02/11/2021), Disp: 2 mL, Rfl: 1   traMADol (ULTRAM) 50 MG tablet, Take 1 tablet (50 mg total) by mouth every 6 (six) hours as needed. (Patient not taking: Reported on 04/24/2021), Disp: 20  tablet, Rfl: 0   No Known Allergies   Review of Systems  Constitutional:  Positive for fatigue.  HENT: Negative.    Eyes: Negative.  Negative for blurred vision.  Respiratory: Negative.    Cardiovascular: Negative.  Negative for chest pain.  Gastrointestinal: Negative.      Today's Vitals   09/09/21 1540  BP: 118/68  Pulse: 78  Temp: 98.7 F (37.1 C)  TempSrc: Oral  Weight: (!) 345 lb 3.2 oz (156.6 kg)  Height: 5' (1.524 m)  PainSc: 8   PainLoc: Hip   Body mass index is 67.42 kg/m.  Wt Readings from Last 3 Encounters:  09/09/21 (!) 345 lb 3.2  oz (156.6 kg)  04/25/21 (!) 350 lb 5 oz (158.9 kg)  02/11/21 (!) 350 lb 6.4 oz (158.9 kg)    Objective:  Physical Exam Vitals and nursing note reviewed.  Constitutional:      Appearance: Normal appearance.  HENT:     Head: Normocephalic and atraumatic.  Cardiovascular:     Rate and Rhythm: Normal rate and regular rhythm.     Heart sounds: Normal heart sounds.  Pulmonary:     Effort: Pulmonary effort is normal.     Breath sounds: Normal breath sounds.  Skin:    General: Skin is warm.  Neurological:     General: No focal deficit present.     Mental Status: She is alert.  Psychiatric:        Mood and Affect: Mood normal.        Behavior: Behavior normal.         Assessment And Plan:     1. Type 2 diabetes mellitus with other specified complication, without long-term current use of insulin (HCC) Comments: Chronic, controlled with diet. Previously on GLP-1 agonist; however, she has stopped it. Encouraged to follow carb controlled diet.  - Hemoglobin A1c - CMP14+EGFR  2. Essential hypertension, benign Comments: Chronic, well controlled. She is encouraged to follow low sodium diet.  - CMP14+EGFR  3. Fatigue, unspecified type Comments: I will check labs as below. She does have h/o anemia.  - Vitamin B12 - Iron, TIBC and Ferritin Panel - CBC no Diff  4. OSA on CPAP Comments: Chronic, encouraged to wear CPAP at least 4 hrs/night.  5. Work stress Comments: She agrees to referral for counseling.   6. Class 3 severe obesity due to excess calories with serious comorbidity and body mass index (BMI) of 60.0 to 69.9 in adult [facility]) Comments: She is encouraged to aim for at least 150 minutes of exercise per week.      Patient was given opportunity to ask questions. Patient verbalized understanding of the plan and was able to repeat key elements of the plan. All questions were answered to their satisfaction.   I, Maximino Greenland, MD, have reviewed all documentation for this  visit. The documentation on 09/09/21 for the exam, diagnosis, procedures, and orders are all accurate and complete.   IF YOU HAVE BEEN REFERRED TO A SPECIALIST, IT MAY TAKE 1-2 WEEKS TO SCHEDULE/PROCESS THE REFERRAL. IF YOU HAVE NOT HEARD FROM US/SPECIALIST IN TWO WEEKS, PLEASE GIVE [place] A CALL AT [phone].   THE PATIENT IS ENCOURAGED TO PRACTICE SOCIAL DISTANCING DUE TO THE COVID-19 PANDEMIC.

## 2021-09-09 NOTE — Patient Instructions (Signed)

## 2021-09-10 LAB — CBC
Hematocrit: 34.5 % (ref 34.0–46.6)
Hemoglobin: 11 g/dL — ABNORMAL LOW (ref 11.1–15.9)
MCH: 24.2 pg — ABNORMAL LOW (ref 26.6–33.0)
MCHC: 31.9 g/dL (ref 31.5–35.7)
MCV: 76 fL — ABNORMAL LOW (ref 79–97)
Platelets: 386 10*3/uL (ref 150–450)
RBC: 4.55 x10E6/uL (ref 3.77–5.28)
RDW: 15.5 % — ABNORMAL HIGH (ref 11.7–15.4)
WBC: 9 10*3/uL (ref 3.4–10.8)

## 2021-09-10 LAB — IRON,TIBC AND FERRITIN PANEL
Ferritin: 55 ng/mL (ref 15–150)
Iron Saturation: 14 % — ABNORMAL LOW (ref 15–55)
Iron: 39 ug/dL (ref 27–159)
Total Iron Binding Capacity: 288 ug/dL (ref 250–450)
UIBC: 249 ug/dL (ref 131–425)

## 2021-09-10 LAB — CMP14+EGFR
ALT: 17 IU/L (ref 0–32)
AST: 14 IU/L (ref 0–40)
Albumin/Globulin Ratio: 1.3 (ref 1.2–2.2)
Albumin: 3.9 g/dL (ref 3.9–4.9)
Alkaline Phosphatase: 89 IU/L (ref 44–121)
BUN/Creatinine Ratio: 20 (ref 9–23)
BUN: 16 mg/dL (ref 6–24)
Bilirubin Total: <0.2 mg/dL (ref 0.0–1.2)
CO2: 25 mmol/L (ref 20–29)
Calcium: 10.3 mg/dL — ABNORMAL HIGH (ref 8.7–10.2)
Chloride: 102 mmol/L (ref 96–106)
Creatinine, Ser: 0.82 mg/dL (ref 0.57–1.00)
Globulin, Total: 2.9 g/dL (ref 1.5–4.5)
Glucose: 136 mg/dL — ABNORMAL HIGH (ref 70–99)
Potassium: 4.5 mmol/L (ref 3.5–5.2)
Sodium: 139 mmol/L (ref 134–144)
Total Protein: 6.8 g/dL (ref 6.0–8.5)
eGFR: 88 mL/min/{1.73_m2} (ref >59)

## 2021-09-10 LAB — HEMOGLOBIN A1C
Est. average glucose Bld gHb Est-mCnc: 117 mg/dL
Hgb A1c MFr Bld: 5.7 % — ABNORMAL HIGH (ref 4.8–5.6)

## 2021-09-10 LAB — VITAMIN B12: Vitamin B-12: 902 pg/mL (ref 232–1245)

## 2021-09-13 ENCOUNTER — Encounter: Payer: Self-pay | Admitting: Internal Medicine

## 2021-11-12 ENCOUNTER — Other Ambulatory Visit: Payer: Self-pay | Admitting: Internal Medicine

## 2021-12-10 ENCOUNTER — Encounter: Payer: Self-pay | Admitting: Internal Medicine

## 2021-12-10 ENCOUNTER — Ambulatory Visit: Payer: BC Managed Care – PPO | Admitting: Internal Medicine

## 2021-12-10 VITALS — BP 120/82 | HR 88 | Temp 98.0°F | Ht 60.0 in | Wt 345.0 lb

## 2021-12-10 DIAGNOSIS — R059 Cough, unspecified: Secondary | ICD-10-CM

## 2021-12-10 DIAGNOSIS — R0981 Nasal congestion: Secondary | ICD-10-CM | POA: Diagnosis not present

## 2021-12-10 DIAGNOSIS — R051 Acute cough: Secondary | ICD-10-CM | POA: Diagnosis not present

## 2021-12-10 LAB — POC COVID19 BINAXNOW: SARS Coronavirus 2 Ag: NEGATIVE

## 2021-12-10 MED ORDER — HYDROCODONE BIT-HOMATROP MBR 5-1.5 MG/5ML PO SOLN: 5.0000 mL | Freq: Four times a day (QID) | ORAL | 0 refills | Status: DC | PRN

## 2021-12-10 MED ORDER — BENZONATATE 100 MG PO CAPS: 100.0000 mg | ORAL_CAPSULE | Freq: Three times a day (TID) | ORAL | 1 refills | Status: AC | PRN

## 2021-12-10 MED ORDER — TRIAMCINOLONE ACETONIDE 40 MG/ML IJ SUSP
60.0000 mg | Freq: Once | INTRAMUSCULAR | Status: AC
  Administered 2021-12-10: 60 mg via INTRAMUSCULAR

## 2021-12-10 NOTE — Progress Notes (Unsigned)
[name],acting as a Education administrator for Maximino Greenland, MD.,have documented all relevant documentation on the behalf of Maximino Greenland, MD,as directed by  Maximino Greenland, MD while in the presence of Maximino Greenland, MD.    Subjective:     Patient ID: Evelyn Wade , female    DOB: 03/01/1970 , 51 y.o.   MRN: [id number]   Chief Complaint  Patient presents with   URI    HPI  Patient presents today for cold symptoms. She c/o sinus congestion, cough and sinus drainage. Sx started on Sunday. She started Mucinex yesterday without relief of her sx. She has slight headache, denies fever/chills/body aches. She has tried some OTC meds w/o relief of her sx.   URI  This is a new problem. The current episode started in the past 7 days. There has been no fever. Associated symptoms include congestion, coughing, ear pain (left ear), headaches, sinus pain and sneezing. Pertinent negatives include no sore throat. Associated symptoms comments: fatigue. She has tried decongestant for the symptoms. The treatment provided no relief.     Past Medical History:  Diagnosis Date   Diabetes mellitus without complication (HCC)    Hypertension    Pneumonia    hx   Sleep apnea    cpap 4-5 yrs     Family History  Problem Relation Age of Onset   Ovarian cancer Mother    Heart attack Father    Diabetes Father    Hypertension Brother      Current Outpatient Medications:    acetaminophen (TYLENOL) 650 MG CR tablet, Take 1,300 mg by mouth every 8 (eight) hours as needed for pain., Disp: , Rfl:    albuterol (VENTOLIN HFA) 108 (90 Base) MCG/ACT inhaler, Inhale 2 puffs every 4 to 8 hours as needed, Disp: 3 each, Rfl: 3   benzonatate (TESSALON PERLES) 100 MG capsule, Take 1 capsule (100 mg total) by mouth 3 (three) times daily as needed for cough., Disp: 30 capsule, Rfl: 1   Fe Fum-FePoly-Vit C-Vit B3 (INTEGRA) 62.5-62.5-40-3 MG CAPS, TAKE 1 CAPSULE BY MOUTH EVERY DAY, Disp: 90 capsule, Rfl: 1    glucose blood test strip, Use as instructed, Disp: 50 each, Rfl: 12   HYDROcodone bit-homatropine (HYDROMET) 5-1.5 MG/5ML syrup, Take 5 mLs by mouth every 6 (six) hours as needed., Disp: 120 mL, Rfl: 0   olmesartan-hydrochlorothiazide (BENICAR HCT) 20-12.5 MG tablet, Take 1 tablet by mouth daily., Disp: 90 tablet, Rfl: 2   Olopatadine HCl (PATADAY OP), Place 1 drop into both eyes daily as needed (allergies)., Disp: , Rfl:    tirzepatide (MOUNJARO) 5 MG/0.5ML Pen, Inject 5 mg into the skin once a week. (Patient not taking: Reported on 12/10/2021), Disp: 2 mL, Rfl: 1   No Known Allergies   Review of Systems  HENT:  Positive for congestion, ear pain (left ear), sinus pain and sneezing. Negative for sore throat.   Respiratory:  Positive for cough.   Neurological:  Positive for headaches.     Today's Vitals   12/10/21 1035  BP: 120/82  Pulse: 88  Temp: 98 F (36.7 C)  Weight: (!) 345 lb (156.5 kg)  Height: 5' (1.524 m)  PainSc: 0-No pain   Body mass index is 67.38 kg/m.   Objective:  Physical Exam Vitals and nursing note reviewed.  Constitutional:      Appearance: Normal appearance. She is obese. She is ill-appearing.  HENT:     Head: Normocephalic and atraumatic.  Comments: Slight frontal tenderness    Right Ear: Tympanic membrane, ear canal and external ear normal. There is no impacted cerumen.     Left Ear: Tympanic membrane, ear canal and external ear normal. There is no impacted cerumen.     Nose:     Comments: Masked     Mouth/Throat:     Comments: Masked  Eyes:     General: No scleral icterus.    Extraocular Movements: Extraocular movements intact.  Cardiovascular:     Rate and Rhythm: Normal rate and regular rhythm.     Heart sounds: Normal heart sounds.  Pulmonary:     Effort: Pulmonary effort is normal.     Breath sounds: Normal breath sounds.  Musculoskeletal:     Cervical back: Normal range of motion.  Skin:    General: Skin is warm.  Neurological:      General: No focal deficit present.     Mental Status: She is alert.  Psychiatric:        Mood and Affect: Mood normal.        Behavior: Behavior normal.       Assessment And Plan:     1. Sinus congestion Comments: She was given Kenalog, '60mg'$  Im x 1. Advised to avoid dairy and to stay well hydrated. We discussed increased potential for false negative rapid COVID-19 tests early in illness with current circulating strains and PCR testing is recommended. Isolate while awaiting results. Also discussed sx are consistent with viral syndrome - supportive care and contagious nature of illness reviewed. Will prescribe Hydromet syrup and Tessalon perles as needed for cough. She is alo encouraged to stay well hydrated, rest and take Tylenol prn fever/sore throat.   - Novel Coronavirus, NAA ([facility]) - POC COVID-19 - triamcinolone acetonide (KENALOG-40) injection 60 mg  2. Acute cough Comments: I will send rx Hydromet syrup to use nightly prn and benzonatate capsules to use pnr. She will let me know if her sx persist. - Novel Coronavirus, NAA ([facility]) - POC COVID-19   Patient was given opportunity to ask questions. Patient verbalized understanding of the plan and was able to repeat key elements of the plan. All questions were answered to their satisfaction.   I, Maximino Greenland, MD, have reviewed all documentation for this visit. The documentation on 12/10/21 for the exam, diagnosis, procedures, and orders are all accurate and complete.   IF YOU HAVE BEEN REFERRED TO A SPECIALIST, IT MAY TAKE 1-2 WEEKS TO SCHEDULE/PROCESS THE REFERRAL. IF YOU HAVE NOT HEARD FROM US/SPECIALIST IN TWO WEEKS, PLEASE GIVE [place] A CALL AT [phone].   THE PATIENT IS ENCOURAGED TO PRACTICE SOCIAL DISTANCING DUE TO THE COVID-19 PANDEMIC.

## 2021-12-11 LAB — NOVEL CORONAVIRUS, NAA: SARS-CoV-2, NAA: NOT DETECTED

## 2021-12-12 ENCOUNTER — Telehealth: Payer: Self-pay

## 2021-12-12 NOTE — Telephone Encounter (Signed)
I called pt to see how she is doing she stated she is feeling better she just has a cough and drainage. Tyler Deis

## 2021-12-29 ENCOUNTER — Other Ambulatory Visit: Payer: Self-pay | Admitting: Internal Medicine

## 2021-12-29 ENCOUNTER — Encounter: Payer: Self-pay | Admitting: Internal Medicine

## 2021-12-29 MED ORDER — HYDROCODONE BIT-HOMATROP MBR 5-1.5 MG/5ML PO SOLN: 5.0000 mL | Freq: Four times a day (QID) | ORAL | 0 refills | Status: DC | PRN

## 2022-01-19 ENCOUNTER — Encounter: Payer: Self-pay | Admitting: Internal Medicine

## 2022-01-19 ENCOUNTER — Ambulatory Visit (INDEPENDENT_AMBULATORY_CARE_PROVIDER_SITE_OTHER): Payer: BC Managed Care – PPO | Admitting: Internal Medicine

## 2022-01-19 VITALS — BP 132/80 | HR 82 | Temp 98.3°F | Ht 60.0 in | Wt 339.2 lb

## 2022-01-19 DIAGNOSIS — E1169 Type 2 diabetes mellitus with other specified complication: Secondary | ICD-10-CM | POA: Diagnosis not present

## 2022-01-19 DIAGNOSIS — I10 Essential (primary) hypertension: Secondary | ICD-10-CM | POA: Diagnosis not present

## 2022-01-19 DIAGNOSIS — Z6841 Body Mass Index (BMI) 40.0 and over, adult: Secondary | ICD-10-CM

## 2022-01-19 DIAGNOSIS — Z0000 Encounter for general adult medical examination without abnormal findings: Secondary | ICD-10-CM | POA: Diagnosis not present

## 2022-01-19 DIAGNOSIS — N921 Excessive and frequent menstruation with irregular cycle: Secondary | ICD-10-CM

## 2022-01-19 LAB — POCT URINALYSIS DIPSTICK
Bilirubin, UA: NEGATIVE
Glucose, UA: NEGATIVE
Ketones, UA: NEGATIVE
Leukocytes, UA: NEGATIVE
Nitrite, UA: NEGATIVE
Protein, UA: NEGATIVE
Spec Grav, UA: 1.01 (ref 1.010–1.025)
Urobilinogen, UA: 1 E.U./dL
pH, UA: 7 (ref 5.0–8.0)

## 2022-01-19 NOTE — Progress Notes (Signed)
Evelyn Wade,acting as a Education administrator for Evelyn Greenland, MD.,have documented all relevant documentation on the behalf of Evelyn Greenland, MD,as directed by  Evelyn Greenland, MD while in the presence of Evelyn Greenland, MD.   Subjective:     Patient ID: Evelyn Wade , female    DOB: 1970/02/12 , 51 y.o.   MRN: [id number]   Chief Complaint  Patient presents with   Annual Exam   Diabetes   Hypertension    HPI  She is here today for a full physical exam. She has been followed by [facility]/Dr. Delora Fuel for her GYN care.  She reports compliance with meds. She denies having any headaches, chest pain and shortness of breath.  She has no specific concerns or complaints at this time.   Diabetes She presents for her follow-up diabetic visit. She has type 2 diabetes mellitus. Her disease course has been stable. There are no hypoglycemic associated symptoms. Pertinent negatives for diabetes include no blurred vision. There are no hypoglycemic complications. Symptoms are stable. Risk factors for coronary artery disease include diabetes mellitus, hypertension, obesity and sedentary lifestyle. Her weight is decreasing steadily. She is following a diabetic diet. Her breakfast blood glucose is taken between 7-8 am. Her breakfast blood glucose range is generally 90-110 mg/dl.  Hypertension This is a chronic problem. The current episode started more than 1 year ago. The problem is controlled. Pertinent negatives include no blurred vision. Risk factors for coronary artery disease include dyslipidemia, diabetes mellitus and obesity. Past treatments include angiotensin blockers. The current treatment provides moderate improvement.     Past Medical History:  Diagnosis Date   Diabetes mellitus without complication (HCC)    Hypertension    Pneumonia    hx   Sleep apnea    cpap 4-5 yrs     Family History  Problem Relation Age of Onset   Ovarian cancer Mother    Heart attack Father    Diabetes Father     Hypertension Brother      Current Outpatient Medications:    acetaminophen (TYLENOL) 650 MG CR tablet, Take 1,300 mg by mouth every 8 (eight) hours as needed for pain., Disp: , Rfl:    albuterol (VENTOLIN HFA) 108 (90 Base) MCG/ACT inhaler, Inhale 2 puffs every 4 to 8 hours as needed, Disp: 3 each, Rfl: 3   benzonatate (TESSALON PERLES) 100 MG capsule, Take 1 capsule (100 mg total) by mouth 3 (three) times daily as needed for cough., Disp: 30 capsule, Rfl: 1   Fe Fum-FePoly-Vit C-Vit B3 (INTEGRA) 62.5-62.5-40-3 MG CAPS, TAKE 1 CAPSULE BY MOUTH EVERY DAY, Disp: 90 capsule, Rfl: 1   glucose blood test strip, Use as instructed, Disp: 50 each, Rfl: 12   olmesartan-hydrochlorothiazide (BENICAR HCT) 20-12.5 MG tablet, Take 1 tablet by mouth daily., Disp: 90 tablet, Rfl: 2   Olopatadine HCl (PATADAY OP), Place 1 drop into both eyes daily as needed (allergies)., Disp: , Rfl:    No Known Allergies    The patient states she uses none for birth control. Last LMP was Patient's last menstrual period was 01/06/2022 (approximate).. Negative for Menorrhagia. Negative for: breast discharge, breast lump(s), breast pain and breast self exam. Associated symptoms include abnormal vaginal bleeding. Pertinent negatives include abnormal bleeding (hematology), anxiety, decreased libido, depression, difficulty falling sleep, dyspareunia, history of infertility, nocturia, sexual dysfunction, sleep disturbances, urinary incontinence, urinary urgency, vaginal discharge and vaginal itching. Diet regular.The patient states her exercise level is    . The  patient's tobacco use is:  Social History   Tobacco Use  Smoking Status Never  Smokeless Tobacco Never  . She has been exposed to passive smoke. The patient's alcohol use is:  Social History   Substance and Sexual Activity  Alcohol Use No   Review of Systems  Constitutional: Negative.   HENT: Negative.    Eyes: Negative.  Negative for blurred vision.  Respiratory:  Negative.    Cardiovascular: Negative.   Gastrointestinal: Negative.   Endocrine: Negative.   Genitourinary:  Positive for menstrual problem.       She states her last cycle was end of Nov, ending on 12/3. She started to bleed again yesterday, 12/10.   Musculoskeletal: Negative.   Skin: Negative.   Allergic/Immunologic: Negative.   Neurological: Negative.   Hematological: Negative.   Psychiatric/Behavioral: Negative.       Today's Vitals   01/19/22 1017  BP: 132/80  Pulse: 82  Temp: 98.3 F (36.8 C)  Weight: (!) 339 lb 3.2 oz (153.9 kg)  Height: 5' (1.524 m)  PainSc: 0-No pain   Body mass index is 66.25 kg/m.  Wt Readings from Last 3 Encounters:  01/19/22 (!) 339 lb 3.2 oz (153.9 kg)  12/10/21 (!) 345 lb (156.5 kg)  09/09/21 (!) 345 lb 3.2 oz (156.6 kg)     Objective:  Physical Exam Vitals and nursing note reviewed.  Constitutional:      Appearance: Normal appearance. She is obese.  HENT:     Head: Normocephalic and atraumatic.     Right Ear: Tympanic membrane, ear canal and external ear normal.     Left Ear: Tympanic membrane, ear canal and external ear normal.     Nose:     Comments: Masked     Mouth/Throat:     Comments: Masked  Eyes:     Extraocular Movements: Extraocular movements intact.     Conjunctiva/sclera: Conjunctivae normal.     Pupils: Pupils are equal, round, and reactive to light.  Cardiovascular:     Rate and Rhythm: Normal rate and regular rhythm.     Pulses: Normal pulses.          Dorsalis pedis pulses are 2+ on the right side and 2+ on the left side.     Heart sounds: Normal heart sounds.  Pulmonary:     Effort: Pulmonary effort is normal.     Breath sounds: Normal breath sounds.  Chest:  Breasts:    Tanner Score is 5.     Right: Normal.     Left: Normal.  Abdominal:     General: Bowel sounds are normal.     Palpations: Abdomen is soft.     Comments: Soft, obese. Difficult to assess organomegaly due to body habitus    Genitourinary:    Comments: deferred Musculoskeletal:        General: Normal range of motion.     Cervical back: Normal range of motion and neck supple.  Feet:     Right foot:     Protective Sensation: 5 sites tested.  5 sites sensed.     Skin integrity: Skin integrity normal.     Toenail Condition: Right toenails are normal.     Left foot:     Protective Sensation: 5 sites tested.  5 sites sensed.     Skin integrity: Skin integrity normal.     Toenail Condition: Left toenails are normal.  Skin:    General: Skin is warm and dry.  Neurological:  General: No focal deficit present.     Mental Status: She is alert and oriented to person, place, and time.  Psychiatric:        Mood and Affect: Mood normal.        Behavior: Behavior normal.       Assessment And Plan:     1. Encounter for general adult medical examination w/o abnormal findings Comments: A full exam was performed. Importance of monthly self breast exams was discussed with the patient.  PATIENT IS ADVISED TO GET 30-45 MINUTES REGULAR EXERCISE NO LESS THAN FOUR TO FIVE DAYS PER WEEK - BOTH WEIGHTBEARING EXERCISES AND AEROBIC ARE RECOMMENDED.  PATIENT IS ADVISED TO FOLLOW A HEALTHY DIET WITH AT LEAST SIX FRUITS/VEGGIES PER DAY, DECREASE INTAKE OF RED MEAT, AND TO INCREASE FISH INTAKE TO TWO DAYS PER WEEK.  MEATS/FISH SHOULD NOT BE FRIED, BAKED OR BROILED IS PREFERABLE.  IT IS ALSO IMPORTANT TO CUT BACK ON YOUR SUGAR INTAKE. PLEASE AVOID ANYTHING WITH ADDED SUGAR, CORN SYRUP OR OTHER SWEETENERS. IF YOU MUST USE A SWEETENER, YOU CAN TRY STEVIA. IT IS ALSO IMPORTANT TO AVOID ARTIFICIALLY SWEETENERS AND DIET BEVERAGES. LASTLY, I SUGGEST WEARING SPF 50 SUNSCREEN ON EXPOSED PARTS AND ESPECIALLY WHEN IN THE DIRECT SUNLIGHT FOR AN EXTENDED PERIOD OF TIME.  PLEASE AVOID FAST FOOD RESTAURANTS AND INCREASE YOUR WATER INTAKE. - CMP14+EGFR - CBC - Hemoglobin A1c - Lipid panel - TSH  2. Type 2 diabetes mellitus with other specified  complication, without long-term current use of insulin (HCC) Comments: Chronic, diabetic foot exam was performed.  She was given samples of Mounjaro 2.41m to use weekly, then advance to 567mweekly. She agrees to f/u in 3 months. I DISCUSSED WITH THE PATIENT AT LENGTH REGARDING THE GOALS OF GLYCEMIC CONTROL AND POSSIBLE LONG-TERM COMPLICATIONS.  I  ALSO STRESSED THE IMPORTANCE OF COMPLIANCE WITH HOME GLUCOSE MONITORING, DIETARY RESTRICTIONS INCLUDING AVOIDANCE OF SUGARY DRINKS/PROCESSED FOODS,  ALONG WITH REGULAR EXERCISE.  I  ALSO STRESSED THE IMPORTANCE OF ANNUAL EYE EXAMS, SELF FOOT CARE AND COMPLIANCE WITH OFFICE VISITS.  - POCT Urinalysis Dipstick (81002) - Microalbumin / Creatinine Urine Ratio  3. Essential hypertension, benign Comments: Chronic, well controlled. EKG performed, NSR w/o acute changes. She will c/w olmesartan/hct 201/2.23m68maily.  She agrees to rto in six months for re-evaluation.  - POCT Urinalysis Dipstick (81002) - Microalbumin / Creatinine Urine Ratio - EKG 12-Lead  4. Menometrorrhagia Comments: She is perimenopausal. I will check TSH. She does have GYN eval later this month. I suggest pelvic u/s if sx recur.  5. Class 3 severe obesity due to excess calories with serious comorbidity and body mass index (BMI) of 60.0 to 69.9 in adult [facility]: BMI 66. She was congratulated on her 6lb weight loss since Nov 2023. She is encouraged to aim for at least 150 minutes of exercise per week, while initially striving for BMI<59 to decrease cardiac risk.  Patient was given opportunity to ask questions. Patient verbalized understanding of the plan and was able to repeat key elements of the plan. All questions were answered to their satisfaction.   I, RobMaximino GreenlandD, have reviewed all documentation for this visit. The documentation on 01/19/22 for the exam, diagnosis, procedures, and orders are all accurate and complete.   THE PATIENT IS ENCOURAGED TO PRACTICE SOCIAL  DISTANCING DUE TO THE COVID-19 PANDEMIC.

## 2022-01-19 NOTE — Patient Instructions (Signed)

## 2022-01-20 LAB — CMP14+EGFR
ALT: 14 IU/L (ref 0–32)
AST: 15 IU/L (ref 0–40)
Albumin/Globulin Ratio: 1.2 (ref 1.2–2.2)
Albumin: 4 g/dL (ref 3.9–4.9)
Alkaline Phosphatase: 78 IU/L (ref 44–121)
BUN/Creatinine Ratio: 22 (ref 9–23)
BUN: 16 mg/dL (ref 6–24)
Bilirubin Total: 0.5 mg/dL (ref 0.0–1.2)
CO2: 24 mmol/L (ref 20–29)
Calcium: 10.7 mg/dL — ABNORMAL HIGH (ref 8.7–10.2)
Chloride: 100 mmol/L (ref 96–106)
Creatinine, Ser: 0.72 mg/dL (ref 0.57–1.00)
Globulin, Total: 3.4 g/dL (ref 1.5–4.5)
Glucose: 106 mg/dL — ABNORMAL HIGH (ref 70–99)
Potassium: 4.3 mmol/L (ref 3.5–5.2)
Sodium: 138 mmol/L (ref 134–144)
Total Protein: 7.4 g/dL (ref 6.0–8.5)
eGFR: 102 mL/min/{1.73_m2} (ref >59)

## 2022-01-20 LAB — CBC
Hematocrit: 36 % (ref 34.0–46.6)
Hemoglobin: 11.4 g/dL (ref 11.1–15.9)
MCH: 24.2 pg — ABNORMAL LOW (ref 26.6–33.0)
MCHC: 31.7 g/dL (ref 31.5–35.7)
MCV: 76 fL — ABNORMAL LOW (ref 79–97)
Platelets: 466 10*3/uL — ABNORMAL HIGH (ref 150–450)
RBC: 4.71 x10E6/uL (ref 3.77–5.28)
RDW: 14.7 % (ref 11.7–15.4)
WBC: 8.3 10*3/uL (ref 3.4–10.8)

## 2022-01-20 LAB — MICROALBUMIN / CREATININE URINE RATIO
Creatinine, Urine: 50.3 mg/dL
Microalb/Creat Ratio: 53 mg/g creat — ABNORMAL HIGH (ref 0–29)
Microalbumin, Urine: 26.8 ug/mL

## 2022-01-20 LAB — LIPID PANEL
Chol/HDL Ratio: 1.8 ratio (ref 0.0–4.4)
Cholesterol, Total: 182 mg/dL (ref 100–199)
HDL: 99 mg/dL (ref >39)
LDL Chol Calc (NIH): 70 mg/dL (ref 0–99)
Triglycerides: 73 mg/dL (ref 0–149)
VLDL Cholesterol Cal: 13 mg/dL (ref 5–40)

## 2022-01-20 LAB — HEMOGLOBIN A1C
Est. average glucose Bld gHb Est-mCnc: 123 mg/dL
Hgb A1c MFr Bld: 5.9 % — ABNORMAL HIGH (ref 4.8–5.6)

## 2022-01-20 LAB — TSH: TSH: 0.816 u[IU]/mL (ref 0.450–4.500)

## 2022-01-21 ENCOUNTER — Other Ambulatory Visit: Payer: Self-pay | Admitting: Internal Medicine

## 2022-01-21 DIAGNOSIS — Z1231 Encounter for screening mammogram for malignant neoplasm of breast: Secondary | ICD-10-CM

## 2022-01-22 ENCOUNTER — Encounter: Payer: Self-pay | Admitting: Internal Medicine

## 2022-01-27 ENCOUNTER — Ambulatory Visit: Payer: BC Managed Care – PPO

## 2022-02-06 ENCOUNTER — Other Ambulatory Visit: Payer: Self-pay | Admitting: Internal Medicine

## 2022-02-06 LAB — HM DIABETES EYE EXAM

## 2022-02-10 ENCOUNTER — Other Ambulatory Visit (HOSPITAL_COMMUNITY): Payer: Self-pay

## 2022-02-17 ENCOUNTER — Other Ambulatory Visit: Payer: Self-pay | Admitting: Nurse Practitioner

## 2022-02-17 DIAGNOSIS — R102 Pelvic and perineal pain: Secondary | ICD-10-CM

## 2022-02-19 ENCOUNTER — Encounter: Payer: Self-pay | Admitting: Internal Medicine

## 2022-02-19 ENCOUNTER — Other Ambulatory Visit: Payer: Self-pay | Admitting: Internal Medicine

## 2022-02-19 MED ORDER — MOUNJARO 5 MG/0.5ML ~~LOC~~ SOAJ: 5.0000 mg | SUBCUTANEOUS | 0 refills | Status: DC

## 2022-02-23 ENCOUNTER — Other Ambulatory Visit: Payer: Self-pay | Admitting: Internal Medicine

## 2022-03-06 ENCOUNTER — Ambulatory Visit
Admission: RE | Admit: 2022-03-06 | Discharge: 2022-03-06 | Disposition: A | Discharge status: Home / Self Care | Payer: BC Managed Care – PPO | Source: Ambulatory Visit | Attending: Nurse Practitioner | Admitting: Nurse Practitioner

## 2022-03-06 DIAGNOSIS — R102 Pelvic and perineal pain: Secondary | ICD-10-CM

## 2022-03-11 ENCOUNTER — Other Ambulatory Visit: Payer: Self-pay | Admitting: Nurse Practitioner

## 2022-03-11 ENCOUNTER — Other Ambulatory Visit: Payer: Self-pay | Admitting: Internal Medicine

## 2022-03-11 DIAGNOSIS — N83292 Other ovarian cyst, left side: Secondary | ICD-10-CM

## 2022-03-11 MED ORDER — ZEPBOUND 5 MG/0.5ML ~~LOC~~ SOAJ: 5.0000 mg | SUBCUTANEOUS | 0 refills | Status: DC

## 2022-03-17 ENCOUNTER — Other Ambulatory Visit: Payer: Self-pay

## 2022-03-17 MED ORDER — SAXENDA 18 MG/3ML ~~LOC~~ SOPN: 3.0000 mg | PEN_INJECTOR | Freq: Every day | SUBCUTANEOUS | 1 refills | Status: DC

## 2022-03-18 ENCOUNTER — Other Ambulatory Visit: Payer: Self-pay | Admitting: Internal Medicine

## 2022-03-19 ENCOUNTER — Ambulatory Visit
Admission: RE | Admit: 2022-03-19 | Discharge: 2022-03-19 | Disposition: A | Discharge status: Home / Self Care | Payer: BC Managed Care – PPO | Source: Ambulatory Visit | Attending: Internal Medicine | Admitting: Internal Medicine

## 2022-03-19 DIAGNOSIS — Z1231 Encounter for screening mammogram for malignant neoplasm of breast: Secondary | ICD-10-CM

## 2022-04-21 ENCOUNTER — Encounter: Payer: Self-pay | Admitting: Internal Medicine

## 2022-04-21 ENCOUNTER — Ambulatory Visit: Payer: BC Managed Care – PPO | Admitting: Internal Medicine

## 2022-04-21 VITALS — BP 118/82 | HR 71 | Temp 97.9°F | Ht 60.0 in | Wt 342.0 lb

## 2022-04-21 DIAGNOSIS — I10 Essential (primary) hypertension: Secondary | ICD-10-CM

## 2022-04-21 DIAGNOSIS — E1169 Type 2 diabetes mellitus with other specified complication: Secondary | ICD-10-CM | POA: Diagnosis not present

## 2022-04-21 DIAGNOSIS — Z6841 Body Mass Index (BMI) 40.0 and over, adult: Secondary | ICD-10-CM

## 2022-04-21 DIAGNOSIS — Z2821 Immunization not carried out because of patient refusal: Secondary | ICD-10-CM

## 2022-04-21 NOTE — Progress Notes (Signed)
I,Victoria T Hamilton,acting as a scribe for Maximino Greenland, MD.,have documented all relevant documentation on the behalf of Maximino Greenland, MD,as directed by  Maximino Greenland, MD while in the presence of Maximino Greenland, MD.    Subjective:     Patient ID: Evelyn Wade , female    DOB: 07/10/1970 , 52 y.o.   MRN: [id number]   Chief Complaint  Patient presents with  . Diabetes  . Hypertension    HPI  Patient presents today for a DM & BPC check. Patient reports compliance with all medications. She reports no specific questions or concerns.    Diabetes She presents for her follow-up diabetic visit. She has type 2 diabetes mellitus. Her disease course has been stable. There are no hypoglycemic associated symptoms. Pertinent negatives for diabetes include no blurred vision and no chest pain. There are no hypoglycemic complications. Symptoms are stable. Risk factors for coronary artery disease include diabetes mellitus, hypertension, obesity and sedentary lifestyle. Her weight is decreasing steadily. She is following a diabetic diet. Her breakfast blood glucose is taken between 7-8 am. Her breakfast blood glucose range is generally 90-110 mg/dl.  Hypertension This is a chronic problem. The current episode started more than 1 year ago. The problem is controlled. Pertinent negatives include no blurred vision or chest pain.     Past Medical History:  Diagnosis Date  . Diabetes mellitus without complication ([place])   . Hypertension   . Pneumonia    hx  . Sleep apnea    cpap 4-5 yrs     Family History  Problem Relation Age of Onset  . Ovarian cancer Mother   . Heart attack Father   . Diabetes Father   . Hypertension Brother      Current Outpatient Medications:  .  acetaminophen (TYLENOL) 650 MG CR tablet, Take 1,300 mg by mouth every 8 (eight) hours as needed for pain., Disp: , Rfl:  .  albuterol (VENTOLIN HFA) 108 (90 Base) MCG/ACT inhaler, Inhale 2 puffs every 4 to 8 hours as  needed, Disp: 3 each, Rfl: 3 .  benzonatate (TESSALON PERLES) 100 MG capsule, Take 1 capsule (100 mg total) by mouth 3 (three) times daily as needed for cough., Disp: 30 capsule, Rfl: 1 .  Fe Fum-FePoly-Vit C-Vit B3 (INTEGRA) 62.5-62.5-40-3 MG CAPS, TAKE 1 CAPSULE BY MOUTH EVERY DAY, Disp: 90 capsule, Rfl: 1 .  glucose blood test strip, Use as instructed, Disp: 50 each, Rfl: 12 .  olmesartan-hydrochlorothiazide (BENICAR HCT) 20-12.5 MG tablet, TAKE 1 TABLET BY MOUTH EVERY DAY, Disp: 90 tablet, Rfl: 2 .  Liraglutide -Weight Management (SAXENDA) 18 MG/3ML SOPN, Inject 3 mg into the skin daily. (Patient not taking: Reported on 04/21/2022), Disp: 15 mL, Rfl: 1 .  Olopatadine HCl (PATADAY OP), Place 1 drop into both eyes daily as needed (allergies). (Patient not taking: Reported on 04/21/2022), Disp: , Rfl:  .  tirzepatide (ZEPBOUND) 5 MG/0.5ML Pen, INJECT 5 MG SUBCUTANEOUSLY WEEKLY (Patient not taking: Reported on 04/21/2022), Disp: 0.5 mL, Rfl: 1   No Known Allergies   Review of Systems  Constitutional: Negative.   Eyes:  Negative for blurred vision.  Respiratory: Negative.    Cardiovascular: Negative.  Negative for chest pain.  Gastrointestinal: Negative.   Skin: Negative.   Neurological: Negative.   Psychiatric/Behavioral: Negative.       Today's Vitals   04/21/22 1607  BP: 118/82  Pulse: 71  Temp: 97.9 F (36.6 C)  SpO2: 98%  Weight: Marland Kitchen)  342 lb (155.1 kg)  Height: 5' (1.524 m)   Body mass index is 66.79 kg/m.  Wt Readings from Last 3 Encounters:  04/21/22 (!) 342 lb (155.1 kg)  01/19/22 (!) 339 lb 3.2 oz (153.9 kg)  12/10/21 (!) 345 lb (156.5 kg)    Objective:  Physical Exam Vitals and nursing note reviewed.  Constitutional:      Appearance: Normal appearance.  HENT:     Head: Normocephalic and atraumatic.     Nose:     Comments: Masked     Mouth/Throat:     Comments: Masked  Eyes:     Extraocular Movements: Extraocular movements intact.  Cardiovascular:     Rate and  Rhythm: Normal rate and regular rhythm.     Heart sounds: Normal heart sounds.  Pulmonary:     Effort: Pulmonary effort is normal.     Breath sounds: Normal breath sounds.  Musculoskeletal:     Cervical back: Normal range of motion.  Skin:    General: Skin is warm.  Neurological:     General: No focal deficit present.     Mental Status: She is alert.  Psychiatric:        Mood and Affect: Mood normal.        Behavior: Behavior normal.     Assessment And Plan:     1. Type 2 diabetes mellitus with other specified complication, without long-term current use of insulin ([place])  2. Essential hypertension, benign  3. Influenza vaccination declined     Patient was given opportunity to ask questions. Patient verbalized understanding of the plan and was able to repeat key elements of the plan. All questions were answered to their satisfaction.   I, Maximino Greenland, MD, have reviewed all documentation for this visit. The documentation on 04/21/22 for the exam, diagnosis, procedures, and orders are all accurate and complete.   IF YOU HAVE BEEN REFERRED TO A SPECIALIST, IT MAY TAKE 1-2 WEEKS TO SCHEDULE/PROCESS THE REFERRAL. IF YOU HAVE NOT HEARD FROM US/SPECIALIST IN TWO WEEKS, PLEASE GIVE [place] A CALL AT [phone].   THE PATIENT IS ENCOURAGED TO PRACTICE SOCIAL DISTANCING DUE TO THE COVID-19 PANDEMIC.

## 2022-04-21 NOTE — Patient Instructions (Signed)

## 2022-04-22 LAB — BMP8+EGFR
BUN/Creatinine Ratio: 22 (ref 9–23)
BUN: 16 mg/dL (ref 6–24)
CO2: 23 mmol/L (ref 20–29)
Calcium: 10.1 mg/dL (ref 8.7–10.2)
Chloride: 102 mmol/L (ref 96–106)
Creatinine, Ser: 0.73 mg/dL (ref 0.57–1.00)
Glucose: 99 mg/dL (ref 70–99)
Potassium: 4.4 mmol/L (ref 3.5–5.2)
Sodium: 139 mmol/L (ref 134–144)
eGFR: 101 mL/min/{1.73_m2} (ref >59)

## 2022-04-22 LAB — HEMOGLOBIN A1C
Est. average glucose Bld gHb Est-mCnc: 123 mg/dL
Hgb A1c MFr Bld: 5.9 % — ABNORMAL HIGH (ref 4.8–5.6)

## 2022-05-05 ENCOUNTER — Other Ambulatory Visit: Payer: BC Managed Care – PPO

## 2022-05-20 ENCOUNTER — Encounter: Payer: Self-pay | Admitting: Internal Medicine

## 2022-05-20 ENCOUNTER — Other Ambulatory Visit: Payer: Self-pay

## 2022-05-20 MED ORDER — MOUNJARO 5 MG/0.5ML ~~LOC~~ SOAJ: 5.0000 mg | SUBCUTANEOUS | 0 refills | Status: DC

## 2022-05-22 ENCOUNTER — Telehealth: Payer: Self-pay

## 2022-05-22 ENCOUNTER — Other Ambulatory Visit: Payer: BC Managed Care – PPO

## 2022-05-22 NOTE — Telephone Encounter (Signed)
PA sent to plan. Waiting for determination.

## 2022-06-05 ENCOUNTER — Other Ambulatory Visit: Payer: Self-pay | Admitting: Pharmacist

## 2022-06-05 NOTE — Progress Notes (Unsigned)
Care Coordination Call  Verbal referral from Dr. Allyne Gee regarding medication management for this patient. Diagnosis of diabetes, interest in accessing GLP1/GIP with [facility] for this patient.   Previously tried: Ozempic (therapeutic failure); Victoza; metformin  PA form on Cover My Meds is requesting chart notes of patient's A1c having been at least 6.5% previously or OGTT >200.   Patient does have documentation of gestational diabetes with treatment with glyburide (Media tab -> OBGYN notes).   Contacted her health plan, submitted a verbal prior authorization noting that we do not have chart notes with the specific lab values noted above. Also asked to include diagnosis codes of sleep apnea. If PA is denied, we will be able to start an appeal and request a peer to peer between Dr. Allyne Gee and the plan.   Will await plan determination.   Catie Eppie Gibson, PharmD, BCACP, CPP [facility] [phone]

## 2022-06-16 ENCOUNTER — Emergency Department (HOSPITAL_COMMUNITY): Payer: BC Managed Care – PPO

## 2022-06-16 ENCOUNTER — Emergency Department (HOSPITAL_COMMUNITY)
Admission: EM | Admit: 2022-06-16 | Discharge: 2022-06-16 | Disposition: A | Discharge status: Home / Self Care | Payer: BC Managed Care – PPO | Attending: Emergency Medicine | Admitting: Emergency Medicine

## 2022-06-16 ENCOUNTER — Ambulatory Visit (HOSPITAL_COMMUNITY)
Admission: EM | Admit: 2022-06-16 | Discharge: 2022-06-16 | Disposition: A | Discharge status: Home / Self Care | Payer: BC Managed Care – PPO | Attending: Family Medicine | Admitting: Family Medicine

## 2022-06-16 ENCOUNTER — Encounter (HOSPITAL_COMMUNITY): Payer: Self-pay

## 2022-06-16 ENCOUNTER — Encounter (HOSPITAL_COMMUNITY): Payer: Self-pay | Admitting: Emergency Medicine

## 2022-06-16 ENCOUNTER — Other Ambulatory Visit: Payer: Self-pay

## 2022-06-16 DIAGNOSIS — R1084 Generalized abdominal pain: Secondary | ICD-10-CM | POA: Diagnosis not present

## 2022-06-16 DIAGNOSIS — I10 Essential (primary) hypertension: Secondary | ICD-10-CM | POA: Diagnosis not present

## 2022-06-16 DIAGNOSIS — E119 Type 2 diabetes mellitus without complications: Secondary | ICD-10-CM | POA: Diagnosis not present

## 2022-06-16 DIAGNOSIS — K5792 Diverticulitis of intestine, part unspecified, without perforation or abscess without bleeding: Secondary | ICD-10-CM

## 2022-06-16 DIAGNOSIS — Z9884 Bariatric surgery status: Secondary | ICD-10-CM | POA: Insufficient documentation

## 2022-06-16 DIAGNOSIS — K5732 Diverticulitis of large intestine without perforation or abscess without bleeding: Secondary | ICD-10-CM | POA: Insufficient documentation

## 2022-06-16 DIAGNOSIS — R109 Unspecified abdominal pain: Secondary | ICD-10-CM | POA: Diagnosis present

## 2022-06-16 LAB — URINALYSIS, ROUTINE W REFLEX MICROSCOPIC
Bilirubin Urine: NEGATIVE
Glucose, UA: NEGATIVE mg/dL
Hgb urine dipstick: NEGATIVE
Ketones, ur: 20 mg/dL — AB
Leukocytes,Ua: NEGATIVE
Nitrite: NEGATIVE
Protein, ur: NEGATIVE mg/dL
Specific Gravity, Urine: 1.017 (ref 1.005–1.030)
pH: 5 (ref 5.0–8.0)

## 2022-06-16 LAB — COMPREHENSIVE METABOLIC PANEL
ALT: 16 U/L (ref 0–44)
AST: 12 U/L — ABNORMAL LOW (ref 15–41)
Albumin: 3.4 g/dL — ABNORMAL LOW (ref 3.5–5.0)
Alkaline Phosphatase: 50 U/L (ref 38–126)
Anion gap: 8 (ref 5–15)
BUN: 10 mg/dL (ref 6–20)
CO2: 22 mmol/L (ref 22–32)
Calcium: 9.6 mg/dL (ref 8.9–10.3)
Chloride: 104 mmol/L (ref 98–111)
Creatinine, Ser: 0.72 mg/dL (ref 0.44–1.00)
GFR, Estimated: >60 mL/min (ref >60)
Glucose, Bld: 114 mg/dL — ABNORMAL HIGH (ref 70–99)
Potassium: 3.7 mmol/L (ref 3.5–5.1)
Sodium: 134 mmol/L — ABNORMAL LOW (ref 135–145)
Total Bilirubin: 1 mg/dL (ref 0.3–1.2)
Total Protein: 7.6 g/dL (ref 6.5–8.1)

## 2022-06-16 LAB — CBC WITH DIFFERENTIAL/PLATELET
Abs Immature Granulocytes: 0.05 10*3/uL (ref 0.00–0.07)
Basophils Absolute: 0 10*3/uL (ref 0.0–0.1)
Basophils Relative: 0 %
Eosinophils Absolute: 0 10*3/uL (ref 0.0–0.5)
Eosinophils Relative: 0 %
HCT: 34.6 % — ABNORMAL LOW (ref 36.0–46.0)
Hemoglobin: 10.8 g/dL — ABNORMAL LOW (ref 12.0–15.0)
Immature Granulocytes: 0 %
Lymphocytes Relative: 9 %
Lymphs Abs: 1.3 10*3/uL (ref 0.7–4.0)
MCH: 24.2 pg — ABNORMAL LOW (ref 26.0–34.0)
MCHC: 31.2 g/dL (ref 30.0–36.0)
MCV: 77.6 fL — ABNORMAL LOW (ref 80.0–100.0)
Monocytes Absolute: 1 10*3/uL (ref 0.1–1.0)
Monocytes Relative: 7 %
Neutro Abs: 12.2 10*3/uL — ABNORMAL HIGH (ref 1.7–7.7)
Neutrophils Relative %: 84 %
Platelets: 346 10*3/uL (ref 150–400)
RBC: 4.46 MIL/uL (ref 3.87–5.11)
RDW: 15.9 % — ABNORMAL HIGH (ref 11.5–15.5)
WBC: 14.6 10*3/uL — ABNORMAL HIGH (ref 4.0–10.5)
nRBC: 0 % (ref 0.0–0.2)

## 2022-06-16 LAB — I-STAT BETA HCG BLOOD, ED (MC, WL, AP ONLY): I-stat hCG, quantitative: 5.2 m[IU]/mL — ABNORMAL HIGH (ref <5)

## 2022-06-16 LAB — LIPASE, BLOOD: Lipase: 25 U/L (ref 11–51)

## 2022-06-16 MED ORDER — OXYCODONE-ACETAMINOPHEN 5-325 MG PO TABS
1.0000 | ORAL_TABLET | Freq: Once | ORAL | Status: AC
  Administered 2022-06-16: 1 via ORAL
  Filled 2022-06-16: qty 1

## 2022-06-16 MED ORDER — METRONIDAZOLE 500 MG PO TABS: 500.0000 mg | ORAL_TABLET | Freq: Two times a day (BID) | ORAL | 0 refills | Status: DC

## 2022-06-16 MED ORDER — SODIUM CHLORIDE (PF) 0.9 % IJ SOLN
INTRAMUSCULAR | Status: AC
  Filled 2022-06-16: qty 50

## 2022-06-16 MED ORDER — DOCUSATE SODIUM 250 MG PO CAPS: 250.0000 mg | ORAL_CAPSULE | Freq: Every day | ORAL | 0 refills | Status: AC

## 2022-06-16 MED ORDER — IOHEXOL 300 MG/ML  SOLN
100.0000 mL | Freq: Once | INTRAMUSCULAR | Status: AC | PRN
  Administered 2022-06-16: 100 mL via INTRAVENOUS

## 2022-06-16 MED ORDER — CIPROFLOXACIN HCL 500 MG PO TABS: 500.0000 mg | ORAL_TABLET | Freq: Two times a day (BID) | ORAL | 0 refills | Status: DC

## 2022-06-16 MED ORDER — OXYCODONE-ACETAMINOPHEN 5-325 MG PO TABS: 1.0000 | ORAL_TABLET | Freq: Four times a day (QID) | ORAL | 0 refills | Status: DC | PRN

## 2022-06-16 MED ORDER — MORPHINE SULFATE (PF) 4 MG/ML IV SOLN
4.0000 mg | Freq: Once | INTRAVENOUS | Status: AC
  Administered 2022-06-16: 4 mg via INTRAVENOUS
  Filled 2022-06-16: qty 1

## 2022-06-16 MED ORDER — ONDANSETRON HCL 4 MG/2ML IJ SOLN
4.0000 mg | Freq: Once | INTRAMUSCULAR | Status: AC
  Administered 2022-06-16: 4 mg via INTRAVENOUS
  Filled 2022-06-16: qty 2

## 2022-06-16 NOTE — ED Provider Notes (Signed)
[place] EMERGENCY DEPARTMENT AT [facility] Provider Note   CSN: [id number] Arrival date & time: 06/16/22  4098     History {Add pertinent medical, surgical, social history, OB history to HPI:1} Chief Complaint  Patient presents with   Abdominal Pain    Evelyn Wade is a 52 y.o. female past medical history of diabetes, hypertension presents today for evaluation of abdominal pain.  Reports unilateral pain in the last 7 days.  Last bowel movement was on the states last week.  Patient has tried multiple over-the-counter medication for constipation including Dulcolax, magnesium citrate, glycerin suppository with no relief.  She endorses nausea and vomiting when taking the magnesium citrate.  Reports decreased p.o. intake.  Denies fever, cough, runny nose, chest pain, shortness of breath, urinary symptoms, abnormal vaginal discharge, blood in her stool or urine.   Abdominal Pain     Past Medical History:  Diagnosis Date   Diabetes mellitus without complication (HCC)    Hypertension    Pneumonia    hx   Sleep apnea    cpap 4-5 yrs   Past Surgical History:  Procedure Laterality Date   BARIATRIC SURGERY     CESAREAN SECTION N/A 08/17/2014   Procedure: CESAREAN SECTION;  Surgeon: Osborn Coho, MD;  Location: [facility];  Service: Obstetrics;  Laterality: N/A;   COLONOSCOPY WITH PROPOFOL N/A 04/25/2021   Procedure: COLONOSCOPY WITH PROPOFOL;  Surgeon: Jeani Hawking, MD;  Location: [facility];  Service: Endoscopy;  Laterality: N/A;   KNEE ARTHROSCOPY Left    POLYPECTOMY  04/25/2021   Procedure: POLYPECTOMY;  Surgeon: Jeani Hawking, MD;  Location: [facility];  Service: Endoscopy;;   SUBMANDIBULAR GLAND EXCISION Left 09/07/2012   Procedure: EXCISION SUBMANDIBULAR GLAND;  Surgeon: Darletta Moll, MD;  Location: [facility] OR;  Service: ENT;  Laterality: Left;  Excision Submandibular Gland   TONSILLECTOMY       Home Medications Prior to Admission medications   Medication Sig Start  Date End Date Taking? Authorizing Provider  acetaminophen (TYLENOL) 650 MG CR tablet Take 1,300 mg by mouth every 8 (eight) hours as needed for pain.    [provider]  albuterol (VENTOLIN HFA) 108 (90 Base) MCG/ACT inhaler Inhale 2 puffs every 4 to 8 hours as needed 08/01/20   Dorothyann Peng, MD  benzonatate (TESSALON PERLES) 100 MG capsule Take 1 capsule (100 mg total) by mouth 3 (three) times daily as needed for cough. 12/10/21 12/10/22  Dorothyann Peng, MD  Fe Fum-FePoly-Vit C-Vit B3 (INTEGRA) 62.5-62.5-40-3 MG CAPS TAKE 1 CAPSULE BY MOUTH EVERY DAY 11/12/21   Dorothyann Peng, MD  glucose blood test strip Use as instructed 10/26/18   Dorothyann Peng, MD  olmesartan-hydrochlorothiazide [facility] HCT) 20-12.5 MG tablet TAKE 1 TABLET BY MOUTH EVERY DAY 02/06/22   Dorothyann Peng, MD  Olopatadine HCl (PATADAY OP) Place 1 drop into both eyes daily as needed (allergies). Patient not taking: Reported on 04/21/2022    [provider]  tirzepatide [facility]) 5 MG/0.5ML Pen Inject 5 mg into the skin once a week. 05/20/22   Dorothyann Peng, MD  tirzepatide Reginia Forts) 5 MG/0.5ML Pen INJECT 5 MG SUBCUTANEOUSLY WEEKLY Patient not taking: Reported on 04/21/2022 03/19/22   Dorothyann Peng, MD      Allergies    Patient has no known allergies.    Review of Systems   Review of Systems  Gastrointestinal:  Positive for abdominal pain.    Physical Exam Updated Vital Signs BP 137/83 (BP Location: Left Arm)   Pulse Marland Kitchen)  101   Temp 97.8 F (36.6 C) (Oral)   Resp 17   Ht 5' (1.524 m)   Wt (!) 149.7 kg   SpO2 99%   BMI 64.45 kg/m  Physical Exam  ED Results / Procedures / Treatments   Labs (all labs ordered are listed, but only abnormal results are displayed) Labs Reviewed  COMPREHENSIVE METABOLIC PANEL  LIPASE, BLOOD  CBC WITH DIFFERENTIAL/PLATELET  URINALYSIS, ROUTINE W REFLEX MICROSCOPIC  I-STAT BETA HCG BLOOD, ED ([facility], [facility], [facility] ONLY)  I-STAT BETA HCG BLOOD, ED ([facility], [facility], [facility] ONLY)     EKG None  Radiology No results found.  Procedures Procedures  {Document cardiac monitor, telemetry assessment procedure when appropriate:1}  Medications Ordered in ED Medications  ondansetron (ZOFRAN) injection 4 mg (has no administration in time range)  morphine (PF) 4 MG/ML injection 4 mg (has no administration in time range)    ED Course/ Medical Decision Making/ A&P   {   Click here for ABCD2, HEART and other calculatorsREFRESH Note before signing :1}                          Medical Decision Making Amount and/or Complexity of Data Reviewed Labs: ordered. Radiology: ordered.  Risk Prescription drug management.   ***  {Document critical care time when appropriate:1} {Document review of labs and clinical decision tools ie heart score, Chads2Vasc2 etc:1}  {Document your independent review of radiology images, and any outside records:1} {Document your discussion with family members, caretakers, and with consultants:1} {Document social determinants of health affecting pt's care:1} {Document your decision making why or why not admission, treatments were needed:1} Final Clinical Impression(s) / ED Diagnoses Final diagnoses:  None    Rx / DC Orders ED Discharge Orders     None

## 2022-06-16 NOTE — ED Triage Notes (Signed)
Patient has had generalized abdominal pain since Sunday. No radiation. No nausea, vomiting or diarrhea.

## 2022-06-16 NOTE — ED Triage Notes (Signed)
Pt reports generalized abdominal pain x 3 days. States she tried a suppository, enema fleet, and dulcolax with some relief. Last normal BM was last Tuesday.

## 2022-06-16 NOTE — ED Notes (Signed)
Patient is being discharged from the Urgent Care and sent to the Emergency Department via POV . Per Dr.Banister, patient is in need of higher level of care due to abdominal pain. Patient is aware and verbalizes understanding of plan of care.  Vitals:   06/16/22 0829  BP: 120/78  Pulse: (!) 105  Resp: 18  Temp: 98.1 F (36.7 C)  SpO2: 98%

## 2022-06-16 NOTE — ED Provider Notes (Signed)
[facility]    CSN: [id number] Arrival date & time: 06/16/22  0818      History   Chief Complaint Chief Complaint  Patient presents with   Abdominal Pain   Constipation    HPI Evelyn Wade is a 52 y.o. female.    Abdominal Pain Associated symptoms: constipation   Constipation Associated symptoms: abdominal pain    Here for severe abdominal pain.  It has been bothering her in the last 5 to 7 days.  Last normal bowel movement was April 30.  She has not had any nausea or vomiting (except as noted below), but she has had no appetite and has not been eating in the last week.  She has managed to have a small bowel movement yesterday with the help of a suppository.  She threw up mag citrate  No fever or chills and no dysuria or hematuria  Last menstrual cycle was early April.  Past Medical History:  Diagnosis Date   Diabetes mellitus without complication (HCC)    Hypertension    Pneumonia    hx   Sleep apnea    cpap 4-5 yrs    Patient Active Problem List   Diagnosis Date Noted   Essential hypertension, benign 01/21/2020   Severe obstructive sleep apnea-hypopnea syndrome 01/09/2019   Super obesity 11/08/2018   Snoring 11/08/2018   Anemia 08/20/2014   S/P primary low transverse C-section 08/17/2014   Short stature 08/16/2014   AMA (advanced maternal age) multigravida 35+ 08/15/2014   Chronic hypertension 08/15/2014   Rubella non-immune status, antepartum 08/15/2014   Diabetes (HCC) 08/14/2012   Class 3 severe obesity due to excess calories with serious comorbidity and body mass index (BMI) of 60.0 to 69.9 in adult [facility]) 08/14/2012    Past Surgical History:  Procedure Laterality Date   BARIATRIC SURGERY     CESAREAN SECTION N/A 08/17/2014   Procedure: CESAREAN SECTION;  Surgeon: Osborn Coho, MD;  Location: [facility];  Service: Obstetrics;  Laterality: N/A;   COLONOSCOPY WITH PROPOFOL N/A 04/25/2021   Procedure: COLONOSCOPY WITH PROPOFOL;  Surgeon: Jeani Hawking, MD;  [facility];  Service: Endoscopy;  Laterality: N/A;   KNEE ARTHROSCOPY Left    POLYPECTOMY  04/25/2021   Procedure: POLYPECTOMY;  Surgeon: Jeani Hawking, MD;  [facility];  Service: Endoscopy;;   SUBMANDIBULAR GLAND EXCISION Left 09/07/2012   Procedure: EXCISION SUBMANDIBULAR GLAND;  Surgeon: Darletta Moll, MD;  Location: [facility] OR;  Service: ENT;  Laterality: Left;  Excision Submandibular Gland   TONSILLECTOMY      OB History     Gravida  1   Para  1   Term  1   Preterm  0   AB  0   Living  1      SAB  0   IAB  0   Ectopic  0   Multiple  0   Live Births  1            Home Medications    Prior to Admission medications   Medication Sig Start Date End Date Taking? Authorizing Provider  acetaminophen (TYLENOL) 650 MG CR tablet Take 1,300 mg by mouth every 8 (eight) hours as needed for pain.    [provider]  albuterol (VENTOLIN HFA) 108 (90 Base) MCG/ACT inhaler Inhale 2 puffs every 4 to 8 hours as needed 08/01/20   Dorothyann Peng, MD  benzonatate (TESSALON PERLES) 100 MG capsule Take 1 capsule (100 mg total) by  mouth 3 (three) times daily as needed for cough. 12/10/21 12/10/22  Dorothyann Peng, MD  Fe Fum-FePoly-Vit C-Vit B3 (INTEGRA) 62.5-62.5-40-3 MG CAPS TAKE 1 CAPSULE BY MOUTH EVERY DAY 11/12/21   Dorothyann Peng, MD  glucose blood test strip Use as instructed 10/26/18   Dorothyann Peng, MD  olmesartan-hydrochlorothiazide [facility] HCT) 20-12.5 MG tablet TAKE 1 TABLET BY MOUTH EVERY DAY 02/06/22   Dorothyann Peng, MD  Olopatadine HCl (PATADAY OP) Place 1 drop into both eyes daily as needed (allergies). Patient not taking: Reported on 04/21/2022    [provider]  tirzepatide [facility]) 5 MG/0.5ML Pen Inject 5 mg into the skin once a week. 05/20/22   Dorothyann Peng, MD  tirzepatide Reginia Forts) 5 MG/0.5ML Pen INJECT 5 MG SUBCUTANEOUSLY WEEKLY Patient not taking: Reported on 04/21/2022 03/19/22   Dorothyann Peng, MD    Family  History Family History  Problem Relation Age of Onset   Ovarian cancer Mother    Heart attack Father    Diabetes Father    Hypertension Brother     Social History Social History   Tobacco Use   Smoking status: Never   Smokeless tobacco: Never  Vaping Use   Vaping Use: Never used  Substance Use Topics   Alcohol use: No   Drug use: No     Allergies   Patient has no known allergies.   Review of Systems Review of Systems  Gastrointestinal:  Positive for abdominal pain and constipation.     Physical Exam Triage Vital Signs ED Triage Vitals  Enc Vitals Group     BP 06/16/22 0829 120/78     Pulse Rate 06/16/22 0829 (!) 105     Resp 06/16/22 0829 18     Temp 06/16/22 0829 98.1 F (36.7 C)     Temp Source 06/16/22 0829 Oral     SpO2 06/16/22 0829 98 %     Weight --      Height --      Head Circumference --      Peak Flow --      Pain Score 06/16/22 0828 8     Pain Loc --      Pain Edu? --      Excl. in GC? --    No data found.  Updated Vital Signs BP 120/78 (BP Location: Left Arm)   Pulse (!) 105   Temp 98.1 F (36.7 C) (Oral)   Resp 18   SpO2 98%   Visual Acuity Right Eye Distance:   Left Eye Distance:   Bilateral Distance:    Right Eye Near:   Left Eye Near:    Bilateral Near:     Physical Exam Vitals reviewed.  Constitutional:      General: She is not in acute distress.    Appearance: She is not toxic-appearing.  HENT:     Nose: Nose normal.     Mouth/Throat:     Mouth: Mucous membranes are moist.     Pharynx: No oropharyngeal exudate or posterior oropharyngeal erythema.  Eyes:     Extraocular Movements: Extraocular movements intact.     Conjunctiva/sclera: Conjunctivae normal.     Pupils: Pupils are equal, round, and reactive to light.  Cardiovascular:     Rate and Rhythm: Normal rate and regular rhythm.     Heart sounds: No murmur heard. Pulmonary:     Effort: Pulmonary effort is normal. No respiratory distress.     Breath sounds:  No stridor. No wheezing, rhonchi or rales.  Abdominal:     Tenderness: There is abdominal tenderness (generalized). There is guarding.  Musculoskeletal:     Cervical back: Neck supple.  Lymphadenopathy:     Cervical: No cervical adenopathy.  Skin:    Capillary Refill: Capillary refill takes less than 2 seconds.     Coloration: Skin is not jaundiced or pale.  Neurological:     General: No focal deficit present.     Mental Status: She is alert and oriented to person, place, and time.  Psychiatric:        Behavior: Behavior normal.      UC Treatments / Results  Labs (all labs ordered are listed, but only abnormal results are displayed) Labs Reviewed - No data to display  EKG   Radiology No results found.  Procedures Procedures (including critical care time)  Medications Ordered in UC Medications - No data to display  Initial Impression / Assessment and Plan / UC Course  I have reviewed the triage vital signs and the nursing notes.  Pertinent labs & imaging results that were available during my care of the patient were reviewed by me and considered in my medical decision making (see chart for details).        With her tenderness and fairly severe abdominal pain, I feel that she needs to be evaluated in the emergency room with a higher level of evaluation and treatment then we can provide here in urgent care setting.  She will proceed to the emergency room by private vehicle Final Clinical Impressions(s) / UC Diagnoses   Final diagnoses:  Generalized abdominal pain     Discharge Instructions      Please proceed to the emergency room for further evaluation   ED Prescriptions   None    PDMP not reviewed this encounter.   Zenia Resides, MD 06/16/22 [phone]

## 2022-06-16 NOTE — Discharge Instructions (Signed)
Please proceed to the emergency room for further evaluation 

## 2022-06-16 NOTE — Discharge Instructions (Addendum)
Please follow up with your primary care doctor within 2-3 days. For constipation we also recommend a diet high in fiber (beans, fruits, vegetables, whole grains). Take Colace 100-200 mg up to three times per day. You may take along with Senokot 1-2 tabs, ingest with full glass of water.  You may also take MiraLAX 1-2 capfuls 1-2 times a day until stools become soft and then slowly decrease the amount of MiraLAX used.  Maintain fluid intake 6-8 glasses per day. Please increase fibers in your diet. You may also take Milk of Magnesia 30 mL as needed for constipation, you may repeat in 2 hours again if no bowl movement. 

## 2022-06-17 ENCOUNTER — Other Ambulatory Visit: Payer: BC Managed Care – PPO

## 2022-06-18 ENCOUNTER — Encounter: Payer: Self-pay | Admitting: Internal Medicine

## 2022-06-18 ENCOUNTER — Telehealth (INDEPENDENT_AMBULATORY_CARE_PROVIDER_SITE_OTHER): Payer: BC Managed Care – PPO | Admitting: Internal Medicine

## 2022-06-18 ENCOUNTER — Telehealth: Payer: Self-pay

## 2022-06-18 DIAGNOSIS — K5909 Other constipation: Secondary | ICD-10-CM

## 2022-06-18 DIAGNOSIS — K5792 Diverticulitis of intestine, part unspecified, without perforation or abscess without bleeding: Secondary | ICD-10-CM | POA: Diagnosis not present

## 2022-06-18 MED ORDER — CEFTRIAXONE SODIUM 1 G IJ SOLR
1.0000 g | Freq: Once | INTRAMUSCULAR | Status: AC
Start: 2022-06-18 — End: 2022-06-18
  Administered 2022-06-18: 1 g via INTRAMUSCULAR

## 2022-06-18 MED ORDER — OXYCODONE-ACETAMINOPHEN 5-325 MG PO TABS: 1.0000 | ORAL_TABLET | Freq: Four times a day (QID) | ORAL | 0 refills | Status: AC | PRN

## 2022-06-18 MED ORDER — KETOROLAC TROMETHAMINE 60 MG/2ML IM SOLN
60.0000 mg | Freq: Once | INTRAMUSCULAR | Status: AC
Start: 2022-06-18 — End: 2022-06-18
  Administered 2022-06-18: 60 mg via INTRAMUSCULAR

## 2022-06-18 MED ORDER — ONDANSETRON HCL 4 MG PO TABS: 4.0000 mg | ORAL_TABLET | Freq: Three times a day (TID) | ORAL | 0 refills | Status: AC | PRN

## 2022-06-18 NOTE — Transitions of Care (Post Inpatient/ED Visit) (Signed)
06/18/2022  Name: Evelyn Wade MRN: [id number] DOB: November 20, 1970  Today's TOC FU Call Status:    Transition Care Management Follow-up Telephone Call Date of Discharge: 06/16/22 Discharge Facility: [facility]) Type of Discharge: Emergency Department Reason for ED Visit: Other: How have you been since you were released from the hospital?: Same Any questions or concerns?: No  Items Reviewed: Did you receive and understand the discharge instructions provided?: Yes Medications obtained,verified, and reconciled?: Yes (Medications Reviewed) Any new allergies since your discharge?: No Dietary orders reviewed?: Yes Type of Diet Ordered:: N/A Do you have support at home?: Yes People in Home: spouse Name of Support/Comfort Primary Source: Meryl Crutch  Medications Reviewed Today: Medications Reviewed Today     Reviewed by Coolidge Breeze, CMA (Certified Medical Assistant) on 06/18/22 at 1248  Med List Status: <None>   Medication Order Taking? Sig Documenting Provider Last Dose Status Informant  acetaminophen (TYLENOL) 650 MG CR tablet [id number] Yes Take 1,300 mg by mouth every 8 (eight) hours as needed for pain. [provider] Taking Active Self  albuterol (VENTOLIN HFA) 108 (90 Base) MCG/ACT inhaler [id number] Yes Inhale 2 puffs every 4 to 8 hours as needed Dorothyann Peng, MD Taking Active Self  benzonatate (TESSALON PERLES) 100 MG capsule [id number] Yes Take 1 capsule (100 mg total) by mouth 3 (three) times daily as needed for cough. Dorothyann Peng, MD Taking Active   ciprofloxacin (CIPRO) 500 MG tablet [id number] Yes Take 1 tablet (500 mg total) by mouth every 12 (twelve) hours. Jeanelle Malling, PA Taking Active   docusate sodium (COLACE) 250 MG capsule [id number] Yes Take 1 capsule (250 mg total) by mouth daily. Jeanelle Malling, PA Taking Active   Fe Fum-FePoly-Vit C-Vit B3 (INTEGRA) 62.5-62.5-40-3 MG CAPS [id number] Yes TAKE 1 CAPSULE BY MOUTH EVERY DAY Dorothyann Peng, MD Taking  Active   glucose blood test strip [id number] Yes Use as instructed Dorothyann Peng, MD Taking Active Self  metroNIDAZOLE (FLAGYL) 500 MG tablet [id number] Yes Take 1 tablet (500 mg total) by mouth 2 (two) times daily. Jeanelle Malling, PA Taking Active   olmesartan-hydrochlorothiazide [facility] HCT) 20-12.5 MG tablet [id number] Yes TAKE 1 TABLET BY MOUTH EVERY DAY Dorothyann Peng, MD Taking Active   Olopatadine HCl (PATADAY OP) [id number]  Place 1 drop into both eyes daily as needed (allergies).  Patient not taking: Reported on 04/21/2022   [provider]  Active Self  oxyCODONE-acetaminophen (PERCOCET/ROXICET) 5-325 MG tablet [id number] Yes Take 1 tablet by mouth every 6 (six) hours as needed for up to 10 doses for severe pain. Jeanelle Malling, PA Taking Active   tirzepatide [facility]) 5 MG/0.5ML Pen [id number] Yes Inject 5 mg into the skin once a week. Dorothyann Peng, MD Taking Active   tirzepatide [facility]) 5 MG/0.5ML Pen [id number] No INJECT 5 MG SUBCUTANEOUSLY WEEKLY  Patient not taking: Reported on 04/21/2022   Dorothyann Peng, MD Not Taking Active             Home Care and Equipment/Supplies: Were Home Health Services Ordered?: No Any new equipment or medical supplies ordered?: No  Functional Questionnaire: Do you need assistance with bathing/showering or dressing?: No Do you need assistance with meal preparation?: No Do you need assistance with eating?: No Do you have difficulty maintaining continence: No Do you need assistance with getting out of bed/getting out of a chair/moving?: No Do you have difficulty managing or taking your medications?: No  Follow up appointments reviewed: PCP Follow-up appointment confirmed?: Yes Date of PCP follow-up  appointment?: 06/18/22 Follow-up Provider: Dorothyann Peng Specialist [facility] Follow-up appointment confirmed?: No Reason Specialist Follow-Up Not Confirmed: Patient has Specialist Provider Number and will Call for Appointment Do you need  transportation to your follow-up appointment?: No Do you understand care options if your condition(s) worsen?: Yes-patient verbalized understanding    SIGNATURE: Randa Lynn, CMA

## 2022-06-18 NOTE — Progress Notes (Signed)
Virtual Visit via Video   This visit type was conducted due to national recommendations for restrictions regarding the COVID-19 Pandemic (e.g. social distancing) in an effort to limit this patient's exposure and mitigate transmission in our community.  Due to her co-morbid illnesses, this patient is at least at moderate risk for complications without adequate follow up.  This format is felt to be most appropriate for this patient at this time.  All issues noted in this document were discussed and addressed.  A limited physical exam was performed with this format.    This visit type was conducted due to national recommendations for restrictions regarding the COVID-19 Pandemic (e.g. social distancing) in an effort to limit this patient's exposure and mitigate transmission in our community.  Patients identity confirmed using two different identifiers.  This format is felt to be most appropriate for this patient at this time.  All issues noted in this document were discussed and addressed.  No physical exam was performed (except for noted visual exam findings with Video Visits).    Date:  06/29/2022   ID:  Evelyn Wade, DOB December 06, 1970, MRN [id number]  Patient Location:  Home  Provider location:   Office    Chief Complaint:  "I have ER f/u"  History of Present Illness:    Evelyn Wade is a 52 y.o. female who presents via video conferencing for a telehealth visit today.    The patient does not have symptoms concerning for COVID-19 infection (fever, chills, cough, or new shortness of breath).   Patient presents virtually today for ED follow up. She went to [facility] on 05/07 for evaluation of worsening abdominal pain. Workup included CT abd/pelvis, results significant for acute diverticulitis. She was given rx Cipro/Flagyl - unfortunately, she has not had much improvement in her symptoms. No fever/chills at the moment. She still experiences a lot of pain today, she rates 8/10.    She reports taking Oxycodone given to her at the hospital. This has not provided much relief unfortunately.      Past Medical History:  Diagnosis Date   Diabetes mellitus without complication (HCC)    Hypertension    Pneumonia    hx   Sleep apnea    cpap 4-5 yrs   Past Surgical History:  Procedure Laterality Date   BARIATRIC SURGERY     CESAREAN SECTION N/A 08/17/2014   Procedure: CESAREAN SECTION;  Surgeon: Osborn Coho, MD;  Location: [facility] ORS;  Service: Obstetrics;  Laterality: N/A;   COLONOSCOPY WITH PROPOFOL N/A 04/25/2021   Procedure: COLONOSCOPY WITH PROPOFOL;  Surgeon: Jeani Hawking, MD;  Location: [facility] ENDOSCOPY;  Service: Endoscopy;  Laterality: N/A;   KNEE ARTHROSCOPY Left    POLYPECTOMY  04/25/2021   Procedure: POLYPECTOMY;  Surgeon: Jeani Hawking, MD;  Location: [facility] ENDOSCOPY;  Service: Endoscopy;;   SUBMANDIBULAR GLAND EXCISION Left 09/07/2012   Procedure: EXCISION SUBMANDIBULAR GLAND;  Surgeon: Darletta Moll, MD;  Location: [facility] OR;  Service: ENT;  Laterality: Left;  Excision Submandibular Gland   TONSILLECTOMY       Current Meds  Medication Sig   acetaminophen (TYLENOL) 650 MG CR tablet Take 1,300 mg by mouth every 8 (eight) hours as needed for pain.   albuterol (VENTOLIN HFA) 108 (90 Base) MCG/ACT inhaler Inhale 2 puffs every 4 to 8 hours as needed   benzonatate (TESSALON PERLES) 100 MG capsule Take 1 capsule (100 mg total) by mouth 3 (three) times daily as needed for cough.   ciprofloxacin (  CIPRO) 500 MG tablet Take 1 tablet (500 mg total) by mouth every 12 (twelve) hours.   docusate sodium (COLACE) 250 MG capsule Take 1 capsule (250 mg total) by mouth daily.   Fe Fum-FePoly-Vit C-Vit B3 (INTEGRA) 62.5-62.5-40-3 MG CAPS TAKE 1 CAPSULE BY MOUTH EVERY DAY   glucose blood test strip Use as instructed   metroNIDAZOLE (FLAGYL) 500 MG tablet Take 1 tablet (500 mg total) by mouth 2 (two) times daily.   olmesartan-hydrochlorothiazide (BENICAR HCT) 20-12.5 MG tablet TAKE 1  TABLET BY MOUTH EVERY DAY   ondansetron (ZOFRAN) 4 MG tablet Take 1 tablet (4 mg total) by mouth every 8 (eight) hours as needed for nausea or vomiting.   tirzepatide (MOUNJARO) 5 MG/0.5ML Pen Inject 5 mg into the skin once a week.   [DISCONTINUED] oxyCODONE-acetaminophen (PERCOCET/ROXICET) 5-325 MG tablet Take 1 tablet by mouth every 6 (six) hours as needed for up to 10 doses for severe pain.     Allergies:   Patient has no known allergies.   Social History   Tobacco Use   Smoking status: Never   Smokeless tobacco: Never  Vaping Use   Vaping Use: Never used  Substance Use Topics   Alcohol use: No   Drug use: No     Family Hx: The patient's family history includes Diabetes in her father; Heart attack in her father; Hypertension in her brother; Ovarian cancer in her mother.  ROS:   Please see the history of present illness.    Review of Systems  Constitutional: Negative.   HENT: Negative.    Respiratory: Negative.    Gastrointestinal:  Positive for abdominal pain.  Genitourinary: Negative.   Neurological: Negative.   Endo/Heme/Allergies: Negative.   Psychiatric/Behavioral: Negative.      All other systems reviewed and are negative.   Labs/Other Tests and Data Reviewed:    Recent Labs: 01/19/2022: TSH 0.816 06/16/2022: ALT 16; BUN 10; Creatinine, Ser 0.72; Hemoglobin 10.8; Platelets 346; Potassium 3.7; Sodium 134   Recent Lipid Panel Lab Results  Component Value Date/Time   CHOL 182 01/19/2022 11:41 AM   TRIG 73 01/19/2022 11:41 AM   HDL 99 01/19/2022 11:41 AM   CHOLHDL 1.8 01/19/2022 11:41 AM   LDLCALC 70 01/19/2022 11:41 AM    Wt Readings from Last 3 Encounters:  06/16/22 (!) 330 lb (149.7 kg)  04/21/22 (!) 342 lb (155.1 kg)  01/19/22 (!) 339 lb 3.2 oz (153.9 kg)     Exam:    Vital Signs:  LMP 05/15/2022 Comment: per Dr Conley Rolls, this is a false positive test and to proceed    Physical Exam Vitals and nursing note reviewed.  Constitutional:      General:  She is in acute distress.     Appearance: She is obese.     Comments: She appears uncomfortable  HENT:     Head: Normocephalic and atraumatic.  Eyes:     Extraocular Movements: Extraocular movements intact.  Pulmonary:     Effort: Pulmonary effort is normal.  Musculoskeletal:     Cervical back: Normal range of motion.  Neurological:     Mental Status: She is alert and oriented to person, place, and time.  Psychiatric:        Mood and Affect: Affect normal.     ASSESSMENT & PLAN:     1. Acute diverticulitis Comments: Unfortunately, she is still in a great deal of pain. She agrees to come in for Toradol injection. She was prescribed Percocet by ER MD.  She also agrees to Rocephin injection. Her husband will bring her to the office later today. I will also send her refill of Percocet to use prn.  - ketorolac (TORADOL) injection 60 mg - cefTRIAXone (ROCEPHIN) injection 1 g  2. Chronic constipation Comments: After this acute flare, she agrees to start Miralax to help with bowel regularity.    COVID-19 Education: The signs and symptoms of COVID-19 were discussed with the patient and how to seek care for testing (follow up with PCP or arrange E-visit).  The importance of social distancing was discussed today.  Patient Risk:   After full review of this patients clinical status, I feel that they are at least moderate risk at this time.  Time:   Today, I have spent 15 minutes/ seconds with the patient with telehealth technology discussing above diagnoses.     Medication Adjustments/Labs and Tests Ordered: Current medicines are reviewed at length with the patient today.  Concerns regarding medicines are outlined above.   Tests Ordered: No orders of the defined types were placed in this encounter.   Medication Changes: Meds ordered this encounter  Medications   ondansetron (ZOFRAN) 4 MG tablet    Sig: Take 1 tablet (4 mg total) by mouth every 8 (eight) hours as needed for nausea  or vomiting.    Dispense:  20 tablet    Refill:  0   ketorolac (TORADOL) injection 60 mg   oxyCODONE-acetaminophen (PERCOCET/ROXICET) 5-325 MG tablet    Sig: Take 1 tablet by mouth every 6 (six) hours as needed for up to 5 days for severe pain.    Dispense:  15 tablet    Refill:  0   cefTRIAXone (ROCEPHIN) injection 1 g    Disposition:  Follow up prn  Signed, Gwynneth Aliment, MD

## 2022-06-18 NOTE — Patient Instructions (Signed)

## 2022-06-20 ENCOUNTER — Encounter: Payer: Self-pay | Admitting: Internal Medicine

## 2022-06-22 ENCOUNTER — Ambulatory Visit: Payer: BC Managed Care – PPO | Admitting: Internal Medicine

## 2022-07-31 ENCOUNTER — Other Ambulatory Visit (HOSPITAL_COMMUNITY): Payer: Self-pay

## 2022-07-31 MED ORDER — TRAMADOL HCL 50 MG PO TABS
50.0000 mg | ORAL_TABLET | Freq: Three times a day (TID) | ORAL | 0 refills | Status: AC
  Filled 2022-07-31: qty 30, 10d supply, fill #0

## 2022-08-06 ENCOUNTER — Encounter: Payer: Self-pay | Admitting: Internal Medicine

## 2022-08-06 ENCOUNTER — Ambulatory Visit (INDEPENDENT_AMBULATORY_CARE_PROVIDER_SITE_OTHER): Payer: BC Managed Care – PPO | Admitting: Family Medicine

## 2022-08-06 ENCOUNTER — Encounter: Payer: Self-pay | Admitting: Family Medicine

## 2022-08-06 VITALS — BP 120/80 | HR 89 | Temp 98.4°F | Ht 60.0 in | Wt 320.0 lb

## 2022-08-06 DIAGNOSIS — Z6841 Body Mass Index (BMI) 40.0 and over, adult: Secondary | ICD-10-CM

## 2022-08-06 DIAGNOSIS — K5792 Diverticulitis of intestine, part unspecified, without perforation or abscess without bleeding: Secondary | ICD-10-CM | POA: Diagnosis not present

## 2022-08-06 MED ORDER — AMOXICILLIN-POT CLAVULANATE 875-125 MG PO TABS
1.0000 | ORAL_TABLET | Freq: Two times a day (BID) | ORAL | 0 refills | Status: AC
Start: 2022-08-06 — End: 2022-08-16

## 2022-08-06 NOTE — Progress Notes (Signed)
I,[name], CMA,acting as a Neurosurgeon for [employer], NP.,have documented all relevant documentation on the behalf of Nadya Hopwood, NP,as directed by  Jan Walters Moshe Salisbury, NP while in the presence of Sheala Dosh, NP.  Subjective:  Patient ID: Evelyn Wade , female    DOB: Jun 30, 1970 , 52 y.o.   MRN: [id number]  Chief Complaint  Patient presents with   Abdominal Pain    HPI  Patient presents today for abdominal pain. She reports she feels like she is about to have another diverticulitis flare up. She reports the pain started yesterday, with aching and cramping and it is on the left lower quardrant, it feels like how she felt when she went to the ER the last time she had an episode.  Abdominal Pain The current episode started yesterday. The onset quality is sudden. The pain is located in the LLQ. The pain is at a severity of 9/10. The quality of the pain is aching and cramping. The abdominal pain radiates to the LLQ. Associated symptoms include constipation and flatus. The pain is aggravated by certain positions. The pain is relieved by Being still. Prior diagnostic workup includes CT scan.     Past Medical History:  Diagnosis Date   Diabetes mellitus without complication (HCC)    Hypertension    Pneumonia    hx   Sleep apnea    cpap 4-5 yrs     Family History  Problem Relation Age of Onset   Ovarian cancer Mother    Heart attack Father    Diabetes Father    Hypertension Brother      Current Outpatient Medications:    acetaminophen (TYLENOL) 650 MG CR tablet, Take 1,300 mg by mouth every 8 (eight) hours as needed for pain., Disp: , Rfl:    albuterol (VENTOLIN HFA) 108 (90 Base) MCG/ACT inhaler, Inhale 2 puffs every 4 to 8 hours as needed, Disp: 3 each, Rfl: 3   amoxicillin-clavulanate (AUGMENTIN) 875-125 MG tablet, Take 1 tablet by mouth 2 (two) times daily for 10 days., Disp: 20 tablet, Rfl: 0   benzonatate (TESSALON PERLES) 100 MG capsule, Take 1 capsule (100 mg total) by  mouth 3 (three) times daily as needed for cough., Disp: 30 capsule, Rfl: 1   docusate sodium (COLACE) 250 MG capsule, Take 1 capsule (250 mg total) by mouth daily., Disp: 10 capsule, Rfl: 0   Fe Fum-FePoly-Vit C-Vit B3 (INTEGRA) 62.5-62.5-40-3 MG CAPS, TAKE 1 CAPSULE BY MOUTH EVERY DAY, Disp: 90 capsule, Rfl: 1   glucose blood test strip, Use as instructed, Disp: 50 each, Rfl: 12   olmesartan-hydrochlorothiazide (BENICAR HCT) 20-12.5 MG tablet, TAKE 1 TABLET BY MOUTH EVERY DAY, Disp: 90 tablet, Rfl: 2   Olopatadine HCl (PATADAY OP), Place 1 drop into both eyes daily as needed (allergies)., Disp: , Rfl:    ondansetron (ZOFRAN) 4 MG tablet, Take 1 tablet (4 mg total) by mouth every 8 (eight) hours as needed for nausea or vomiting., Disp: 20 tablet, Rfl: 0   traMADol (ULTRAM) 50 MG tablet, Take 1 tablet (50 mg total) by mouth 3 (three) times daily when necessary, Disp: 30 tablet, Rfl: 0   No Known Allergies   Review of Systems  Constitutional: Negative.   Eyes: Negative.   Respiratory: Negative.    Cardiovascular: Negative.   Gastrointestinal:  Positive for abdominal pain, constipation and flatus.  Genitourinary: Negative.   Musculoskeletal: Negative.   Skin: Negative.   Psychiatric/Behavioral: Negative.       Today's Vitals  08/06/22 1602  BP: 120/80  Pulse: 89  Temp: 98.4 F (36.9 C)  Weight: (!) 320 lb (145.2 kg)  Height: 5' (1.524 m)  PainSc: 6   PainLoc: Abdomen   Body mass index is 62.5 kg/m.  Wt Readings from Last 3 Encounters:  08/06/22 (!) 320 lb (145.2 kg)  06/16/22 (!) 330 lb (149.7 kg)  04/21/22 (!) 342 lb (155.1 kg)     Objective:  Physical Exam Constitutional:      Appearance: Normal appearance. She is obese.  Pulmonary:     Effort: Pulmonary effort is normal. No respiratory distress.  Abdominal:     Tenderness: There is abdominal tenderness in the left lower quadrant.  Neurological:     General: No focal deficit present.     Mental Status: She is alert  and oriented to person, place, and time. Mental status is at baseline.  Psychiatric:        Mood and Affect: Mood normal.         Assessment And Plan:  Acute diverticulitis Assessment & Plan: Try liquid diet e.g broth,soup,nutrition supplements such as Boost,Ensure until symptoms like pain subsides in order to give the bowel some rest.  Orders: -     Amoxicillin-Pot Clavulanate; Take 1 tablet by mouth 2 (two) times daily for 10 days.  Dispense: 20 tablet; Refill: 0  Morbid obesity with BMI of 60.0-69.9, adult [facility]) Assessment & Plan: She is encouraged to strive for BMI less than 30 to decrease cardiac risk. Advised to aim for at least 150 minutes of exercise per week.       Return if symptoms worsen or fail to improve, for keep up coming appt.  Patient was given opportunity to ask questions. Patient verbalized understanding of the plan and was able to repeat key elements of the plan. All questions were answered to their satisfaction.  Lavonia Eager Moshe Salisbury, NP  I, Murlene Revell Moshe Salisbury, NP, have reviewed all documentation for this visit. The documentation on 08/10/22 for the exam, diagnosis, procedures, and orders are all accurate and complete.   IF YOU HAVE BEEN REFERRED TO A SPECIALIST, IT MAY TAKE 1-2 WEEKS TO SCHEDULE/PROCESS THE REFERRAL. IF YOU HAVE NOT HEARD FROM US/SPECIALIST IN TWO WEEKS, PLEASE GIVE [place] A CALL AT [phone] X [phone].   THE PATIENT IS ENCOURAGED TO PRACTICE SOCIAL DISTANCING DUE TO THE COVID-19 PANDEMIC.

## 2022-08-10 ENCOUNTER — Encounter: Payer: Self-pay | Admitting: Family Medicine

## 2022-08-10 ENCOUNTER — Ambulatory Visit
Admission: RE | Admit: 2022-08-10 | Discharge: 2022-08-10 | Disposition: A | Discharge status: Home / Self Care | Payer: BC Managed Care – PPO | Source: Ambulatory Visit | Attending: Nurse Practitioner | Admitting: Nurse Practitioner

## 2022-08-10 DIAGNOSIS — N83292 Other ovarian cyst, left side: Secondary | ICD-10-CM

## 2022-08-10 DIAGNOSIS — K5792 Diverticulitis of intestine, part unspecified, without perforation or abscess without bleeding: Secondary | ICD-10-CM | POA: Insufficient documentation

## 2022-08-10 NOTE — Assessment & Plan Note (Signed)
She is encouraged to strive for BMI less than 30 to decrease cardiac risk. Advised to aim for at least 150 minutes of exercise per week.  

## 2022-08-10 NOTE — Assessment & Plan Note (Signed)
Try liquid diet e.g broth,soup,nutrition supplements such as Boost,Ensure until symptoms like pain subsides in order to give the bowel some rest.

## 2022-08-11 ENCOUNTER — Other Ambulatory Visit (HOSPITAL_COMMUNITY): Payer: Self-pay

## 2022-08-24 ENCOUNTER — Ambulatory Visit: Payer: BC Managed Care – PPO | Admitting: Internal Medicine

## 2022-08-24 ENCOUNTER — Encounter: Payer: Self-pay | Admitting: Internal Medicine

## 2022-08-24 VITALS — BP 118/84 | HR 82 | Temp 98.5°F | Ht 60.0 in | Wt 321.0 lb

## 2022-08-24 DIAGNOSIS — K5909 Other constipation: Secondary | ICD-10-CM | POA: Diagnosis not present

## 2022-08-24 DIAGNOSIS — I10 Essential (primary) hypertension: Secondary | ICD-10-CM

## 2022-08-24 DIAGNOSIS — E66813 Obesity, class 3: Secondary | ICD-10-CM

## 2022-08-24 DIAGNOSIS — E1169 Type 2 diabetes mellitus with other specified complication: Secondary | ICD-10-CM | POA: Diagnosis not present

## 2022-08-24 DIAGNOSIS — Z6841 Body Mass Index (BMI) 40.0 and over, adult: Secondary | ICD-10-CM

## 2022-08-24 DIAGNOSIS — K5790 Diverticulosis of intestine, part unspecified, without perforation or abscess without bleeding: Secondary | ICD-10-CM | POA: Diagnosis not present

## 2022-08-24 MED ORDER — LINACLOTIDE 145 MCG PO CAPS: 145.0000 ug | ORAL_CAPSULE | Freq: Every day | ORAL | 1 refills | Status: DC

## 2022-08-24 NOTE — Progress Notes (Signed)
I,Victoria T Deloria Lair, CMA,acting as a Neurosurgeon for Gwynneth Aliment, MD.,have documented all relevant documentation on the behalf of Gwynneth Aliment, MD,as directed by  Gwynneth Aliment, MD while in the presence of Gwynneth Aliment, MD.  Subjective:  Patient ID: Evelyn Wade , female    DOB: [birth year] , 52 y.o.   MRN: [id number]  Chief Complaint  Patient presents with   Diabetes   Hypertension    HPI  Patient presents today for a DM & BPC check. Patient reports compliance with all medications. She has been able to control her DM with diet and lifestyle changes.  She denies having any headaches, chest pain and shortness of breath.  She denies having any  specific questions or concerns.    Diabetes She presents for her follow-up diabetic visit. She has type 2 diabetes mellitus. Her disease course has been stable. There are no hypoglycemic associated symptoms. Pertinent negatives for diabetes include no blurred vision and no chest pain. There are no hypoglycemic complications. Symptoms are stable. Risk factors for coronary artery disease include diabetes mellitus, hypertension, obesity and sedentary lifestyle. Her weight is decreasing steadily. She is following a diabetic diet. Her breakfast blood glucose is taken between 7-8 am. Her breakfast blood glucose range is generally 90-110 mg/dl.  Hypertension This is a chronic problem. The current episode started more than 1 year ago. The problem is controlled. Pertinent negatives include no blurred vision or chest pain.     Past Medical History:  Diagnosis Date   Diabetes mellitus without complication (HCC)    Hypertension    Pneumonia    hx   Sleep apnea    cpap 4-5 yrs     Family History  Problem Relation Age of Onset   Ovarian cancer Mother    Heart attack Father    Diabetes Father    Hypertension Brother      Current Outpatient Medications:    acetaminophen (TYLENOL) 650 MG CR tablet, Take 1,300 mg by mouth every 8 (eight) hours  as needed for pain., Disp: , Rfl:    albuterol (VENTOLIN HFA) 108 (90 Base) MCG/ACT inhaler, Inhale 2 puffs every 4 to 8 hours as needed, Disp: 3 each, Rfl: 3   benzonatate (TESSALON PERLES) 100 MG capsule, Take 1 capsule (100 mg total) by mouth 3 (three) times daily as needed for cough., Disp: 30 capsule, Rfl: 1   docusate sodium (COLACE) 250 MG capsule, Take 1 capsule (250 mg total) by mouth daily., Disp: 10 capsule, Rfl: 0   Fe Fum-FePoly-Vit C-Vit B3 (INTEGRA) 62.5-62.5-40-3 MG CAPS, TAKE 1 CAPSULE BY MOUTH EVERY DAY, Disp: 90 capsule, Rfl: 1   glucose blood test strip, Use as instructed, Disp: 50 each, Rfl: 12   linaclotide (LINZESS) 145 MCG CAPS capsule, Take 1 capsule (145 mcg total) by mouth daily before breakfast., Disp: 90 capsule, Rfl: 1   olmesartan-hydrochlorothiazide (BENICAR HCT) 20-12.5 MG tablet, TAKE 1 TABLET BY MOUTH EVERY DAY, Disp: 90 tablet, Rfl: 2   Olopatadine HCl (PATADAY OP), Place 1 drop into both eyes daily as needed (allergies)., Disp: , Rfl:    ondansetron (ZOFRAN) 4 MG tablet, Take 1 tablet (4 mg total) by mouth every 8 (eight) hours as needed for nausea or vomiting., Disp: 20 tablet, Rfl: 0   traMADol (ULTRAM) 50 MG tablet, Take 1 tablet (50 mg total) by mouth 3 (three) times daily when necessary, Disp: 30 tablet, Rfl: 0   No Known Allergies   Review of Systems  Constitutional: Negative.   HENT: Negative.    Eyes:  Negative for blurred vision.  Respiratory: Negative.    Cardiovascular: Negative.  Negative for chest pain.  Gastrointestinal:  Positive for constipation.  Musculoskeletal: Negative.   Neurological: Negative.   Psychiatric/Behavioral: Negative.       Today's Vitals   08/24/22 1601  BP: 118/84  Pulse: 82  Temp: 98.5 F (36.9 C)  SpO2: 98%  Weight: (!) 321 lb (145.6 kg)  Height: 5' (1.524 m)   Body mass index is 62.69 kg/m.  Wt Readings from Last 3 Encounters:  08/24/22 (!) 321 lb (145.6 kg)  08/06/22 (!) 320 lb (145.2 kg)  06/16/22  (!) 330 lb (149.7 kg)     Objective:  Physical Exam Vitals and nursing note reviewed.  Constitutional:      Appearance: Normal appearance. She is obese.  HENT:     Head: Normocephalic and atraumatic.  Eyes:     Extraocular Movements: Extraocular movements intact.  Cardiovascular:     Rate and Rhythm: Normal rate and regular rhythm.     Heart sounds: Normal heart sounds.  Pulmonary:     Effort: Pulmonary effort is normal.     Breath sounds: Normal breath sounds.  Musculoskeletal:     Cervical back: Normal range of motion.  Skin:    General: Skin is warm.  Neurological:     General: No focal deficit present.     Mental Status: She is alert.  Psychiatric:        Mood and Affect: Mood normal.        Behavior: Behavior normal.         Assessment And Plan:  Type 2 diabetes mellitus with other specified complication, without long-term current use of insulin (HCC) Assessment & Plan: Chronic, she is interested in GLP-1 agonist therapy; however, we have not been successful in getting this approved since she does not have recent A1c greater than 6.5%. She has tolerated Ozempic in the past. She will rto in 3-4 months for re-evaluation.   Orders: -     Hemoglobin A1c; Future -     BMP8+EGFR; Future  Essential hypertension, benign Assessment & Plan: Chronic, controlled. She will c/w olmesartan/hct 20/12.5mg  daily. She is encouraged to follow low sodium diet. I will check renal function today.    Chronic constipation Assessment & Plan: Will resume Linzess 145mg  daily.  If abdominal discomfort persists - will refer her to GI for further evaluation.    Diverticular disease Assessment & Plan: Recent diagnosis, ER records also reviewed.  She was initially encouraged to avoid nuts, seeds, and popcorn. However, she is also aware recent studies show that these foods do not trigger diveritcular flares.   Class 3 severe obesity due to excess calories with serious comorbidity and body  mass index (BMI) of 60.0 to 69.9 in adult [facility]) Assessment & Plan: BMI 62.  Unfortunately, her insurance does not cover GLP-1 agonists. She is encouraged to incorporate at least 150 minutes of exercise/week, including strength training. She is also advised to avoid refined carbs in her diet.    Other orders -     linaCLOtide; Take 1 capsule (145 mcg total) by mouth daily before breakfast.  Dispense: 90 capsule; Refill: 1  She is encouraged to strive for BMI less than 30 to decrease cardiac risk. Advised to aim for at least 150 minutes of exercise per week.    Return in 6 weeks (on 10/05/2022), or Linzess f/u.  Patient was given  opportunity to ask questions. Patient verbalized understanding of the plan and was able to repeat key elements of the plan. All questions were answered to their satisfaction.   I, Gwynneth Aliment, MD, have reviewed all documentation for this visit. The documentation on 08/24/22 for the exam, diagnosis, procedures, and orders are all accurate and complete.   IF YOU HAVE BEEN REFERRED TO A SPECIALIST, IT MAY TAKE 1-2 WEEKS TO SCHEDULE/PROCESS THE REFERRAL. IF YOU HAVE NOT HEARD FROM US/SPECIALIST IN TWO WEEKS, PLEASE GIVE [place] A CALL AT [phone].   THE PATIENT IS ENCOURAGED TO PRACTICE SOCIAL DISTANCING DUE TO THE COVID-19 PANDEMIC.

## 2022-08-24 NOTE — Patient Instructions (Signed)

## 2022-09-05 DIAGNOSIS — K5909 Other constipation: Secondary | ICD-10-CM | POA: Insufficient documentation

## 2022-09-05 DIAGNOSIS — K5790 Diverticulosis of intestine, part unspecified, without perforation or abscess without bleeding: Secondary | ICD-10-CM | POA: Insufficient documentation

## 2022-09-05 NOTE — Assessment & Plan Note (Addendum)
Will resume Linzess 145mg  daily.  If abdominal discomfort persists - will refer her to GI for further evaluation.

## 2022-09-05 NOTE — Assessment & Plan Note (Signed)
Chronic, controlled. She will c/w olmesartan/hct 20/12.5mg  daily. She is encouraged to follow low sodium diet. I will check renal function today.

## 2022-09-05 NOTE — Assessment & Plan Note (Signed)
Chronic, she is interested in GLP-1 agonist therapy; however, we have not been successful in getting this approved since she does not have recent A1c greater than 6.5%. She has tolerated Ozempic in the past. She will rto in 3-4 months for re-evaluation.

## 2022-09-05 NOTE — Assessment & Plan Note (Addendum)
Recent diagnosis, ER records also reviewed.  She was initially encouraged to avoid nuts, seeds, and popcorn. However, she is also aware recent studies show that these foods do not trigger diveritcular flares.

## 2022-09-05 NOTE — Assessment & Plan Note (Signed)
BMI 62.  Unfortunately, her insurance does not cover GLP-1 agonists. She is encouraged to incorporate at least 150 minutes of exercise/week, including strength training. She is also advised to avoid refined carbs in her diet.

## 2022-10-07 NOTE — Patient Instructions (Signed)
 Chronic Constipation  Chronic constipation is when a person poops 3 times a week or less for 3 months or longer. It is most common in older adults.  What are the causes?  This condition may be caused by:  Not drinking enough fluid, eating enough fiber, or getting enough exercise.  Pregnancy.  An anal fissure. This is a tear in the opening of the butt (anus).  Bowel obstruction. This is when there is a blockage in your intestine (bowel).  Bowel stricture. This is when your bowel narrows.  A long-term (chronic) condition, such as:  Diabetes or hypothyroidism.  A stroke or injury to the spinal cord.  Multiple sclerosis or Parkinson's disease.  Colon cancer.  Dementia.  Inflammatory bowel disease (IBD), rectal prolapse, or hemorrhoids.  Taking certain medicines. These include:  Some pain medicines.  Antacids or iron supplements.  Water pills (diuretics).  Some blood pressure medicines.  Medicines to keep you from having seizures (anti-seizure medicines).  Antidepressants.  Medicines for Parkinson's disease.  Other causes may include:  Stress.  Problems with the nerves and muscles that help you poop.  Weak muscles in the area between your hip bones (pelvis).  What increases the risk?  You may be more at risk for this condition if:  You are older than 50 years of age.  You are female.  You live in a long-term care facility.  You have a chronic condition.  You have a mental health disorder or eating disorder.  What are the signs or symptoms?  The main sign of this condition is pooping 3 times a week or less. Other signs and symptoms may include:  Pushing hard (straining) to poop.  Hard or lumpy poop (stool).  Pain when you poop.  Discomfort in your lower abdomen. This may include cramps and bloating.  Not being able to poop when you need to.  Feeling like you still need to poop after you are done.  Feeling like there is something in your rectum that is blocking or stopping you from pooping.  Blood in your poop or seeing  blood on the toilet paper after you poop.  Confusion. This is most common in older adults.  How is this diagnosed?  This condition may be diagnosed based on your symptoms and medical history. You may also need:  An exam of your abdomen.  A digital rectal exam. For this exam, your health care provider will put a gloved finger into your rectum.  Testing. Tests may include:  Imaging studies, such as an X-ray, ultrasound, or CT scan.  Blood tests.  A colonoscopy. This is a test that looks at your colon.  A test of how well your anal sphincter works. This is a muscle shaped like a ring. It helps your anus open and close.  A test of how well food moves through your colon.  An electromyography. This is a test of the nerves in your pelvis.  How is this treated?  Treatment depends on the cause. You may need to:  Be more active.  Drink more fluids.  Add fiber to your diet. Sources of fiber include fruits, vegetables, and whole grains. You may also need to take a fiber supplement.  Stop or change medicines that can cause constipation.  Take medicines, such as:  Pro-motility agents. These help your muscles tighten (contract). This can help you push out poop.  Stool softeners. These soften your poop.  An osmotic laxative. A laxative is a medicine that helps you  poop. This type works by [employer] into your colon.  Train the muscles in your pelvis with biofeedback.  Have surgery. This may be done if something is blocking your bowels.  You may also need to see an expert in problems with the digestive system (gastroenterologist).  Follow these instructions at home:  Medicines  Take over-the-counter and prescription medicines only as told by your provider.  If you are taking a laxative, take it only as told by your provider.  Eating and drinking    Drink enough fluid to keep your pee (urine) pale yellow.  Eat foods that are high in fiber, such as beans, whole grains, and fresh fruits and vegetables.  Limit foods that are high  in fat and processed sugars, such as fried or sweet foods.  Stay away from alcohol, caffeine, and soda. These can make constipation worse.  General instructions  Get physical activity every day. Ask your provider what activities are safe for you.  Get colon cancer screenings as told by your provider.  Contact a health care provider if:  You poop 3 times a week or less.  Your poop is hard or lumpy.  There is blood on the toilet paper or in your poop.  You lose weight for no clear reason.  You have pain in your rectum.  Poop is leaking out of your rectum.  You have nausea or vomiting.  This information is not intended to replace advice given to you by your health care provider. Make sure you discuss any questions you have with your health care provider.  Document Revised: 11/10/2021 Document Reviewed: 11/10/2021  Elsevier Patient Education  2024 [employer].

## 2022-10-13 ENCOUNTER — Encounter: Payer: Self-pay | Admitting: Internal Medicine

## 2022-10-13 ENCOUNTER — Ambulatory Visit: Payer: BC Managed Care – PPO | Admitting: Internal Medicine

## 2022-10-13 VITALS — BP 126/84 | HR 93 | Temp 97.9°F | Ht 60.0 in | Wt 326.0 lb

## 2022-10-13 DIAGNOSIS — Z6841 Body Mass Index (BMI) 40.0 and over, adult: Secondary | ICD-10-CM

## 2022-10-13 DIAGNOSIS — I10 Essential (primary) hypertension: Secondary | ICD-10-CM | POA: Diagnosis not present

## 2022-10-13 DIAGNOSIS — E1169 Type 2 diabetes mellitus with other specified complication: Secondary | ICD-10-CM | POA: Diagnosis not present

## 2022-10-13 DIAGNOSIS — K5909 Other constipation: Secondary | ICD-10-CM

## 2022-10-13 DIAGNOSIS — K5790 Diverticulosis of intestine, part unspecified, without perforation or abscess without bleeding: Secondary | ICD-10-CM

## 2022-10-13 NOTE — Progress Notes (Signed)
I,Victoria T Deloria Lair, CMA,acting as a Neurosurgeon for Gwynneth Aliment, MD.,have documented all relevant documentation on the behalf of Gwynneth Aliment, MD,as directed by  Gwynneth Aliment, MD while in the presence of Gwynneth Aliment, MD.  Subjective:  Patient ID: Evelyn Wade , female    DOB: 12-09-1970 , 52 y.o.   MRN: [id number]  Chief Complaint  Patient presents with   Med Check    HPI  Patient presents today for Linzess follow up. She reports compliance with medication.  She reports noticing the medication worked well for the first 3 weeks. However, she now feels it is less effective.  She denies recent change in her diet.     Diabetes She presents for her follow-up diabetic visit. She has type 2 diabetes mellitus. Her disease course has been stable. There are no hypoglycemic associated symptoms. Pertinent negatives for diabetes include no blurred vision and no chest pain. There are no hypoglycemic complications. Symptoms are stable. Risk factors for coronary artery disease include diabetes mellitus, hypertension, obesity and sedentary lifestyle. Her weight is decreasing steadily. She is following a diabetic diet. Her breakfast blood glucose is taken between 7-8 am. Her breakfast blood glucose range is generally 90-110 mg/dl.  Hypertension This is a chronic problem. The current episode started more than 1 year ago. The problem is controlled. Pertinent negatives include no blurred vision or chest pain. Risk factors for coronary artery disease include diabetes mellitus, obesity and sedentary lifestyle. Past treatments include diuretics and angiotensin blockers.     Past Medical History:  Diagnosis Date   Diabetes mellitus without complication (HCC)    Hypertension    Pneumonia    hx   Sleep apnea    cpap 4-5 yrs     Family History  Problem Relation Age of Onset   Ovarian cancer Mother    Heart attack Father    Diabetes Father    Hypertension Brother      Current Outpatient  Medications:    acetaminophen (TYLENOL) 650 MG CR tablet, Take 1,300 mg by mouth every 8 (eight) hours as needed for pain., Disp: , Rfl:    albuterol (VENTOLIN HFA) 108 (90 Base) MCG/ACT inhaler, Inhale 2 puffs every 4 to 8 hours as needed, Disp: 3 each, Rfl: 3   benzonatate (TESSALON PERLES) 100 MG capsule, Take 1 capsule (100 mg total) by mouth 3 (three) times daily as needed for cough., Disp: 30 capsule, Rfl: 1   docusate sodium (COLACE) 250 MG capsule, Take 1 capsule (250 mg total) by mouth daily., Disp: 10 capsule, Rfl: 0   Fe Fum-FePoly-Vit C-Vit B3 (INTEGRA) 62.5-62.5-40-3 MG CAPS, TAKE 1 CAPSULE BY MOUTH EVERY DAY, Disp: 90 capsule, Rfl: 1   glucose blood test strip, Use as instructed, Disp: 50 each, Rfl: 12   linaclotide (LINZESS) 145 MCG CAPS capsule, Take 1 capsule (145 mcg total) by mouth daily before breakfast., Disp: 90 capsule, Rfl: 1   olmesartan-hydrochlorothiazide (BENICAR HCT) 20-12.5 MG tablet, TAKE 1 TABLET BY MOUTH EVERY DAY, Disp: 90 tablet, Rfl: 2   Olopatadine HCl (PATADAY OP), Place 1 drop into both eyes daily as needed (allergies)., Disp: , Rfl:    ondansetron (ZOFRAN) 4 MG tablet, Take 1 tablet (4 mg total) by mouth every 8 (eight) hours as needed for nausea or vomiting., Disp: 20 tablet, Rfl: 0   traMADol (ULTRAM) 50 MG tablet, Take 1 tablet (50 mg total) by mouth 3 (three) times daily when necessary, Disp: 30 tablet, Rfl: 0  tirzepatide [facility]) 2.5 MG/0.5ML Pen, Inject 2.5 mg into the skin once a week., Disp: 2 mL, Rfl: 0   tirzepatide (MOUNJARO) 5 MG/0.5ML Pen, Inject 5 mg into the skin once a week., Disp: 2 mL, Rfl: 1   No Known Allergies   Review of Systems  Constitutional: Negative.   Eyes:  Negative for blurred vision.  Respiratory: Negative.    Cardiovascular: Negative.  Negative for chest pain.  Gastrointestinal:  Positive for constipation.  Neurological: Negative.   Psychiatric/Behavioral: Negative.       Today's Vitals   10/13/22 1459  BP:  126/84  Pulse: 93  Temp: 97.9 F (36.6 C)  SpO2: 98%  Weight: (!) 326 lb (147.9 kg)  Height: 5' (1.524 m)   Body mass index is 63.67 kg/m.  Wt Readings from Last 3 Encounters:  10/13/22 (!) 326 lb (147.9 kg)  08/24/22 (!) 321 lb (145.6 kg)  08/06/22 (!) 320 lb (145.2 kg)     Objective:  Physical Exam Vitals and nursing note reviewed.  Constitutional:      Appearance: Normal appearance. She is obese.  HENT:     Head: Normocephalic and atraumatic.  Eyes:     Extraocular Movements: Extraocular movements intact.  Cardiovascular:     Rate and Rhythm: Normal rate and regular rhythm.     Heart sounds: Normal heart sounds.  Pulmonary:     Effort: Pulmonary effort is normal.     Breath sounds: Normal breath sounds.  Abdominal:     General: Bowel sounds are normal.     Palpations: Abdomen is soft.  Musculoskeletal:     Cervical back: Normal range of motion.  Skin:    General: Skin is warm.  Neurological:     General: No focal deficit present.     Mental Status: She is alert.  Psychiatric:        Mood and Affect: Mood normal.        Behavior: Behavior normal.         Assessment And Plan:  Chronic constipation Assessment & Plan: Chronic, unfortunately Linzess 145mg  is not effective. I will increase her dose to 290mg  daily. She will change to every other day dosing if she develops diarrhea.   Type 2 diabetes mellitus with other specified complication, without long-term current use of insulin (HCC) Assessment & Plan: Chronic, I iwll check glucose level and A1c today.  She is encouraged to incorporate more exercise into her daily routine.  She is currently off meds, will make further recommendations once her labs are available for review.   Orders: -     Hemoglobin A1c -     BMP8+EGFR  Essential hypertension, benign Assessment & Plan: Chronic, fair control. She will continue with olmesartan/hct 2012.5mg  daily for now. Goal BP is less than 120/80.  She is encouraged to  follow low sodium diet.   Class 3 severe obesity due to excess calories with serious comorbidity and body mass index (BMI) of 60.0 to 69.9 in adult [facility]) Assessment & Plan: BMI 63.  She is encouraged to incorporate strength training into her daily routine.    She is encouraged to strive for BMI less than 30 to decrease cardiac risk. Advised to aim for at least 150 minutes of exercise per week.    Return if symptoms worsen or fail to improve.  Patient was given opportunity to ask questions. Patient verbalized understanding of the plan and was able to repeat key elements of the plan. All questions were answered  to their satisfaction.   I, Gwynneth Aliment, MD, have reviewed all documentation for this visit. The documentation on 10/13/22 for the exam, diagnosis, procedures, and orders are all accurate and complete.   IF YOU HAVE BEEN REFERRED TO A SPECIALIST, IT MAY TAKE 1-2 WEEKS TO SCHEDULE/PROCESS THE REFERRAL. IF YOU HAVE NOT HEARD FROM US/SPECIALIST IN TWO WEEKS, PLEASE GIVE [place] A CALL AT [phone].   THE PATIENT IS ENCOURAGED TO PRACTICE SOCIAL DISTANCING DUE TO THE COVID-19 PANDEMIC.

## 2022-10-14 ENCOUNTER — Other Ambulatory Visit: Payer: Self-pay | Admitting: Internal Medicine

## 2022-10-14 LAB — BMP8+EGFR
BUN/Creatinine Ratio: 18 (ref 9–23)
BUN: 13 mg/dL (ref 6–24)
CO2: 21 mmol/L (ref 20–29)
Calcium: 10.2 mg/dL (ref 8.7–10.2)
Chloride: 106 mmol/L (ref 96–106)
Creatinine, Ser: 0.71 mg/dL (ref 0.57–1.00)
Glucose: 171 mg/dL — ABNORMAL HIGH (ref 70–99)
Potassium: 4.2 mmol/L (ref 3.5–5.2)
Sodium: 143 mmol/L (ref 134–144)
eGFR: 104 mL/min/{1.73_m2} (ref >59)

## 2022-10-14 LAB — HEMOGLOBIN A1C
Est. average glucose Bld gHb Est-mCnc: 126 mg/dL
Hgb A1c MFr Bld: 6 % — ABNORMAL HIGH (ref 4.8–5.6)

## 2022-10-14 MED ORDER — TIRZEPATIDE 2.5 MG/0.5ML ~~LOC~~ SOAJ: 2.5000 mg | SUBCUTANEOUS | 0 refills | Status: DC

## 2022-10-15 ENCOUNTER — Other Ambulatory Visit: Payer: Self-pay

## 2022-10-15 ENCOUNTER — Encounter: Payer: Self-pay | Admitting: Internal Medicine

## 2022-10-16 ENCOUNTER — Other Ambulatory Visit: Payer: Self-pay

## 2022-10-16 DIAGNOSIS — E1169 Type 2 diabetes mellitus with other specified complication: Secondary | ICD-10-CM

## 2022-10-16 MED ORDER — MOUNJARO 5 MG/0.5ML ~~LOC~~ SOAJ
5.0000 mg | SUBCUTANEOUS | 1 refills | Status: DC
Start: 2022-10-16 — End: 2023-03-17

## 2022-10-16 NOTE — Progress Notes (Signed)
I

## 2022-10-23 NOTE — Assessment & Plan Note (Signed)
Chronic, fair control. She will continue with olmesartan/hct 2012.5mg  daily for now. Goal BP is less than 120/80.  She is encouraged to follow low sodium diet.

## 2022-10-23 NOTE — Assessment & Plan Note (Signed)
Chronic, unfortunately Linzess 145mg  is not effective. I will increase her dose to 290mg  daily. She will change to every other day dosing if she develops diarrhea.

## 2022-10-23 NOTE — Assessment & Plan Note (Signed)
BMI 63.  She is encouraged to incorporate strength training into her daily routine.

## 2022-10-23 NOTE — Assessment & Plan Note (Signed)
Chronic, I iwll check glucose level and A1c today.  She is encouraged to incorporate more exercise into her daily routine.  She is currently off meds, will make further recommendations once her labs are available for review.

## 2022-10-26 ENCOUNTER — Telehealth: Payer: Self-pay

## 2022-10-26 NOTE — Telephone Encounter (Signed)
Rx benefits verified YL,RMA

## 2022-10-27 ENCOUNTER — Other Ambulatory Visit: Payer: Self-pay | Admitting: Internal Medicine

## 2022-11-01 ENCOUNTER — Other Ambulatory Visit: Payer: Self-pay | Admitting: Internal Medicine

## 2023-01-26 ENCOUNTER — Encounter: Payer: BC Managed Care – PPO | Admitting: Internal Medicine

## 2023-02-02 ENCOUNTER — Encounter: Payer: Self-pay | Admitting: Internal Medicine

## 2023-02-02 ENCOUNTER — Ambulatory Visit: Payer: BC Managed Care – PPO | Admitting: Internal Medicine

## 2023-02-02 VITALS — BP 130/78 | HR 85 | Temp 98.3°F | Ht 60.0 in | Wt 323.4 lb

## 2023-02-02 DIAGNOSIS — L601 Onycholysis: Secondary | ICD-10-CM | POA: Diagnosis not present

## 2023-02-02 DIAGNOSIS — E1169 Type 2 diabetes mellitus with other specified complication: Secondary | ICD-10-CM | POA: Diagnosis not present

## 2023-02-02 DIAGNOSIS — I10 Essential (primary) hypertension: Secondary | ICD-10-CM

## 2023-02-02 DIAGNOSIS — Z6841 Body Mass Index (BMI) 40.0 and over, adult: Secondary | ICD-10-CM

## 2023-02-02 DIAGNOSIS — Z2821 Immunization not carried out because of patient refusal: Secondary | ICD-10-CM

## 2023-02-02 DIAGNOSIS — R0982 Postnasal drip: Secondary | ICD-10-CM

## 2023-02-02 DIAGNOSIS — E66813 Obesity, class 3: Secondary | ICD-10-CM

## 2023-02-02 DIAGNOSIS — Z0000 Encounter for general adult medical examination without abnormal findings: Secondary | ICD-10-CM | POA: Diagnosis not present

## 2023-02-02 LAB — POCT URINALYSIS DIP (CLINITEK)
Bilirubin, UA: NEGATIVE
Blood, UA: NEGATIVE
Glucose, UA: NEGATIVE mg/dL
Ketones, POC UA: NEGATIVE mg/dL
Leukocytes, UA: NEGATIVE
Nitrite, UA: NEGATIVE
POC PROTEIN,UA: NEGATIVE
Spec Grav, UA: 1.025 (ref 1.010–1.025)
Urobilinogen, UA: 1 U/dL
pH, UA: 6 (ref 5.0–8.0)

## 2023-02-02 MED ORDER — CHLORPHEN-PE-ACETAMINOPHEN 4-10-325 MG PO TABS: ORAL_TABLET | ORAL | 0 refills | Status: DC

## 2023-02-02 NOTE — Progress Notes (Unsigned)
Madelaine Bhat, CMA,acting as a scribe for Gwynneth Aliment, MD.,have documented all relevant documentation on the behalf of Gwynneth Aliment, MD,as directed by  Gwynneth Aliment, MD while in the presence of Gwynneth Aliment, MD.  Subjective:    Patient ID: Evelyn Wade , female    DOB: 05-24-1970 , 52 y.o.   MRN: [id number]  Chief Complaint  Patient presents with  . Annual Exam    HPI  Patient presents today for HM, Patient reports compliance with medication. Patient denies any chest pain, SOB, or headaches. Patient has no concerns today.   EKG unable to obtain due to system being down.   Diabetes She presents for her follow-up diabetic visit. She has type 2 diabetes mellitus. Her disease course has been stable. There are no hypoglycemic associated symptoms. Pertinent negatives for diabetes include no blurred vision and no chest pain. There are no hypoglycemic complications. Symptoms are stable. Risk factors for coronary artery disease include diabetes mellitus, hypertension, obesity and sedentary lifestyle. Her weight is decreasing steadily. She is following a diabetic diet. Her breakfast blood glucose is taken between 7-8 am. Her breakfast blood glucose range is generally 90-110 mg/dl.  Hypertension This is a chronic problem. The current episode started more than 1 year ago. The problem is controlled. Pertinent negatives include no blurred vision or chest pain.     Past Medical History:  Diagnosis Date  . Diabetes mellitus without complication (HCC)   . Hypertension   . Pneumonia    hx  . Sleep apnea    cpap 4-5 yrs     Family History  Problem Relation Age of Onset  . Ovarian cancer Mother   . Heart attack Father   . Diabetes Father   . Hypertension Brother      Current Outpatient Medications:  .  acetaminophen (TYLENOL) 650 MG CR tablet, Take 1,300 mg by mouth every 8 (eight) hours as needed for pain., Disp: , Rfl:  .  albuterol (VENTOLIN HFA) 108 (90 Base) MCG/ACT  inhaler, Inhale 2 puffs every 4 to 8 hours as needed, Disp: 3 each, Rfl: 3 .  docusate sodium (COLACE) 250 MG capsule, Take 1 capsule (250 mg total) by mouth daily., Disp: 10 capsule, Rfl: 0 .  Fe Fum-FePoly-Vit C-Vit B3 (INTEGRA) 62.5-62.5-40-3 MG CAPS, TAKE 1 CAPSULE BY MOUTH EVERY DAY, Disp: 90 capsule, Rfl: 1 .  glucose blood test strip, Use as instructed, Disp: 50 each, Rfl: 12 .  linaclotide (LINZESS) 145 MCG CAPS capsule, Take 1 capsule (145 mcg total) by mouth daily before breakfast., Disp: 90 capsule, Rfl: 1 .  olmesartan-hydrochlorothiazide (BENICAR HCT) 20-12.5 MG tablet, TAKE 1 TABLET BY MOUTH EVERY DAY, Disp: 90 tablet, Rfl: 2 .  Olopatadine HCl (PATADAY OP), Place 1 drop into both eyes daily as needed (allergies)., Disp: , Rfl:  .  ondansetron (ZOFRAN) 4 MG tablet, Take 1 tablet (4 mg total) by mouth every 8 (eight) hours as needed for nausea or vomiting., Disp: 20 tablet, Rfl: 0 .  tirzepatide (MOUNJARO) 2.5 MG/0.5ML Pen, Inject 2.5 mg into the skin once a week., Disp: 2 mL, Rfl: 0 .  tirzepatide (MOUNJARO) 5 MG/0.5ML Pen, Inject 5 mg into the skin once a week., Disp: 2 mL, Rfl: 1 .  traMADol (ULTRAM) 50 MG tablet, Take 1 tablet (50 mg total) by mouth 3 (three) times daily when necessary, Disp: 30 tablet, Rfl: 0   No Known Allergies    The patient states she uses {contraceptive  methods:5051} for birth control. No LMP recorded.. {Dysmenorrhea-menorrhagia:21918}. Negative for: breast discharge, breast lump(s), breast pain and breast self exam. Associated symptoms include abnormal vaginal bleeding. Pertinent negatives include abnormal bleeding (hematology), anxiety, decreased libido, depression, difficulty falling sleep, dyspareunia, history of infertility, nocturia, sexual dysfunction, sleep disturbances, urinary incontinence, urinary urgency, vaginal discharge and vaginal itching. Diet regular.The patient states her exercise level is    . The patient's tobacco use is:  Social History    Tobacco Use  Smoking Status Never  Smokeless Tobacco Never  . She has been exposed to passive smoke. The patient's alcohol use is:  Social History   Substance and Sexual Activity  Alcohol Use No  . Additional information: Last pap ***, next one scheduled for ***.    Review of Systems  Eyes:  Negative for blurred vision.  Cardiovascular:  Negative for chest pain.     Today's Vitals   02/02/23 1057  BP: 130/78  Pulse: 85  Temp: 98.3 F (36.8 C)  TempSrc: Oral  Weight: (!) 323 lb 6.4 oz (146.7 kg)  Height: 5' (1.524 m)  PainSc: 0-No pain   Body mass index is 63.16 kg/m.  Wt Readings from Last 3 Encounters:  02/02/23 (!) 323 lb 6.4 oz (146.7 kg)  10/13/22 (!) 326 lb (147.9 kg)  08/24/22 (!) 321 lb (145.6 kg)     Objective:  Physical Exam Vitals and nursing note reviewed.  Constitutional:      Appearance: Normal appearance. She is obese.  HENT:     Head: Normocephalic and atraumatic.     Right Ear: Tympanic membrane, ear canal and external ear normal.     Left Ear: Tympanic membrane, ear canal and external ear normal.     Nose: Nose normal.     Mouth/Throat:     Mouth: Mucous membranes are moist.     Pharynx: Oropharynx is clear.  Eyes:     Extraocular Movements: Extraocular movements intact.     Conjunctiva/sclera: Conjunctivae normal.     Pupils: Pupils are equal, round, and reactive to light.  Cardiovascular:     Rate and Rhythm: Normal rate and regular rhythm.     Pulses: Normal pulses.     Heart sounds: Normal heart sounds.  Pulmonary:     Effort: Pulmonary effort is normal.     Breath sounds: Normal breath sounds.  Abdominal:     General: Bowel sounds are normal.     Palpations: Abdomen is soft.     Comments: Obese, difficult to assess organomegaly  Genitourinary:    Comments: deferred Musculoskeletal:        General: Normal range of motion.     Cervical back: Normal range of motion and neck supple.  Skin:    General: Skin is warm and dry.   Neurological:     General: No focal deficit present.     Mental Status: She is alert and oriented to person, place, and time.  Psychiatric:        Mood and Affect: Mood normal.        Behavior: Behavior normal.        Assessment And Plan:     Encounter for annual health examination  Type 2 diabetes mellitus with other specified complication, without long-term current use of insulin (HCC) -     POCT URINALYSIS DIP (CLINITEK) -     Microalbumin / creatinine urine ratio  Essential hypertension, benign  Herpes zoster vaccination declined  COVID-19 vaccination declined  Class 3 severe  obesity due to excess calories with serious comorbidity and body mass index (BMI) of 60.0 to 69.9 in adult [facility])     Return for 1 year physical, controlled DM check 4 months. Patient was given opportunity to ask questions. Patient verbalized understanding of the plan and was able to repeat key elements of the plan. All questions were answered to their satisfaction.    I, Gwynneth Aliment, MD, have reviewed all documentation for this visit. The documentation on 02/02/23 for the exam, diagnosis, procedures, and orders are all accurate and complete.

## 2023-02-03 LAB — CBC WITH DIFFERENTIAL/PLATELET
Basophils Absolute: 0.1 10*3/uL (ref 0.0–0.2)
Basos: 1 %
EOS (ABSOLUTE): 0.2 10*3/uL (ref 0.0–0.4)
Eos: 2 %
Hematocrit: 34.9 % (ref 34.0–46.6)
Hemoglobin: 10.6 g/dL — ABNORMAL LOW (ref 11.1–15.9)
Immature Grans (Abs): 0.1 10*3/uL (ref 0.0–0.1)
Immature Granulocytes: 1 %
Lymphocytes Absolute: 1.5 10*3/uL (ref 0.7–3.1)
Lymphs: 16 %
MCH: 23.1 pg — ABNORMAL LOW (ref 26.6–33.0)
MCHC: 30.4 g/dL — ABNORMAL LOW (ref 31.5–35.7)
MCV: 76 fL — ABNORMAL LOW (ref 79–97)
Monocytes Absolute: 0.5 10*3/uL (ref 0.1–0.9)
Monocytes: 5 %
Neutrophils Absolute: 7 10*3/uL (ref 1.4–7.0)
Neutrophils: 75 %
Platelets: 490 10*3/uL — ABNORMAL HIGH (ref 150–450)
RBC: 4.58 x10E6/uL (ref 3.77–5.28)
RDW: 15.3 % (ref 11.7–15.4)
WBC: 9.4 10*3/uL (ref 3.4–10.8)

## 2023-02-03 LAB — CMP14+EGFR
ALT: 14 [IU]/L (ref 0–32)
AST: 16 [IU]/L (ref 0–40)
Albumin: 4.1 g/dL (ref 3.9–4.9)
Alkaline Phosphatase: 88 [IU]/L (ref 44–121)
BUN/Creatinine Ratio: 22 (ref 9–23)
BUN: 17 mg/dL (ref 6–24)
Bilirubin Total: 0.3 mg/dL (ref 0.0–1.2)
CO2: 25 mmol/L (ref 20–29)
Calcium: 10 mg/dL (ref 8.7–10.2)
Chloride: 102 mmol/L (ref 96–106)
Creatinine, Ser: 0.78 mg/dL (ref 0.57–1.00)
Globulin, Total: 2.9 g/dL (ref 1.5–4.5)
Glucose: 104 mg/dL — ABNORMAL HIGH (ref 70–99)
Potassium: 4.8 mmol/L (ref 3.5–5.2)
Sodium: 139 mmol/L (ref 134–144)
Total Protein: 7 g/dL (ref 6.0–8.5)
eGFR: 92 mL/min/{1.73_m2} (ref >59)

## 2023-02-03 LAB — LIPID PANEL
Chol/HDL Ratio: 2.1 {ratio} (ref 0.0–4.4)
Cholesterol, Total: 165 mg/dL (ref 100–199)
HDL: 80 mg/dL (ref >39)
LDL Chol Calc (NIH): 73 mg/dL (ref 0–99)
Triglycerides: 63 mg/dL (ref 0–149)
VLDL Cholesterol Cal: 12 mg/dL (ref 5–40)

## 2023-02-03 LAB — HEMOGLOBIN A1C
Est. average glucose Bld gHb Est-mCnc: 123 mg/dL
Hgb A1c MFr Bld: 5.9 % — ABNORMAL HIGH (ref 4.8–5.6)

## 2023-02-03 LAB — TSH: TSH: 0.7 u[IU]/mL (ref 0.450–4.500)

## 2023-02-04 LAB — MICROALBUMIN / CREATININE URINE RATIO
Creatinine, Urine: 143.1 mg/dL
Microalb/Creat Ratio: 13 mg/g{creat} (ref 0–29)
Microalbumin, Urine: 18.1 ug/mL

## 2023-02-08 LAB — SPECIMEN STATUS REPORT

## 2023-02-08 LAB — IRON AND TIBC
Iron Saturation: 16 % (ref 15–55)
Iron: 49 ug/dL (ref 27–159)
Total Iron Binding Capacity: 306 ug/dL (ref 250–450)
UIBC: 257 ug/dL (ref 131–425)

## 2023-02-08 LAB — FERRITIN: Ferritin: 26 ng/mL (ref 15–150)

## 2023-02-13 ENCOUNTER — Encounter: Payer: Self-pay | Admitting: Internal Medicine

## 2023-02-13 DIAGNOSIS — L601 Onycholysis: Secondary | ICD-10-CM | POA: Insufficient documentation

## 2023-02-13 DIAGNOSIS — Z0000 Encounter for general adult medical examination without abnormal findings: Secondary | ICD-10-CM | POA: Insufficient documentation

## 2023-02-13 DIAGNOSIS — R0982 Postnasal drip: Secondary | ICD-10-CM | POA: Insufficient documentation

## 2023-02-13 NOTE — Assessment & Plan Note (Signed)
 Chronic, fair control. She will continue with olmesartan/hct 2012.5mg  daily for now. Goal BP is less than 120/80.  She is encouraged to follow low sodium diet.  She will f/u in 3-4 months for re-evaluation.

## 2023-02-13 NOTE — Assessment & Plan Note (Addendum)
 Chronic, diabetic foot exam was performed  I iwll check glucose level and A1c today.  She is encouraged to incorporate more exercise into her daily routine.  She is currently off meds, will make further recommendations once her labs are available for review. I DISCUSSED WITH THE PATIENT AT LENGTH REGARDING THE GOALS OF GLYCEMIC CONTROL AND POSSIBLE LONG-TERM COMPLICATIONS.  I  ALSO STRESSED THE IMPORTANCE OF COMPLIANCE WITH HOME GLUCOSE MONITORING, DIETARY RESTRICTIONS INCLUDING AVOIDANCE OF SUGARY DRINKS/PROCESSED FOODS,  ALONG WITH REGULAR EXERCISE.  I  ALSO STRESSED THE IMPORTANCE OF ANNUAL EYE EXAMS, SELF FOOT CARE AND COMPLIANCE WITH OFFICE VISITS.

## 2023-02-13 NOTE — Assessment & Plan Note (Signed)

## 2023-02-13 NOTE — Assessment & Plan Note (Signed)
 BMI 63.  She is encouraged to strive to lose ten percent of her body weight to decrease cardiac risk. Advised to aim for at least 150 minutes of exercise per week.

## 2023-02-13 NOTE — Assessment & Plan Note (Signed)
 I will send rx Norel AD to use twice daily as needed.  She will let me know if her sx persist/worsen.

## 2023-02-13 NOTE — Assessment & Plan Note (Signed)
 Due to trauma several years ago. Pt advised nail should eventually grow out.  Will refer her to Podiatry if she decides to have this removed.

## 2023-02-15 ENCOUNTER — Other Ambulatory Visit: Payer: Self-pay

## 2023-02-15 MED ORDER — INTEGRA 62.5-62.5-40-3 MG PO CAPS: 1.0000 | ORAL_CAPSULE | Freq: Every day | ORAL | 1 refills | Status: DC

## 2023-03-17 ENCOUNTER — Ambulatory Visit: Payer: 59 | Admitting: Internal Medicine

## 2023-03-17 ENCOUNTER — Encounter: Payer: Self-pay | Admitting: Internal Medicine

## 2023-03-17 VITALS — BP 118/80 | HR 84 | Temp 98.4°F | Ht 60.0 in | Wt 333.6 lb

## 2023-03-17 DIAGNOSIS — E669 Obesity, unspecified: Secondary | ICD-10-CM | POA: Diagnosis not present

## 2023-03-17 DIAGNOSIS — J069 Acute upper respiratory infection, unspecified: Secondary | ICD-10-CM | POA: Diagnosis not present

## 2023-03-17 DIAGNOSIS — E1169 Type 2 diabetes mellitus with other specified complication: Secondary | ICD-10-CM

## 2023-03-17 MED ORDER — MOUNJARO 7.5 MG/0.5ML ~~LOC~~ SOAJ: 7.5000 mg | SUBCUTANEOUS | 1 refills | Status: DC

## 2023-03-17 MED ORDER — AMOXICILLIN 500 MG PO CAPS: 500.0000 mg | ORAL_CAPSULE | Freq: Three times a day (TID) | ORAL | 0 refills | Status: DC

## 2023-03-17 MED ORDER — HYDROCODONE BIT-HOMATROP MBR 5-1.5 MG/5ML PO SOLN: 5.0000 mL | Freq: Four times a day (QID) | ORAL | 0 refills | Status: DC | PRN

## 2023-03-17 NOTE — Progress Notes (Signed)
I,Victoria T Deloria Lair, CMA,acting as a Neurosurgeon for Gwynneth Aliment, MD.,have documented all relevant documentation on the behalf of Gwynneth Aliment, MD,as directed by  Gwynneth Aliment, MD while in the presence of Gwynneth Aliment, MD.  Subjective:  Patient ID: Evelyn Wade , female    DOB: Mar 25, 1970 , 53 y.o.   MRN: [id number]  No chief complaint on file.   HPI  Patient presents today for further evaluation of nasal congestion, right ear pain, along with a dry cough.  She denies having a fever. She states her son was recently treated for URI w/ b/l ear infection.   At home she has taken Norel, which does help. She states the symptoms still seem as if they're getting worse.   DM eye exam scheduled for later this month.       Past Medical History:  Diagnosis Date   Diabetes mellitus without complication (HCC)    Hypertension    Pneumonia    hx   Sleep apnea    cpap 4-5 yrs     Family History  Problem Relation Age of Onset   Ovarian cancer Mother    Heart attack Father    Diabetes Father    Hypertension Brother      Current Outpatient Medications:    acetaminophen (TYLENOL) 650 MG CR tablet, Take 1,300 mg by mouth every 8 (eight) hours as needed for pain., Disp: , Rfl:    albuterol (VENTOLIN HFA) 108 (90 Base) MCG/ACT inhaler, Inhale 2 puffs every 4 to 8 hours as needed, Disp: 3 each, Rfl: 3   amoxicillin (AMOXIL) 500 MG capsule, Take 1 capsule (500 mg total) by mouth 3 (three) times daily., Disp: 21 capsule, Rfl: 0   Chlorphen-PE-Acetaminophen 4-10-325 MG TABS, One tab po twice daily prn, Disp: 20 tablet, Rfl: 0   docusate sodium (COLACE) 250 MG capsule, Take 1 capsule (250 mg total) by mouth daily., Disp: 10 capsule, Rfl: 0   Fe Fum-FePoly-Vit C-Vit B3 (INTEGRA) 62.5-62.5-40-3 MG CAPS, Take 1 tablet by mouth daily., Disp: 90 capsule, Rfl: 1   glucose blood test strip, Use as instructed, Disp: 50 each, Rfl: 12   HYDROcodone bit-homatropine (HYDROMET) 5-1.5 MG/5ML syrup,  Take 5 mLs by mouth every 6 (six) hours as needed., Disp: 120 mL, Rfl: 0   linaclotide (LINZESS) 145 MCG CAPS capsule, Take 1 capsule (145 mcg total) by mouth daily before breakfast., Disp: 90 capsule, Rfl: 1   olmesartan-hydrochlorothiazide (BENICAR HCT) 20-12.5 MG tablet, TAKE 1 TABLET BY MOUTH EVERY DAY, Disp: 90 tablet, Rfl: 2   Olopatadine HCl (PATADAY OP), Place 1 drop into both eyes daily as needed (allergies)., Disp: , Rfl:    ondansetron (ZOFRAN) 4 MG tablet, Take 1 tablet (4 mg total) by mouth every 8 (eight) hours as needed for nausea or vomiting., Disp: 20 tablet, Rfl: 0   tirzepatide (MOUNJARO) 7.5 MG/0.5ML Pen, Inject 7.5 mg into the skin once a week., Disp: 2 mL, Rfl: 1   traMADol (ULTRAM) 50 MG tablet, Take 1 tablet (50 mg total) by mouth 3 (three) times daily when necessary, Disp: 30 tablet, Rfl: 0   No Known Allergies   Review of Systems  Constitutional: Negative.   HENT:  Positive for congestion and ear pain.   Respiratory: Negative.    Cardiovascular: Negative.   Neurological: Negative.   Psychiatric/Behavioral: Negative.       Today's Vitals   03/17/23 1409  BP: 118/80  Pulse: 84  Temp: 98.4 F (36.9  C)  SpO2: 98%  Weight: (!) 333 lb 9.6 oz (151.3 kg)  Height: 5' (1.524 m)   Body mass index is 65.15 kg/m.  Wt Readings from Last 3 Encounters:  03/17/23 (!) 333 lb 9.6 oz (151.3 kg)  02/02/23 (!) 323 lb 6.4 oz (146.7 kg)  10/13/22 (!) 326 lb (147.9 kg)     Objective:  Physical Exam Vitals and nursing note reviewed.  Constitutional:      Appearance: She is ill-appearing.  HENT:     Head: Normocephalic and atraumatic.     Right Ear: Ear canal and external ear normal. There is no impacted cerumen.     Left Ear: Tympanic membrane, ear canal and external ear normal. There is no impacted cerumen.  Cardiovascular:     Rate and Rhythm: Normal rate and regular rhythm.     Heart sounds: Normal heart sounds.  Pulmonary:     Effort: Pulmonary effort is normal.      Breath sounds: Normal breath sounds.  Skin:    General: Skin is warm.  Neurological:     General: No focal deficit present.     Mental Status: She is alert.  Psychiatric:        Mood and Affect: Mood normal.        Behavior: Behavior normal.         Assessment And Plan:  Upper respiratory tract infection, unspecified type Assessment & Plan: I will send rx amoxicillin 500mg  tid and hydromet syrup to use prn. She is encouraged to take full course of antibiotics.     Type 2 diabetes mellitus with obesity (HCC) Assessment & Plan: I will increase dose of Mounjaro to 7.5mg  weekly as requested. She is reminded to stop eating when full.    Other orders -     HYDROcodone Bit-Homatrop MBr; Take 5 mLs by mouth every 6 (six) hours as needed.  Dispense: 120 mL; Refill: 0 -     Amoxicillin; Take 1 capsule (500 mg total) by mouth 3 (three) times daily.  Dispense: 21 capsule; Refill: 0 -     Mounjaro; Inject 7.5 mg into the skin once a week.  Dispense: 2 mL; Refill: 1  She is encouraged to strive for BMI less than 30 to decrease cardiac risk. Advised to aim for at least 150 minutes of exercise per week.    Return if symptoms worsen or fail to improve.  Patient was given opportunity to ask questions. Patient verbalized understanding of the plan and was able to repeat key elements of the plan. All questions were answered to their satisfaction.    I, Gwynneth Aliment, MD, have reviewed all documentation for this visit. The documentation on 03/17/23 for the exam, diagnosis, procedures, and orders are all accurate and complete.   IF YOU HAVE BEEN REFERRED TO A SPECIALIST, IT MAY TAKE 1-2 WEEKS TO SCHEDULE/PROCESS THE REFERRAL. IF YOU HAVE NOT HEARD FROM US/SPECIALIST IN TWO WEEKS, PLEASE GIVE [place] A CALL AT [phone].   THE PATIENT IS ENCOURAGED TO PRACTICE SOCIAL DISTANCING DUE TO THE COVID-19 PANDEMIC.

## 2023-03-27 NOTE — Assessment & Plan Note (Signed)
I will increase dose of Mounjaro to 7.5mg  weekly as requested. She is reminded to stop eating when full.

## 2023-03-27 NOTE — Assessment & Plan Note (Signed)
I will send rx amoxicillin 500mg  tid and hydromet syrup to use prn. She is encouraged to take full course of antibiotics.

## 2023-04-01 ENCOUNTER — Other Ambulatory Visit: Payer: Self-pay | Admitting: Internal Medicine

## 2023-04-01 ENCOUNTER — Encounter: Payer: Self-pay | Admitting: Internal Medicine

## 2023-04-01 MED ORDER — FLUCONAZOLE 150 MG PO TABS: ORAL_TABLET | ORAL | 0 refills | Status: DC

## 2023-04-06 LAB — HM DIABETES EYE EXAM

## 2023-04-09 ENCOUNTER — Encounter: Payer: Self-pay | Admitting: Internal Medicine

## 2023-04-13 ENCOUNTER — Other Ambulatory Visit: Payer: Self-pay | Admitting: Internal Medicine

## 2023-04-13 DIAGNOSIS — Z1231 Encounter for screening mammogram for malignant neoplasm of breast: Secondary | ICD-10-CM

## 2023-04-14 ENCOUNTER — Ambulatory Visit
Admission: RE | Admit: 2023-04-14 | Discharge: 2023-04-14 | Discharge status: Home / Self Care | Payer: Self-pay | Source: Ambulatory Visit | Attending: Internal Medicine | Admitting: Internal Medicine

## 2023-04-14 DIAGNOSIS — Z1231 Encounter for screening mammogram for malignant neoplasm of breast: Secondary | ICD-10-CM

## 2023-05-24 ENCOUNTER — Other Ambulatory Visit: Payer: Self-pay | Admitting: Internal Medicine

## 2023-06-03 ENCOUNTER — Ambulatory Visit: Payer: BC Managed Care – PPO | Admitting: Internal Medicine

## 2023-06-03 VITALS — BP 120/70 | HR 81 | Temp 98.1°F | Ht 60.0 in | Wt 310.8 lb

## 2023-06-03 DIAGNOSIS — E1169 Type 2 diabetes mellitus with other specified complication: Secondary | ICD-10-CM | POA: Diagnosis not present

## 2023-06-03 DIAGNOSIS — R0982 Postnasal drip: Secondary | ICD-10-CM

## 2023-06-03 DIAGNOSIS — I10 Essential (primary) hypertension: Secondary | ICD-10-CM

## 2023-06-03 DIAGNOSIS — E669 Obesity, unspecified: Secondary | ICD-10-CM

## 2023-06-03 DIAGNOSIS — D508 Other iron deficiency anemias: Secondary | ICD-10-CM | POA: Diagnosis not present

## 2023-06-03 DIAGNOSIS — E66813 Obesity, class 3: Secondary | ICD-10-CM

## 2023-06-03 DIAGNOSIS — Z6841 Body Mass Index (BMI) 40.0 and over, adult: Secondary | ICD-10-CM

## 2023-06-03 MED ORDER — CHLORPHEN-PE-ACETAMINOPHEN 4-10-325 MG PO TABS: ORAL_TABLET | ORAL | 0 refills | Status: AC

## 2023-06-03 MED ORDER — MOUNJARO 7.5 MG/0.5ML ~~LOC~~ SOAJ: 7.5000 mg | SUBCUTANEOUS | 1 refills | Status: DC

## 2023-06-03 MED ORDER — OLMESARTAN MEDOXOMIL-HCTZ 20-12.5 MG PO TABS: 1.0000 | ORAL_TABLET | Freq: Every day | ORAL | 2 refills | Status: AC

## 2023-06-03 MED ORDER — OLOPATADINE HCL 0.2 % OP SOLN: OPHTHALMIC | 1 refills | Status: AC

## 2023-06-03 MED ORDER — ALBUTEROL SULFATE HFA 108 (90 BASE) MCG/ACT IN AERS: INHALATION_SPRAY | RESPIRATORY_TRACT | 3 refills | Status: AC

## 2023-06-03 NOTE — Assessment & Plan Note (Signed)
 She was congratulated on her 23 lbs weight loss since February 2025. She is encouraged to incorporate more exercise into her weekly routine.

## 2023-06-03 NOTE — Assessment & Plan Note (Signed)
 Chronic, she is currently doing well on Mounjaro  7.5mg  weekly w/ occasional nausea.. She is reminded to stop eating when full and to optimize her protein intake.

## 2023-06-03 NOTE — Patient Instructions (Signed)

## 2023-06-03 NOTE — Assessment & Plan Note (Signed)
 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 - Check blood count and iron panel - Provide alternative iron supplement samples if available.

## 2023-06-03 NOTE — Assessment & Plan Note (Signed)
 Chronic, well controlled.  She will continue with olmesartan /hct 2012.5mg  daily.  She is encouraged to follow low sodium diet.  She will f/u in 3-4 months for re-evaluation.

## 2023-06-03 NOTE — Progress Notes (Signed)
 I,Victoria T Basil Lim, CMA,acting as a Neurosurgeon for Smiley Dung, MD.,have documented all relevant documentation on the behalf of Smiley Dung, MD,as directed by  Smiley Dung, MD while in the presence of Smiley Dung, MD.  Subjective:  Patient ID: Evelyn Wade , female    DOB: May 16, 1970 , 53 y.o.   MRN: [id number]  Chief Complaint  Patient presents with   Diabetes    Patient presents today for diabetes & bpc. She reports compliance with medications. Denies headache, chest pain & sob.    Hypertension    HPI Discussed the use of AI scribe software for clinical note transcription with the patient, who gave verbal consent to proceed.  History of Present Illness Evelyn Wade is a 53 year old female with diabetes and hypertension who presents for a routine follow-up.  She is currently taking Olmesartan  20/12.5 mg, which is due for a refill. She has lost 23 pounds recently and experiences occasional nausea, which she attributes to a recent medication change (tirzepatide ). She sometimes uses Zofran  for relief.  She has a history of anemia and is taking Integra for iron supplementation, which causes constipation. Her menstrual cycles are sometimes heavy, potentially contributing to her anemia.  She has allergies and uses Pataday  eye drops, typically one drop twice a day. She also uses a homeopathic remedy, oxalococcinum, when she feels like she is getting sick. She has an inhaler from three years ago but does not use it regularly.  She is taking Linzess  for bowel issues and has been taking two pills, as the initial dose did not relieve her symptoms.   Patient presents today for a DM & BPC check. Patient reports compliance with all medications. She has been able to control her DM with diet and lifestyle changes.  She denies having any headaches, chest pain and shortness of breath.  She denies having any  specific questions or concerns.    Diabetes She presents for her follow-up  diabetic visit. She has type 2 diabetes mellitus. Her disease course has been stable. There are no hypoglycemic associated symptoms. Pertinent negatives for diabetes include no blurred vision and no chest pain. There are no hypoglycemic complications. Symptoms are stable. Risk factors for coronary artery disease include diabetes mellitus, hypertension, obesity and sedentary lifestyle. Her weight is decreasing steadily. She is following a diabetic diet. Her breakfast blood glucose is taken between 7-8 am. Her breakfast blood glucose range is generally 90-110 mg/dl.  Hypertension This is a chronic problem. The current episode started more than 1 year ago. The problem is controlled. Pertinent negatives include no blurred vision or chest pain. Risk factors for coronary artery disease include diabetes mellitus, dyslipidemia and obesity. Past treatments include angiotensin blockers and diuretics.     Past Medical History:  Diagnosis Date   Diabetes mellitus without complication (HCC)    Hypertension    Pneumonia    hx   Sleep apnea    cpap 4-5 yrs     Family History  Problem Relation Age of Onset   Ovarian cancer Mother    Heart attack Father    Diabetes Father    Hypertension Brother      Current Outpatient Medications:    docusate sodium  (COLACE) 250 MG capsule, Take 1 capsule (250 mg total) by mouth daily., Disp: 10 capsule, Rfl: 0   Fe Fum-FePoly-Vit C-Vit B3 (INTEGRA) 62.5-62.5-40-3 MG CAPS, Take 1 tablet by mouth daily., Disp: 90 capsule, Rfl: 1   glucose blood  test strip, Use as instructed, Disp: 50 each, Rfl: 12   HYDROcodone  bit-homatropine (HYDROMET) 5-1.5 MG/5ML syrup, Take 5 mLs by mouth every 6 (six) hours as needed., Disp: 120 mL, Rfl: 0   LINZESS  145 MCG CAPS capsule, TAKE 1 CAPSULE BY MOUTH DAILY BEFORE BREAKFAST., Disp: 90 capsule, Rfl: 1   Olopatadine  HCl (PATADAY  OP), Place 1 drop into both eyes daily as needed (allergies)., Disp: , Rfl:    Olopatadine  HCl 0.2 % SOLN,  Insert one gtt OU twice daily, Disp: 5 mL, Rfl: 1   ondansetron  (ZOFRAN ) 4 MG tablet, Take 1 tablet (4 mg total) by mouth every 8 (eight) hours as needed for nausea or vomiting., Disp: 20 tablet, Rfl: 0   traMADol  (ULTRAM ) 50 MG tablet, Take 1 tablet (50 mg total) by mouth 3 (three) times daily when necessary, Disp: 30 tablet, Rfl: 0   albuterol  (VENTOLIN  HFA) 108 (90 Base) MCG/ACT inhaler, Inhale 2 puffs every 4 to 8 hours as needed, Disp: 3 each, Rfl: 3   Chlorphen-PE-Acetaminophen  4-10-325 MG TABS, One tab po twice daily prn, Disp: 20 tablet, Rfl: 0   olmesartan -hydrochlorothiazide (BENICAR  HCT) 20-12.5 MG tablet, Take 1 tablet by mouth daily., Disp: 90 tablet, Rfl: 2   tirzepatide  (MOUNJARO ) 7.5 MG/0.5ML Pen, Inject 7.5 mg into the skin once a week., Disp: 2 mL, Rfl: 1   No Known Allergies   Review of Systems  Constitutional: Negative.   Eyes:  Negative for blurred vision.  Respiratory: Negative.    Cardiovascular: Negative.  Negative for chest pain.  Gastrointestinal: Negative.   Neurological: Negative.   Psychiatric/Behavioral: Negative.       Today's Vitals   06/03/23 1202  BP: 120/70  Pulse: 81  Temp: 98.1 F (36.7 C)  SpO2: 98%  Weight: (!) 310 lb 12.8 oz (141 kg)  Height: 5' (1.524 m)   Body mass index is 60.7 kg/m.  Wt Readings from Last 3 Encounters:  06/03/23 (!) 310 lb 12.8 oz (141 kg)  03/17/23 (!) 333 lb 9.6 oz (151.3 kg)  02/02/23 (!) 323 lb 6.4 oz (146.7 kg)     Objective:  Physical Exam Vitals and nursing note reviewed.  Constitutional:      Appearance: Normal appearance. She is obese.  HENT:     Head: Normocephalic and atraumatic.  Eyes:     Extraocular Movements: Extraocular movements intact.  Cardiovascular:     Rate and Rhythm: Normal rate and regular rhythm.     Heart sounds: Normal heart sounds.  Pulmonary:     Effort: Pulmonary effort is normal.     Breath sounds: Normal breath sounds.  Musculoskeletal:     Cervical back: Normal range of  motion.  Skin:    General: Skin is warm.  Neurological:     General: No focal deficit present.     Mental Status: She is alert.  Psychiatric:        Mood and Affect: Mood normal.        Behavior: Behavior normal.         Assessment And Plan:  Type 2 diabetes mellitus with obesity (HCC) Assessment & Plan: Chronic, she is currently doing well on Mounjaro  7.5mg  weekly w/ occasional nausea.. She is reminded to stop eating when full and to optimize her protein intake.   Orders: -     Hemoglobin A1c -     BMP8+eGFR  Essential hypertension, benign Assessment & Plan: Chronic, well controlled.  She will continue with olmesartan /hct 2012.5mg  daily.  She  is encouraged to follow low sodium diet.  She will f/u in 3-4 months for re-evaluation.    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 - Check blood count and iron panel - Provide alternative iron supplement samples if available.  Orders: -     CBC -     Iron, TIBC and Ferritin Panel  Class 3 severe obesity due to excess calories with serious comorbidity and body mass index (BMI) of 60.0 to 69.9 in adult [facility]) Assessment & Plan: She was congratulated on her 23 lbs weight loss since February 2025. She is encouraged to incorporate more exercise into her weekly routine.    Other orders -     Albuterol  Sulfate HFA; Inhale 2 puffs every 4 to 8 hours as needed  Dispense: 3 each; Refill: 3 -     Olmesartan  Medoxomil-HCTZ; Take 1 tablet by mouth daily.  Dispense: 90 tablet; Refill: 2 -     Olopatadine  HCl; Insert one gtt OU twice daily  Dispense: 5 mL; Refill: 1 -     Mounjaro ; Inject 7.5 mg into the skin once a week.  Dispense: 2 mL; Refill: 1    Return for 4 month dm f/u.Aaron Aas  Patient was given  opportunity to ask questions. Patient verbalized understanding of the plan and was able to repeat key elements of the plan. All questions were answered to their satisfaction.   I, Smiley Dung, MD, have reviewed all documentation for this visit. The documentation on 06/03/23 for the exam, diagnosis, procedures, and orders are all accurate and complete.   IF YOU HAVE BEEN REFERRED TO A SPECIALIST, IT MAY TAKE 1-2 WEEKS TO SCHEDULE/PROCESS THE REFERRAL. IF YOU HAVE NOT HEARD FROM US /SPECIALIST IN TWO WEEKS, PLEASE GIVE US  A CALL AT [phone].   THE PATIENT IS ENCOURAGED TO PRACTICE SOCIAL DISTANCING DUE TO THE COVID-19 PANDEMIC.

## 2023-06-04 LAB — IRON,TIBC AND FERRITIN PANEL
Ferritin: 31 ng/mL (ref 15–150)
Iron Saturation: 11 % — ABNORMAL LOW (ref 15–55)
Iron: 36 ug/dL (ref 27–159)
Total Iron Binding Capacity: 340 ug/dL (ref 250–450)
UIBC: 304 ug/dL (ref 131–425)

## 2023-06-04 LAB — BMP8+EGFR
BUN/Creatinine Ratio: 28 — ABNORMAL HIGH (ref 9–23)
BUN: 21 mg/dL (ref 6–24)
CO2: 20 mmol/L (ref 20–29)
Calcium: 10.9 mg/dL — ABNORMAL HIGH (ref 8.7–10.2)
Chloride: 103 mmol/L (ref 96–106)
Creatinine, Ser: 0.75 mg/dL (ref 0.57–1.00)
Glucose: 86 mg/dL (ref 70–99)
Potassium: 4.5 mmol/L (ref 3.5–5.2)
Sodium: 137 mmol/L (ref 134–144)
eGFR: 97 mL/min/{1.73_m2} (ref >59)

## 2023-06-04 LAB — CBC
Hematocrit: 35.8 % (ref 34.0–46.6)
Hemoglobin: 11.1 g/dL (ref 11.1–15.9)
MCH: 24 pg — ABNORMAL LOW (ref 26.6–33.0)
MCHC: 31 g/dL — ABNORMAL LOW (ref 31.5–35.7)
MCV: 78 fL — ABNORMAL LOW (ref 79–97)
Platelets: 371 10*3/uL (ref 150–450)
RBC: 4.62 x10E6/uL (ref 3.77–5.28)
RDW: 16.4 % — ABNORMAL HIGH (ref 11.7–15.4)
WBC: 9.3 10*3/uL (ref 3.4–10.8)

## 2023-06-04 LAB — HEMOGLOBIN A1C
Est. average glucose Bld gHb Est-mCnc: 105 mg/dL
Hgb A1c MFr Bld: 5.3 % (ref 4.8–5.6)

## 2023-06-07 ENCOUNTER — Encounter: Payer: Self-pay | Admitting: Internal Medicine

## 2023-06-28 ENCOUNTER — Other Ambulatory Visit: Payer: Self-pay

## 2023-06-28 ENCOUNTER — Encounter: Payer: Self-pay | Admitting: Internal Medicine

## 2023-06-28 DIAGNOSIS — E1169 Type 2 diabetes mellitus with other specified complication: Secondary | ICD-10-CM

## 2023-06-28 MED ORDER — MOUNJARO 7.5 MG/0.5ML ~~LOC~~ SOAJ: 7.5000 mg | SUBCUTANEOUS | 1 refills | Status: DC

## 2023-07-06 ENCOUNTER — Ambulatory Visit: Admitting: Podiatry

## 2023-08-10 ENCOUNTER — Encounter (INDEPENDENT_AMBULATORY_CARE_PROVIDER_SITE_OTHER): Admitting: Podiatry

## 2023-08-10 DIAGNOSIS — Z91199 Patient's noncompliance with other medical treatment and regimen due to unspecified reason: Secondary | ICD-10-CM

## 2023-08-10 NOTE — Progress Notes (Signed)
Patient did not show for scheduled appointment today.

## 2023-08-22 ENCOUNTER — Other Ambulatory Visit: Payer: Self-pay | Admitting: Internal Medicine

## 2023-08-22 DIAGNOSIS — E1169 Type 2 diabetes mellitus with other specified complication: Secondary | ICD-10-CM

## 2023-10-04 ENCOUNTER — Encounter: Payer: Self-pay | Admitting: Internal Medicine

## 2023-10-04 ENCOUNTER — Ambulatory Visit: Admitting: Internal Medicine

## 2023-10-04 VITALS — BP 120/73 | HR 86 | Temp 98.2°F | Ht 60.0 in | Wt 286.0 lb

## 2023-10-04 DIAGNOSIS — E66813 Obesity, class 3: Secondary | ICD-10-CM | POA: Diagnosis not present

## 2023-10-04 DIAGNOSIS — I10 Essential (primary) hypertension: Secondary | ICD-10-CM | POA: Diagnosis not present

## 2023-10-04 DIAGNOSIS — E1169 Type 2 diabetes mellitus with other specified complication: Secondary | ICD-10-CM | POA: Diagnosis not present

## 2023-10-04 DIAGNOSIS — Z638 Other specified problems related to primary support group: Secondary | ICD-10-CM

## 2023-10-04 DIAGNOSIS — K5903 Drug induced constipation: Secondary | ICD-10-CM | POA: Diagnosis not present

## 2023-10-04 DIAGNOSIS — Z6841 Body Mass Index (BMI) 40.0 and over, adult: Secondary | ICD-10-CM

## 2023-10-04 DIAGNOSIS — E669 Obesity, unspecified: Secondary | ICD-10-CM

## 2023-10-04 MED ORDER — MOUNJARO 7.5 MG/0.5ML ~~LOC~~ SOAJ
7.5000 mg | SUBCUTANEOUS | 1 refills | Status: DC
Start: 2023-10-04 — End: 2023-12-13

## 2023-10-04 NOTE — Assessment & Plan Note (Addendum)
 Chronic, she is currently doing well on Mounjaro  7.5mg  weekly w/ occasional nausea.. She is reminded to stop eating when full and to optimize her protein intake.  - She agrees to continue with this dose of Mounjaro  - Encouraged to aim for at least 150 minutes of exercise per week - Incorporate more protein into her meals

## 2023-10-04 NOTE — Patient Instructions (Signed)

## 2023-10-04 NOTE — Progress Notes (Signed)
 I,Evelyn Wade, CMA,acting as a Neurosurgeon for Evelyn LOISE Slocumb, MD.,have documented all relevant documentation on the behalf of Evelyn LOISE Slocumb, MD,as directed by  Evelyn LOISE Slocumb, MD while in the presence of Evelyn LOISE Slocumb, MD.  Subjective:  Patient ID: Evelyn Wade , female    DOB: Jul 10, 1970 , 53 y.o.   MRN: [id number]  Chief Complaint  Patient presents with   Diabetes    Patient presents today for a DM & BPC check. Patient reports compliance with all medications. She has been able to control her DM with diet and lifestyle changes.  She denies having any headaches, chest pain and shortness of breath.  She denies having any  specific questions or concerns.    Hypertension    HPI Discussed the use of AI scribe software for clinical note transcription with the patient, who gave verbal consent to proceed.  History of Present Illness Evelyn Wade is a 53 year old female who presents for weight management and medication review.  She has reached a weight loss plateau despite losing 47 pounds since February. She attributes this to insufficient eating and a need to enhance her exercise routine. Currently, she walks a little but feels she needs to increase her physical activity.  Her current medications include Linzess , olmesartan  20/12.5 mg, and tirzepatide  7.5 mg. She experiences constipation, which she manages with Mag O7 taken at night. She is concerned about the timing of her meals and medication, noting that she sometimes skips meals to accommodate the medication schedule.  Her typical breakfast consists of eggs, meat, and blackberries, but she struggles with eating eggs and sometimes opts for protein shakes instead. She prefers clear protein shakes over milk-based ones and is considering meal prepping with egg whites, tomatoes, and spinach.  No significant symptoms are reported. She is still having menstrual cycles and denies being in menopause or that her mood or relationship is  affected by menopause.   Diabetes She presents for her follow-up diabetic visit. She has type 2 diabetes mellitus. Her disease course has been stable. There are no hypoglycemic associated symptoms. Pertinent negatives for diabetes include no blurred vision, no chest pain, no polydipsia, no polyphagia and no polyuria. There are no hypoglycemic complications. Symptoms are stable. Risk factors for coronary artery disease include diabetes mellitus, hypertension, obesity and sedentary lifestyle. Her weight is decreasing steadily. She is following a diabetic diet. Her breakfast blood glucose is taken between 7-8 am. Her breakfast blood glucose range is generally 90-110 mg/dl.  Hypertension This is a chronic problem. The current episode started more than 1 year ago. The problem is controlled. Pertinent negatives include no blurred vision or chest pain. Risk factors for coronary artery disease include diabetes mellitus, dyslipidemia and obesity. Past treatments include angiotensin blockers and diuretics.     Past Medical History:  Diagnosis Date   Diabetes mellitus without complication (HCC)    Hypertension    Pneumonia    hx   Sleep apnea    cpap 4-5 yrs     Family History  Problem Relation Age of Onset   Ovarian cancer Mother    Heart attack Father    Diabetes Father    Hypertension Brother      Current Outpatient Medications:    albuterol  (VENTOLIN  HFA) 108 (90 Base) MCG/ACT inhaler, Inhale 2 puffs every 4 to 8 hours as needed, Disp: 3 each, Rfl: 3   Chlorphen-PE-Acetaminophen  4-10-325 MG TABS, One tab po twice daily prn, Disp: 20 tablet,  Rfl: 0   docusate sodium  (COLACE) 250 MG capsule, Take 1 capsule (250 mg total) by mouth daily., Disp: 10 capsule, Rfl: 0   glucose blood test strip, Use as instructed, Disp: 50 each, Rfl: 12   LINZESS  145 MCG CAPS capsule, TAKE 1 CAPSULE BY MOUTH DAILY BEFORE BREAKFAST., Disp: 90 capsule, Rfl: 1   olmesartan -hydrochlorothiazide (BENICAR  HCT) 20-12.5 MG  tablet, Take 1 tablet by mouth daily., Disp: 90 tablet, Rfl: 2   Olopatadine  HCl (PATADAY  OP), Place 1 drop into both eyes daily as needed (allergies)., Disp: , Rfl:    Olopatadine  HCl 0.2 % SOLN, Insert one gtt OU twice daily, Disp: 5 mL, Rfl: 1   ondansetron  (ZOFRAN ) 4 MG tablet, Take 1 tablet (4 mg total) by mouth every 8 (eight) hours as needed for nausea or vomiting., Disp: 20 tablet, Rfl: 0   traMADol  (ULTRAM ) 50 MG tablet, Take 1 tablet (50 mg total) by mouth 3 (three) times daily when necessary, Disp: 30 tablet, Rfl: 0   tirzepatide  (MOUNJARO ) 7.5 MG/0.5ML Pen, Inject 7.5 mg into the skin once a week., Disp: 2 mL, Rfl: 1   No Known Allergies   Review of Systems  Constitutional: Negative.   Eyes: Negative.  Negative for blurred vision.  Respiratory: Negative.    Cardiovascular: Negative.  Negative for chest pain.  Gastrointestinal: Negative.   Endocrine: Negative for polydipsia, polyphagia and polyuria.  Musculoskeletal: Negative.   Skin: Negative.   Psychiatric/Behavioral: Negative.         She admits to family stressors which are concerning to her. Her son is still having issues dealing with his grandmother's passing.      Today's Vitals   10/04/23 1208  BP: 120/73  Pulse: 86  Temp: 98.2 F (36.8 C)  TempSrc: Oral  Weight: 286 lb (129.7 kg)  Height: 5' (1.524 m)  PainSc: 0-No pain   Body mass index is 55.86 kg/m.  Wt Readings from Last 3 Encounters:  10/04/23 286 lb (129.7 kg)  06/03/23 (!) 310 lb 12.8 oz (141 kg)  03/17/23 (!) 333 lb 9.6 oz (151.3 kg)    The 10-year ASCVD risk score (Arnett DK, et al., 2019) is: 3.1%   Values used to calculate the score:     Age: 70 years     Clincally relevant sex: Female     Is Non-Hispanic African American: Yes     Diabetic: Yes     Tobacco smoker: No     Systolic Blood Pressure: 120 mmHg     Is BP treated: Yes     HDL Cholesterol: 80 mg/dL     Total Cholesterol: 165 mg/dL  Objective:  Physical Exam Vitals and  nursing note reviewed.  Constitutional:      Appearance: Normal appearance. She is obese.  HENT:     Head: Normocephalic and atraumatic.  Eyes:     Extraocular Movements: Extraocular movements intact.  Cardiovascular:     Rate and Rhythm: Normal rate and regular rhythm.     Heart sounds: Normal heart sounds.  Pulmonary:     Effort: Pulmonary effort is normal.     Breath sounds: Normal breath sounds.  Musculoskeletal:     Cervical back: Normal range of motion.  Skin:    General: Skin is warm.  Neurological:     General: No focal deficit present.     Mental Status: She is alert.  Psychiatric:        Mood and Affect: Mood normal.  Behavior: Behavior normal.         Assessment And Plan:  Type 2 diabetes mellitus with obesity (HCC) Assessment & Plan: Chronic, she is currently doing well on Mounjaro  7.5mg  weekly w/ occasional nausea.. She is reminded to stop eating when full and to optimize her protein intake.  - She agrees to continue with this dose of Mounjaro  - Encouraged to aim for at least 150 minutes of exercise per week - Incorporate more protein into her meals  Orders: -     CMP14+EGFR -     Hemoglobin A1c -     Mounjaro ; Inject 7.5 mg into the skin once a week.  Dispense: 2 mL; Refill: 1  Essential hypertension, benign Assessment & Plan: Chronic, well controlled.  She will continue with olmesartan /hct 20/12.5mg  daily.  She is encouraged to follow low sodium diet.  She will f/u in 3-4 months for re-evaluation.     Drug-induced constipation Assessment & Plan: She manages constipation with Mag O7 at night and is concerned about timing with meals and effects during work hours. - Advise taking Mag O7 two hours after dinner. - Consider Miralax once or twice weekly when needed   Class 3 severe obesity due to excess calories with serious comorbidity and body mass index (BMI) of 50.0 to 59.9 in adult Assessment & Plan: She lost 47 pounds since February, showing  progress. She feels at a plateau and needs to improve exercise and protein intake. She wishes to continue tirzepatide  7.5 mg for another month before considering a dose increase. - Continue tirzepatide  7.5 mg for one more month. - Encourage increased exercise to prevent muscle atrophy. - Reassess weight and medication dose in 10 weeks.   Stress due to family tension -     Ambulatory referral to Psychology   Return in 10 weeks (on 12/13/2023), or if symptoms worsen or fail to improve.  Patient was given opportunity to ask questions. Patient verbalized understanding of the plan and was able to repeat key elements of the plan. All questions were answered to their satisfaction.    I, Evelyn LOISE Slocumb, MD, have reviewed all documentation for this visit. The documentation on 10/04/23 for the exam, diagnosis, procedures, and orders are all accurate and complete.   IF YOU HAVE BEEN REFERRED TO A SPECIALIST, IT MAY TAKE 1-2 WEEKS TO SCHEDULE/PROCESS THE REFERRAL. IF YOU HAVE NOT HEARD FROM US /SPECIALIST IN TWO WEEKS, PLEASE GIVE US  A CALL AT [phone].

## 2023-10-04 NOTE — Assessment & Plan Note (Addendum)
 Chronic, well controlled.  She will continue with olmesartan /hct 2012.5mg  daily.  She is encouraged to follow low sodium diet.  She will f/u in 3-4 months for re-evaluation.

## 2023-10-04 NOTE — Assessment & Plan Note (Signed)
 She manages constipation with Mag O7 at night and is concerned about timing with meals and effects during work hours. - Advise taking Mag O7 two hours after dinner. - Consider Miralax once or twice weekly when needed

## 2023-10-04 NOTE — Assessment & Plan Note (Signed)
 She lost 47 pounds since February, showing progress. She feels at a plateau and needs to improve exercise and protein intake. She wishes to continue tirzepatide  7.5 mg for another month before considering a dose increase. - Continue tirzepatide  7.5 mg for one more month. - Encourage increased exercise to prevent muscle atrophy. - Reassess weight and medication dose in 10 weeks.

## 2023-10-05 ENCOUNTER — Ambulatory Visit: Payer: Self-pay | Admitting: Internal Medicine

## 2023-10-05 LAB — CMP14+EGFR
ALT: 13 IU/L (ref 0–32)
AST: 13 IU/L (ref 0–40)
Albumin: 4.2 g/dL (ref 3.9–4.9)
Alkaline Phosphatase: 77 IU/L (ref 44–121)
BUN/Creatinine Ratio: 20 (ref 9–23)
BUN: 18 mg/dL (ref 6–24)
Bilirubin Total: 0.3 mg/dL (ref 0.0–1.2)
CO2: 23 mmol/L (ref 20–29)
Calcium: 10.7 mg/dL — ABNORMAL HIGH (ref 8.7–10.2)
Chloride: 102 mmol/L (ref 96–106)
Creatinine, Ser: 0.9 mg/dL (ref 0.57–1.00)
Globulin, Total: 2.7 g/dL (ref 1.5–4.5)
Glucose: 75 mg/dL (ref 70–99)
Potassium: 4.6 mmol/L (ref 3.5–5.2)
Sodium: 139 mmol/L (ref 134–144)
Total Protein: 6.9 g/dL (ref 6.0–8.5)
eGFR: 78 mL/min/1.73 (ref >59)

## 2023-10-05 LAB — HEMOGLOBIN A1C
Est. average glucose Bld gHb Est-mCnc: 100 mg/dL
Hgb A1c MFr Bld: 5.1 % (ref 4.8–5.6)

## 2023-11-30 ENCOUNTER — Other Ambulatory Visit: Payer: Self-pay | Admitting: Internal Medicine

## 2023-12-13 ENCOUNTER — Other Ambulatory Visit: Payer: Self-pay | Admitting: Internal Medicine

## 2023-12-13 DIAGNOSIS — E119 Type 2 diabetes mellitus without complications: Secondary | ICD-10-CM

## 2023-12-14 ENCOUNTER — Ambulatory Visit: Payer: Self-pay | Admitting: Internal Medicine

## 2023-12-14 ENCOUNTER — Encounter: Payer: Self-pay | Admitting: Internal Medicine

## 2023-12-14 VITALS — BP 108/70 | HR 71 | Temp 98.3°F | Ht 60.0 in | Wt 276.2 lb

## 2023-12-14 DIAGNOSIS — L304 Erythema intertrigo: Secondary | ICD-10-CM | POA: Diagnosis not present

## 2023-12-14 DIAGNOSIS — E669 Obesity, unspecified: Secondary | ICD-10-CM

## 2023-12-14 DIAGNOSIS — I10 Essential (primary) hypertension: Secondary | ICD-10-CM | POA: Diagnosis not present

## 2023-12-14 DIAGNOSIS — E1169 Type 2 diabetes mellitus with other specified complication: Secondary | ICD-10-CM

## 2023-12-14 DIAGNOSIS — K5909 Other constipation: Secondary | ICD-10-CM

## 2023-12-14 DIAGNOSIS — E65 Localized adiposity: Secondary | ICD-10-CM

## 2023-12-14 MED ORDER — LINACLOTIDE 145 MCG PO CAPS: 145.0000 ug | ORAL_CAPSULE | Freq: Every day | ORAL | 1 refills | Status: AC

## 2023-12-14 NOTE — Progress Notes (Signed)
 I,[name], CMA,acting as a neurosurgeon for [name], MD.,have documented all relevant documentation on the behalf of [name], MD,as directed by  [name], MD while in the presence of [name], MD.  Subjective:  Patient ID: Evelyn Wade , female    DOB: 1970-03-14 , 53 y.o.   MRN: [id number]  Chief Complaint  Patient presents with   Diabetes    Patient presents today for a DM & BPC check. Patient reports compliance with all medications. She has been able to control her DM with diet and lifestyle changes.  She denies having any headaches, chest pain and shortness of breath.  She denies having any  specific questions or concerns.    Hypertension    HPI Discussed the use of AI scribe software for clinical note transcription with the patient, who gave verbal consent to proceed.  History of Present Illness Evelyn Wade is a 53 year old female with diabetes and hypertension who presents for a routine follow-up visit.  She feels good overall and has been exercising more than before. She is currently taking olmesartan  20 mg/12.5 mg and Mounjaro  7.5 mg. She has lost 10 pounds since August and 34 pounds since April, attributing this to her current medication regimen and lifestyle changes. She feels better and is excited about her progress.  She is taking Linzess  but has not received a refill yet. She experiences constipation and bloating, sometimes going days without a bowel movement. She uses Miralax but finds it insufficient, and has started taking half a dose but experienced stomach pain. She also uses Dulcolax suppositories when necessary.  She discusses issues with her abdominal pannus, which causes irritation and rashes.  Her highest weight was 415 pounds, and she started her weight loss journey at 310 pounds in April. Her current goal is to reach 248 pounds. She incorporates protein drinks and bone broth into her diet to increase protein  intake.   Patient presents today for a DM & BPC check. Patient reports compliance with all medications. She has been able to control her DM with diet and lifestyle changes.  She denies having any headaches, chest pain and shortness of breath.  She denies having any  specific questions or concerns.    Diabetes She presents for her follow-up diabetic visit. She has type 2 diabetes mellitus. Her disease course has been stable. There are no hypoglycemic associated symptoms. Pertinent negatives for diabetes include no blurred vision, no chest pain, no polydipsia, no polyphagia and no polyuria. There are no hypoglycemic complications. Symptoms are stable. Risk factors for coronary artery disease include diabetes mellitus, hypertension, obesity and sedentary lifestyle. Her weight is decreasing steadily. She is following a diabetic diet. Her breakfast blood glucose is taken between 7-8 am. Her breakfast blood glucose range is generally 90-110 mg/dl.  Hypertension This is a chronic problem. The current episode started more than 1 year ago. The problem is controlled. Pertinent negatives include no blurred vision or chest pain. Risk factors for coronary artery disease include diabetes mellitus, dyslipidemia and obesity. Past treatments include angiotensin blockers and diuretics.     Past Medical History:  Diagnosis Date   Diabetes mellitus without complication (HCC)    Hypertension    Pneumonia    hx   Sleep apnea    cpap 4-5 yrs     Family History  Problem Relation Age of Onset   Ovarian cancer Mother    Heart attack Father  Diabetes Father    Hypertension Brother      Current Outpatient Medications:    albuterol  (VENTOLIN  HFA) 108 (90 Base) MCG/ACT inhaler, Inhale 2 puffs every 4 to 8 hours as needed, Disp: 3 each, Rfl: 3   Chlorphen-PE-Acetaminophen  4-10-325 MG TABS, One tab po twice daily prn, Disp: 20 tablet, Rfl: 0   docusate sodium  (COLACE) 250 MG capsule, Take 1 capsule (250 mg  total) by mouth daily., Disp: 10 capsule, Rfl: 0   glucose blood test strip, Use as instructed, Disp: 50 each, Rfl: 12   olmesartan -hydrochlorothiazide (BENICAR  HCT) 20-12.5 MG tablet, Take 1 tablet by mouth daily., Disp: 90 tablet, Rfl: 2   Olopatadine  HCl (PATADAY  OP), Place 1 drop into both eyes daily as needed (allergies)., Disp: , Rfl:    Olopatadine  HCl 0.2 % SOLN, Insert one gtt OU twice daily, Disp: 5 mL, Rfl: 1   ondansetron  (ZOFRAN ) 4 MG tablet, Take 1 tablet (4 mg total) by mouth every 8 (eight) hours as needed for nausea or vomiting., Disp: 20 tablet, Rfl: 0   tirzepatide  (MOUNJARO ) 7.5 MG/0.5ML Pen, INJECT 7.5 MG SUBCUTANEOUSLY WEEKLY, Disp: 2 mL, Rfl: 1   traMADol  (ULTRAM ) 50 MG tablet, Take 1 tablet (50 mg total) by mouth 3 (three) times daily when necessary, Disp: 30 tablet, Rfl: 0   linaclotide  (LINZESS ) 145 MCG CAPS capsule, Take 1 capsule (145 mcg total) by mouth daily before breakfast. TAKE 1 CAPSULE BY MOUTH DAILY BEFORE BREAKFAST., Disp: 90 capsule, Rfl: 1   No Known Allergies   Review of Systems  Constitutional: Negative.   Eyes:  Negative for blurred vision.  Respiratory: Negative.    Cardiovascular: Negative.  Negative for chest pain.  Gastrointestinal: Negative.   Endocrine: Negative for polydipsia, polyphagia and polyuria.  Neurological: Negative.   Psychiatric/Behavioral: Negative.       Today's Vitals   12/14/23 1549  BP: 108/70  Pulse: 71  Temp: 98.3 F (36.8 C)  SpO2: 98%  Weight: 276 lb 3.2 oz (125.3 kg)  Height: 5' (1.524 m)   Body mass index is 53.94 kg/m.  Wt Readings from Last 3 Encounters:  12/14/23 276 lb 3.2 oz (125.3 kg)  10/04/23 286 lb (129.7 kg)  06/03/23 (!) 310 lb 12.8 oz (141 kg)     Objective:  Physical Exam Vitals and nursing note reviewed.  Constitutional:      Appearance: Normal appearance. She is obese.  HENT:     Head: Normocephalic and atraumatic.  Eyes:     Extraocular Movements: Extraocular movements intact.   Cardiovascular:     Rate and Rhythm: Normal rate and regular rhythm.     Heart sounds: Normal heart sounds.  Pulmonary:     Effort: Pulmonary effort is normal.     Breath sounds: Normal breath sounds.  Musculoskeletal:     Cervical back: Normal range of motion.  Skin:    General: Skin is warm.  Neurological:     General: No focal deficit present.     Mental Status: She is alert.  Psychiatric:        Mood and Affect: Mood normal.        Behavior: Behavior normal.         Assessment And Plan:  Type 2 diabetes mellitus in patient with obesity [facility]) Assessment & Plan: Managed with Mounjaro  7.5 mg. Weight loss of 34 pounds since April and 10 pounds since August. Concerns about protein intake and appetite with increased Mounjaro  dosage. - Continue Mounjaro  7.5 mg. -  Encouraged adequate protein intake. - Consider nutritional counseling if protein intake remains inadequate. - Follow up in January 2026 for full CPE.   Essential hypertension, benign Assessment & Plan: Chronic, well controlled.  She will continue with olmesartan /hct 20/12.5mg  daily.  She is encouraged to follow low sodium diet.  She will f/u in 3-4 months for re-evaluation.     Intertrigo Assessment & Plan: She would benefit from use of Gold Bond powder prn. If needed, may use Zstop or Nystatin powder.    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 Weight loss progress noted. Discussed potential surgical intervention for abdominal pannus contingent on reaching goal weight. Consideration of insurance coverage and referral to a plastic surgeon closer to goal weight. - Continue weight loss efforts. - Document episodes of erythema intertrigo with photographs. - Consider referral to a plastic surgeon closer to goal weight.   Chronic constipation Assessment & Plan: Chronic constipation with bloating and discomfort. Managed with Linzess  and Miralax, but symptoms persist. Discussed use of  Dulcolax suppositories for rectal vault clearance. - Continue Linzess  and Miralax. - Use Dulcolax suppositories as needed for rectal vault clearance.   Other orders -     linaCLOtide ; Take 1 capsule (145 mcg total) by mouth daily before breakfast. TAKE 1 CAPSULE BY MOUTH DAILY BEFORE BREAKFAST.  Dispense: 90 capsule; Refill: 1    Return if symptoms worsen or fail to improve.  Patient was given opportunity to ask questions. Patient verbalized understanding of the plan and was able to repeat key elements of the plan. All questions were answered to their satisfaction.   I, [name], MD, have reviewed all documentation for this visit. The documentation on 12/14/23 for the exam, diagnosis, procedures, and orders are all accurate and complete.  IF YOU HAVE BEEN REFERRED TO A SPECIALIST, IT MAY TAKE 1-2 WEEKS TO SCHEDULE/PROCESS THE REFERRAL. IF YOU HAVE NOT HEARD FROM US /SPECIALIST IN TWO WEEKS, PLEASE GIVE US  A CALL AT [phone].   THE PATIENT IS ENCOURAGED TO PRACTICE SOCIAL DISTANCING DUE TO THE COVID-19 PANDEMIC.

## 2023-12-14 NOTE — Assessment & Plan Note (Signed)
 Chronic, well controlled.  She will continue with olmesartan /hct 2012.5mg  daily.  She is encouraged to follow low sodium diet.  She will f/u in 3-4 months for re-evaluation.

## 2023-12-14 NOTE — Assessment & Plan Note (Signed)
 She would benefit from use of Gold Bond powder prn. If needed, may use Zstop or Nystatin powder.

## 2023-12-14 NOTE — Assessment & Plan Note (Signed)
 Obesity causing erythema intertrigo. Weight loss progress noted. Discussed potential surgical intervention for abdominal pannus contingent on reaching goal weight. Consideration of insurance coverage and referral to a plastic surgeon closer to goal weight. - Continue weight loss efforts. - Document episodes of erythema intertrigo with photographs. - Consider referral to a plastic surgeon closer to goal weight.

## 2023-12-14 NOTE — Assessment & Plan Note (Signed)
 Chronic constipation with bloating and discomfort. Managed with Linzess  and Miralax, but symptoms persist. Discussed use of Dulcolax suppositories for rectal vault clearance. - Continue Linzess  and Miralax. - Use Dulcolax suppositories as needed for rectal vault clearance.

## 2023-12-14 NOTE — Assessment & Plan Note (Signed)
 Managed with Mounjaro  7.5 mg. Weight loss of 34 pounds since April and 10 pounds since August. Concerns about protein intake and appetite with increased Mounjaro  dosage. - Continue Mounjaro  7.5 mg. - Encouraged adequate protein intake. - Consider nutritional counseling if protein intake remains inadequate. - Follow up in January 2026 for full CPE.

## 2023-12-14 NOTE — Patient Instructions (Signed)

## 2023-12-15 ENCOUNTER — Telehealth: Payer: Self-pay

## 2024-02-06 ENCOUNTER — Other Ambulatory Visit: Payer: Self-pay | Admitting: Internal Medicine

## 2024-02-06 DIAGNOSIS — E669 Obesity, unspecified: Secondary | ICD-10-CM

## 2024-02-07 ENCOUNTER — Other Ambulatory Visit: Payer: Self-pay

## 2024-02-07 ENCOUNTER — Encounter: Payer: Self-pay | Admitting: Internal Medicine

## 2024-02-07 DIAGNOSIS — E669 Obesity, unspecified: Secondary | ICD-10-CM

## 2024-02-07 MED ORDER — MOUNJARO 7.5 MG/0.5ML ~~LOC~~ SOAJ: 7.5000 mg | SUBCUTANEOUS | 1 refills | Status: AC

## 2024-02-15 ENCOUNTER — Ambulatory Visit: Payer: Self-pay | Admitting: Internal Medicine

## 2024-02-15 ENCOUNTER — Encounter: Payer: Self-pay | Admitting: Internal Medicine

## 2024-02-15 VITALS — BP 110/80 | HR 80 | Temp 98.3°F | Ht 60.0 in | Wt 270.6 lb

## 2024-02-15 DIAGNOSIS — E669 Obesity, unspecified: Secondary | ICD-10-CM | POA: Diagnosis not present

## 2024-02-15 DIAGNOSIS — I10 Essential (primary) hypertension: Secondary | ICD-10-CM | POA: Diagnosis not present

## 2024-02-15 DIAGNOSIS — Z0000 Encounter for general adult medical examination without abnormal findings: Secondary | ICD-10-CM

## 2024-02-15 DIAGNOSIS — E1169 Type 2 diabetes mellitus with other specified complication: Secondary | ICD-10-CM | POA: Diagnosis not present

## 2024-02-15 LAB — POCT URINALYSIS DIPSTICK
Bilirubin, UA: NEGATIVE
Glucose, UA: NEGATIVE
Ketones, UA: NEGATIVE
Leukocytes, UA: NEGATIVE
Nitrite, UA: NEGATIVE
Protein, UA: NEGATIVE
Spec Grav, UA: 1.015
Urobilinogen, UA: 0.2 U/dL
pH, UA: 5.5

## 2024-02-15 MED ORDER — MOUNJARO 10 MG/0.5ML ~~LOC~~ SOAJ: 10.0000 mg | SUBCUTANEOUS | 2 refills | Status: DC

## 2024-02-15 NOTE — Assessment & Plan Note (Addendum)
 Chronic, diabetic foot exam was performed.  Blood sugars well controlled.  Weight loss of 16 pounds since August. Current medication Mounjaro  7.5 mg. Discussed increasing dosage for weight loss and glycemic control. - Increased Mounjaro  to 10 mg. - She is encouraged to aim for at least 150 minutes of exercise per week, including strength training.  - I DISCUSSED WITH THE PATIENT AT LENGTH REGARDING THE GOALS OF GLYCEMIC CONTROL AND POSSIBLE LONG-TERM COMPLICATIONS.  I  ALSO STRESSED THE IMPORTANCE OF COMPLIANCE WITH HOME GLUCOSE MONITORING, DIETARY RESTRICTIONS INCLUDING AVOIDANCE OF SUGARY DRINKS/PROCESSED FOODS,  ALONG WITH REGULAR EXERCISE.  I  ALSO STRESSED THE IMPORTANCE OF ANNUAL EYE EXAMS, SELF FOOT CARE AND COMPLIANCE WITH OFFICE VISITS.

## 2024-02-15 NOTE — Patient Instructions (Signed)

## 2024-02-15 NOTE — Assessment & Plan Note (Signed)

## 2024-02-15 NOTE — Assessment & Plan Note (Addendum)
 Chronic, controlled. Goal BP <130/80.  EKG performed, NSR w/ low voltage. Blood pressure 110/80 mmHg. Current medications olmesartan  and hydrochlorothiazide. Discussed exercise for blood pressure control. - Continue olmesartan  and hydrochlorothiazide. - Encouraged regular exercise. - Follow low sodium diet.

## 2024-02-15 NOTE — Progress Notes (Signed)
 I,[name], CMA,acting as a neurosurgeon for Evelyn LOISE Slocumb, MD.,have documented all relevant documentation on the behalf of Evelyn LOISE Slocumb, MD,as directed by  Evelyn LOISE Slocumb, MD while in the presence of Evelyn LOISE Slocumb, MD.  Subjective:    Patient ID: Evelyn Wade , female    DOB: Aug 11, 1970 , 54 y.o.   MRN: [id number]  Chief Complaint  Patient presents with   Annual Exam    Patient presents today for physical exam. She reports compliance with medication. She denies having any chest pain, SOB, or headaches. Patient has no other concerns today.  [facility]: [facility]. Letter sent for pap result.   Diabetes   Hypertension    HPI Discussed the use of AI scribe software for clinical note transcription with the patient, who gave verbal consent to proceed.  History of Present Illness Evelyn Wade is a 54 year old female with diabetes who presents for a physical and diabetes check.  She has experienced fluctuating weight and has lost 16 pounds since August. She is currently on Mounjaro  7.5 mg for diabetes management. She sometimes feels the needle does not go in properly, but does not see any medication leaking out. She recalls an incident where she wasted a dose because she forgot to unlock the pen.  She is on olmesartan /hydrochlorothiazide 20/12.5 mg.  Her family visited the day after Christmas, which was unusual for her as she typically spends Christmas with them. She feels 'in a funk' due to not being with her family during the holiday.   Diabetes She presents for her follow-up diabetic visit. She has type 2 diabetes mellitus. Her disease course has been stable. There are no hypoglycemic associated symptoms. Pertinent negatives for diabetes include no blurred vision and no chest pain. There are no hypoglycemic complications. Symptoms are stable. Risk factors for coronary artery disease include diabetes mellitus, hypertension, obesity and sedentary lifestyle. Her weight is decreasing  steadily. She is following a diabetic diet. Her breakfast blood glucose is taken between 7-8 am. Her breakfast blood glucose range is generally 90-110 mg/dl.  Hypertension This is a chronic problem. The current episode started more than 1 year ago. The problem is controlled. Pertinent negatives include no blurred vision or chest pain.     Past Medical History:  Diagnosis Date   Diabetes mellitus without complication (HCC)    Hypertension    Pneumonia    hx   Sleep apnea    cpap 4-5 yrs     Family History  Problem Relation Age of Onset   Ovarian cancer Mother    Heart attack Father    Diabetes Father    Hypertension Brother      Current Outpatient Medications:    albuterol  (VENTOLIN  HFA) 108 (90 Base) MCG/ACT inhaler, Inhale 2 puffs every 4 to 8 hours as needed, Disp: 3 each, Rfl: 3   Chlorphen-PE-Acetaminophen  4-10-325 MG TABS, One tab po twice daily prn, Disp: 20 tablet, Rfl: 0   docusate sodium  (COLACE) 250 MG capsule, Take 1 capsule (250 mg total) by mouth daily., Disp: 10 capsule, Rfl: 0   glucose blood test strip, Use as instructed, Disp: 50 each, Rfl: 12   linaclotide  (LINZESS ) 145 MCG CAPS capsule, Take 1 capsule (145 mcg total) by mouth daily before breakfast. TAKE 1 CAPSULE BY MOUTH DAILY BEFORE BREAKFAST., Disp: 90 capsule, Rfl: 1   olmesartan -hydrochlorothiazide (BENICAR  HCT) 20-12.5 MG tablet, Take 1 tablet by mouth daily., Disp: 90 tablet, Rfl: 2   Olopatadine  HCl (  PATADAY  OP), Place 1 drop into both eyes daily as needed (allergies)., Disp: , Rfl:    Olopatadine  HCl 0.2 % SOLN, Insert one gtt OU twice daily, Disp: 5 mL, Rfl: 1   ondansetron  (ZOFRAN ) 4 MG tablet, Take 1 tablet (4 mg total) by mouth every 8 (eight) hours as needed for nausea or vomiting., Disp: 20 tablet, Rfl: 0   tirzepatide  (MOUNJARO ) 10 MG/0.5ML Pen, Inject 10 mg into the skin once a week., Disp: 2 mL, Rfl: 2   tirzepatide  (MOUNJARO ) 7.5 MG/0.5ML Pen, Inject 7.5 mg into the skin once a week., Disp: 2  mL, Rfl: 1   traMADol  (ULTRAM ) 50 MG tablet, Take 1 tablet (50 mg total) by mouth 3 (three) times daily when necessary, Disp: 30 tablet, Rfl: 0   No Known Allergies    The patient states she uses none for birth control. No LMP recorded.. Negative for Dysmenorrhea. Negative for: breast discharge, breast lump(s), breast pain and breast self exam. Associated symptoms include abnormal vaginal bleeding. Pertinent negatives include abnormal bleeding (hematology), anxiety, decreased libido, depression, difficulty falling sleep, dyspareunia, history of infertility, nocturia, sexual dysfunction, sleep disturbances, urinary incontinence, urinary urgency, vaginal discharge and vaginal itching. Diet regular.The patient states her exercise level is  intermittent.  . The patient's tobacco use is: Tobacco Use History[1]. She has been exposed to passive smoke. The patient's alcohol use is:  Social History   Substance and Sexual Activity  Alcohol Use No    Review of Systems  Constitutional: Negative.   HENT: Negative.    Eyes: Negative.  Negative for blurred vision.  Respiratory: Negative.    Cardiovascular: Negative.  Negative for chest pain.  Gastrointestinal: Negative.   Endocrine: Negative.   Genitourinary: Negative.   Musculoskeletal: Negative.   Skin: Negative.   Allergic/Immunologic: Negative.   Neurological: Negative.   Hematological: Negative.   Psychiatric/Behavioral: Negative.       Today's Vitals   02/15/24 1056  BP: 110/80  Pulse: 80  Temp: 98.3 F (36.8 C)  SpO2: 98%  Weight: 270 lb 9.6 oz (122.7 kg)  Height: 5' (1.524 m)   Body mass index is 52.85 kg/m.  Wt Readings from Last 3 Encounters:  02/15/24 270 lb 9.6 oz (122.7 kg)  12/14/23 276 lb 3.2 oz (125.3 kg)  10/04/23 286 lb (129.7 kg)     Objective:  Physical Exam Vitals and nursing note reviewed.  Constitutional:      Appearance: Normal appearance. She is obese.  HENT:     Head: Normocephalic and atraumatic.      Right Ear: Tympanic membrane, ear canal and external ear normal.     Left Ear: Tympanic membrane, ear canal and external ear normal.     Nose: Nose normal.     Mouth/Throat:     Mouth: Mucous membranes are moist.     Pharynx: Oropharynx is clear.  Eyes:     Extraocular Movements: Extraocular movements intact.     Conjunctiva/sclera: Conjunctivae normal.     Pupils: Pupils are equal, round, and reactive to light.  Cardiovascular:     Rate and Rhythm: Normal rate and regular rhythm.     Pulses:          Dorsalis pedis pulses are 3+ on the right side and 3+ on the left side.     Heart sounds: Normal heart sounds.  Pulmonary:     Effort: Pulmonary effort is normal.     Breath sounds: Normal breath sounds.  Chest:  Breasts:  Tanner Score is 5.     Right: Normal.     Left: Normal.  Abdominal:     General: Bowel sounds are normal.     Palpations: Abdomen is soft.     Comments: Obese, difficult to assess organomegaly  Genitourinary:    Comments: deferred Musculoskeletal:        General: Normal range of motion.     Cervical back: Normal range of motion and neck supple.  Feet:     Right foot:     Protective Sensation: 5 sites tested.  5 sites sensed.     Skin integrity: Skin integrity normal.     Toenail Condition: Right toenails are normal.     Left foot:     Protective Sensation: 5 sites tested.  5 sites sensed.     Skin integrity: Skin integrity normal.     Toenail Condition: Left toenails are normal.     Comments: Onycholysis on left great toe Skin:    General: Skin is warm and dry.  Neurological:     General: No focal deficit present.     Mental Status: She is alert and oriented to person, place, and time.  Psychiatric:        Mood and Affect: Mood normal.        Behavior: Behavior normal.         Assessment And Plan:     Encounter for annual health examination Assessment & Plan: A full exam was performed.  Importance of monthly self breast exams was discussed  with the patient.  She is advised to get 30-45 minutes of regular exercise, no less than four to five days per week. Both weight-bearing and aerobic exercises are recommended.  She is advised to follow a healthy diet with at least six fruits/veggies per day, decrease intake of red meat and other saturated fats and to increase fish intake to twice weekly.  Meats/fish should not be fried -- baked, boiled or broiled is preferable. It is also important to cut back on your sugar intake.  Be sure to read labels - try to avoid anything with added sugar, high fructose corn syrup or other sweeteners.  If you must use a sweetener, you can try stevia or monkfruit.  It is also important to avoid artificially sweetened foods/beverages and diet drinks. Lastly, wear SPF 50 sunscreen on exposed skin and when in direct sunlight for an extended period of time.  Be sure to avoid fast food restaurants and aim for at least 60 ounces of water daily.      Orders: -     CBC -     CMP14+EGFR -     Lipid panel -     Hemoglobin A1c -     TSH  Type 2 diabetes mellitus in patient with obesity [facility]) Assessment & Plan: Chronic, diabetic foot exam was performed.  Blood sugars well controlled.  Weight loss of 16 pounds since August. Current medication Mounjaro  7.5 mg. Discussed increasing dosage for weight loss and glycemic control. - Increased Mounjaro  to 10 mg. - She is encouraged to aim for at least 150 minutes of exercise per week, including strength training.  - I DISCUSSED WITH THE PATIENT AT LENGTH REGARDING THE GOALS OF GLYCEMIC CONTROL AND POSSIBLE LONG-TERM COMPLICATIONS.  I  ALSO STRESSED THE IMPORTANCE OF COMPLIANCE WITH HOME GLUCOSE MONITORING, DIETARY RESTRICTIONS INCLUDING AVOIDANCE OF SUGARY DRINKS/PROCESSED FOODS,  ALONG WITH REGULAR EXERCISE.  I  ALSO STRESSED THE IMPORTANCE OF ANNUAL EYE EXAMS,  SELF FOOT CARE AND COMPLIANCE WITH OFFICE VISITS.    Essential hypertension, benign Assessment & Plan: Chronic,  controlled. Goal BP <130/80.  EKG performed, NSR w/ low voltage. Blood pressure 110/80 mmHg. Current medications olmesartan  and hydrochlorothiazide. Discussed exercise for blood pressure control. - Continue olmesartan  and hydrochlorothiazide. - Encouraged regular exercise. - Follow low sodium diet.   Orders: -     POCT urinalysis dipstick -     Microalbumin / creatinine urine ratio -     EKG 12-Lead  Hypercalcemia -     VITAMIN D  25 Hydroxy (Vit-D Deficiency, Fractures) -     Protein electrophoresis, serum  Other orders -     Mounjaro ; Inject 10 mg into the skin once a week.  Dispense: 2 mL; Refill: 2    Return for 1 year physical, 3 month dm check. Patient was given opportunity to ask questions. Patient verbalized understanding of the plan and was able to repeat key elements of the plan. All questions were answered to their satisfaction.   I, Evelyn LOISE Slocumb, MD, have reviewed all documentation for this visit. The documentation on 02/15/2024 for the exam, diagnosis, procedures, and orders are all accurate and complete.      [1]  Social History Tobacco Use  Smoking Status Never  Smokeless Tobacco Never

## 2024-02-17 LAB — PROTEIN ELECTROPHORESIS, SERUM
A/G Ratio: 0.9 (ref 0.7–1.7)
Albumin ELP: 3.5 g/dL (ref 2.9–4.4)
Alpha 1: 0.3 g/dL (ref 0.0–0.4)
Alpha 2: 0.7 g/dL (ref 0.4–1.0)
Beta: 1.1 g/dL (ref 0.7–1.3)
Gamma Globulin: 1.5 g/dL (ref 0.4–1.8)
Globulin, Total: 3.7 g/dL (ref 2.2–3.9)

## 2024-02-17 LAB — CBC
Hematocrit: 36.1 % (ref 34.0–46.6)
Hemoglobin: 11 g/dL — ABNORMAL LOW (ref 11.1–15.9)
MCH: 23.9 pg — ABNORMAL LOW (ref 26.6–33.0)
MCHC: 30.5 g/dL — ABNORMAL LOW (ref 31.5–35.7)
MCV: 79 fL (ref 79–97)
Platelets: 410 x10E3/uL (ref 150–450)
RBC: 4.6 x10E6/uL (ref 3.77–5.28)
RDW: 15 % (ref 11.7–15.4)
WBC: 7.3 x10E3/uL (ref 3.4–10.8)

## 2024-02-17 LAB — CMP14+EGFR
ALT: 13 IU/L (ref 0–32)
AST: 17 IU/L (ref 0–40)
Albumin: 4.2 g/dL (ref 3.8–4.9)
Alkaline Phosphatase: 78 IU/L (ref 49–135)
BUN/Creatinine Ratio: 31 — ABNORMAL HIGH (ref 9–23)
BUN: 22 mg/dL (ref 6–24)
Bilirubin Total: 0.3 mg/dL (ref 0.0–1.2)
CO2: 21 mmol/L (ref 20–29)
Calcium: 10.3 mg/dL — ABNORMAL HIGH (ref 8.7–10.2)
Chloride: 102 mmol/L (ref 96–106)
Creatinine, Ser: 0.7 mg/dL (ref 0.57–1.00)
Globulin, Total: 3 g/dL (ref 1.5–4.5)
Glucose: 79 mg/dL (ref 70–99)
Potassium: 4.2 mmol/L (ref 3.5–5.2)
Sodium: 138 mmol/L (ref 134–144)
Total Protein: 7.2 g/dL (ref 6.0–8.5)
eGFR: 105 mL/min/1.73

## 2024-02-17 LAB — LIPID PANEL
Chol/HDL Ratio: 2 ratio (ref 0.0–4.4)
Cholesterol, Total: 160 mg/dL (ref 100–199)
HDL: 81 mg/dL
LDL Chol Calc (NIH): 69 mg/dL (ref 0–99)
Triglycerides: 49 mg/dL (ref 0–149)
VLDL Cholesterol Cal: 10 mg/dL (ref 5–40)

## 2024-02-17 LAB — HEMOGLOBIN A1C
Est. average glucose Bld gHb Est-mCnc: 103 mg/dL
Hgb A1c MFr Bld: 5.2 % (ref 4.8–5.6)

## 2024-02-17 LAB — MICROALBUMIN / CREATININE URINE RATIO
Creatinine, Urine: 53.9 mg/dL
Microalb/Creat Ratio: <6 mg/g{creat} (ref 0–29)
Microalbumin, Urine: <3 ug/mL

## 2024-02-17 LAB — TSH: TSH: 0.616 u[IU]/mL (ref 0.450–4.500)

## 2024-02-17 LAB — VITAMIN D 25 HYDROXY (VIT D DEFICIENCY, FRACTURES): Vit D, 25-Hydroxy: 31.6 ng/mL (ref 30.0–100.0)

## 2024-02-20 ENCOUNTER — Encounter: Payer: Self-pay | Admitting: Internal Medicine

## 2024-02-22 ENCOUNTER — Other Ambulatory Visit: Payer: Self-pay | Admitting: Internal Medicine

## 2024-05-16 ENCOUNTER — Ambulatory Visit: Payer: Self-pay | Admitting: Internal Medicine

## 2025-02-15 ENCOUNTER — Encounter: Payer: Self-pay | Admitting: Internal Medicine
